# Patient Record
Sex: Male | Born: 1935 | Hispanic: Yes | Marital: Married | Smoking: Never smoker
Health system: Southern US, Community
[De-identification: ages and names within clinical notes are randomized; demographics above are authoritative.]

## PROBLEM LIST (undated history)

## (undated) DIAGNOSIS — K219 Gastro-esophageal reflux disease without esophagitis: Secondary | ICD-10-CM

## (undated) DIAGNOSIS — I471 Supraventricular tachycardia, unspecified: Secondary | ICD-10-CM

## (undated) DIAGNOSIS — K859 Acute pancreatitis without necrosis or infection, unspecified: Secondary | ICD-10-CM

## (undated) DIAGNOSIS — I429 Cardiomyopathy, unspecified: Secondary | ICD-10-CM

## (undated) DIAGNOSIS — I2510 Atherosclerotic heart disease of native coronary artery without angina pectoris: Secondary | ICD-10-CM

## (undated) DIAGNOSIS — I10 Essential (primary) hypertension: Secondary | ICD-10-CM

## (undated) DIAGNOSIS — I77810 Thoracic aortic ectasia: Secondary | ICD-10-CM

## (undated) DIAGNOSIS — I4510 Unspecified right bundle-branch block: Secondary | ICD-10-CM

## (undated) DIAGNOSIS — I714 Abdominal aortic aneurysm, without rupture, unspecified: Secondary | ICD-10-CM

## (undated) DIAGNOSIS — I951 Orthostatic hypotension: Secondary | ICD-10-CM

## (undated) DIAGNOSIS — K8050 Calculus of bile duct without cholangitis or cholecystitis without obstruction: Secondary | ICD-10-CM

## (undated) HISTORY — DX: Unspecified right bundle-branch block: I45.10

## (undated) HISTORY — DX: Abdominal aortic aneurysm, without rupture: I71.4

## (undated) HISTORY — DX: Cardiomyopathy, unspecified: I42.9

## (undated) HISTORY — DX: Calculus of bile duct without cholangitis or cholecystitis without obstruction: K80.50

## (undated) HISTORY — DX: Thoracic aortic ectasia: I77.810

## (undated) HISTORY — DX: Atherosclerotic heart disease of native coronary artery without angina pectoris: I25.10

## (undated) HISTORY — DX: Orthostatic hypotension: I95.1

## (undated) HISTORY — DX: Supraventricular tachycardia, unspecified: I47.10

## (undated) HISTORY — DX: Abdominal aortic aneurysm, without rupture, unspecified: I71.40

## (undated) HISTORY — DX: Acute pancreatitis without necrosis or infection, unspecified: K85.90

## (undated) HISTORY — DX: Supraventricular tachycardia: I47.1

---

## 2021-02-20 ENCOUNTER — Emergency Department
Admission: EM | Admit: 2021-02-20 | Discharge: 2021-02-20 | Disposition: A | Discharge status: Home / Self Care | Payer: Self-pay | Attending: Emergency Medicine | Admitting: Emergency Medicine

## 2021-02-20 ENCOUNTER — Other Ambulatory Visit: Payer: Self-pay

## 2021-02-20 ENCOUNTER — Emergency Department: Payer: Self-pay

## 2021-02-20 DIAGNOSIS — R109 Unspecified abdominal pain: Secondary | ICD-10-CM

## 2021-02-20 DIAGNOSIS — I10 Essential (primary) hypertension: Secondary | ICD-10-CM | POA: Insufficient documentation

## 2021-02-20 DIAGNOSIS — K2970 Gastritis, unspecified, without bleeding: Secondary | ICD-10-CM | POA: Insufficient documentation

## 2021-02-20 HISTORY — DX: Essential (primary) hypertension: I10

## 2021-02-20 LAB — CBC WITH DIFFERENTIAL/PLATELET
Abs Immature Granulocytes: 0.02 10*3/uL (ref 0.00–0.07)
Basophils Absolute: 0 10*3/uL (ref 0.0–0.1)
Basophils Relative: 1 %
Eosinophils Absolute: 0.2 10*3/uL (ref 0.0–0.5)
Eosinophils Relative: 3 %
HCT: 37.9 % — ABNORMAL LOW (ref 39.0–52.0)
Hemoglobin: 13.2 g/dL (ref 13.0–17.0)
Immature Granulocytes: 0 %
Lymphocytes Relative: 29 %
Lymphs Abs: 1.9 10*3/uL (ref 0.7–4.0)
MCH: 29.8 pg (ref 26.0–34.0)
MCHC: 34.8 g/dL (ref 30.0–36.0)
MCV: 85.6 fL (ref 80.0–100.0)
Monocytes Absolute: 0.4 10*3/uL (ref 0.1–1.0)
Monocytes Relative: 6 %
Neutro Abs: 3.9 10*3/uL (ref 1.7–7.7)
Neutrophils Relative %: 61 %
Platelets: 144 10*3/uL — ABNORMAL LOW (ref 150–400)
RBC: 4.43 MIL/uL (ref 4.22–5.81)
RDW: 12.2 % (ref 11.5–15.5)
WBC: 6.4 10*3/uL (ref 4.0–10.5)
nRBC: 0 % (ref 0.0–0.2)

## 2021-02-20 LAB — URINALYSIS, COMPLETE (UACMP) WITH MICROSCOPIC
Bacteria, UA: NONE SEEN
Bilirubin Urine: NEGATIVE
Glucose, UA: NEGATIVE mg/dL
Hgb urine dipstick: NEGATIVE
Ketones, ur: NEGATIVE mg/dL
Leukocytes,Ua: NEGATIVE
Nitrite: NEGATIVE
Protein, ur: NEGATIVE mg/dL
Specific Gravity, Urine: 1.017 (ref 1.005–1.030)
pH: 8 (ref 5.0–8.0)

## 2021-02-20 LAB — COMPREHENSIVE METABOLIC PANEL
ALT: 23 U/L (ref 0–44)
AST: 46 U/L — ABNORMAL HIGH (ref 15–41)
Albumin: 3.5 g/dL (ref 3.5–5.0)
Alkaline Phosphatase: 106 U/L (ref 38–126)
Anion gap: 11 (ref 5–15)
BUN: 21 mg/dL (ref 8–23)
CO2: 24 mmol/L (ref 22–32)
Calcium: 8.6 mg/dL — ABNORMAL LOW (ref 8.9–10.3)
Chloride: 103 mmol/L (ref 98–111)
Creatinine, Ser: 1.17 mg/dL (ref 0.61–1.24)
GFR, Estimated: >60 mL/min (ref >60)
Glucose, Bld: 119 mg/dL — ABNORMAL HIGH (ref 70–99)
Potassium: 3.6 mmol/L (ref 3.5–5.1)
Sodium: 138 mmol/L (ref 135–145)
Total Bilirubin: 0.9 mg/dL (ref 0.3–1.2)
Total Protein: 7.3 g/dL (ref 6.5–8.1)

## 2021-02-20 LAB — LIPASE, BLOOD: Lipase: 80 U/L — ABNORMAL HIGH (ref 11–51)

## 2021-02-20 MED ORDER — SODIUM CHLORIDE 0.9 % IV SOLN: Freq: Once | INTRAVENOUS | Status: AC

## 2021-02-20 MED ORDER — MORPHINE SULFATE (PF) 4 MG/ML IV SOLN
4.0000 mg | Freq: Once | INTRAVENOUS | Status: AC
Start: 2021-02-20 — End: 2021-02-20
  Administered 2021-02-20: 4 mg via INTRAVENOUS
  Filled 2021-02-20: qty 1

## 2021-02-20 MED ORDER — ALUM & MAG HYDROXIDE-SIMETH 200-200-20 MG/5ML PO SUSP
30.0000 mL | Freq: Once | ORAL | Status: AC
  Administered 2021-02-20: 30 mL via ORAL
  Filled 2021-02-20: qty 30

## 2021-02-20 MED ORDER — FAMOTIDINE 20 MG PO TABS: 20.0000 mg | ORAL_TABLET | Freq: Every day | ORAL | 1 refills | Status: DC

## 2021-02-20 MED ORDER — IOHEXOL 300 MG/ML  SOLN
100.0000 mL | Freq: Once | INTRAMUSCULAR | Status: AC | PRN
  Administered 2021-02-20: 100 mL via INTRAVENOUS

## 2021-02-20 MED ORDER — LIDOCAINE VISCOUS HCL 2 % MT SOLN
15.0000 mL | Freq: Once | OROMUCOSAL | Status: AC
  Administered 2021-02-20: 15 mL via ORAL
  Filled 2021-02-20: qty 15

## 2021-02-20 MED ORDER — SUCRALFATE 1 G PO TABS: 1.0000 g | ORAL_TABLET | Freq: Four times a day (QID) | ORAL | 0 refills | Status: DC

## 2021-02-20 NOTE — ED Notes (Signed)
Reports feeling better after GI cocktail. Translator cart taken into room for MD to give update

## 2021-02-20 NOTE — Discharge Instructions (Addendum)
Please seek medical attention for any high fevers, chest pain, shortness of breath, change in behavior, persistent vomiting, bloody stool or any other new or concerning symptoms.  

## 2021-02-20 NOTE — ED Triage Notes (Signed)
Pt BIBA from home for abd pain x 3days. Pt denies N/V. Pt c/o constipation x last week. Pt hx of HTN, pt been out of meds x 1 month because he orders them from [place].

## 2021-02-20 NOTE — ED Notes (Signed)
Report given to Jessica, RN.

## 2021-02-20 NOTE — ED Provider Notes (Signed)
[facility] Emergency Department Provider Note  ____________________________________________   I have reviewed the triage vital signs and the nursing notes.   HISTORY  Chief Complaint Abdominal Pain   History limited by: Language [facility] Interpreter utilized   HPI Travis Gallagher is a 87 y.o. male who presents to the emergency department today because of concern for abdominal pain. The patient states that the pain has been present for the past three days. He states it is located in the upper abdomen. The patient also has felt like he has had worsening constipation as well over the past week. He does drink tea which will help with his constipation. This is something that he has dealt with in the past. The patient denies any vomiting. Denies any fevers.   Records reviewed. Per medical record review patient has a history of hypertension.   Past Medical History:  Diagnosis Date  . Hypertension     There are no problems to display for this patient.   History reviewed. No pertinent surgical history.  Prior to Admission medications   Not on File    Allergies Patient has no known allergies.  History reviewed. No pertinent family history.  Social History Social History   Tobacco Use  . Smoking status: Never Smoker  . Smokeless tobacco: Never Used  Substance Use Topics  . Alcohol use: Not Currently  . Drug use: Not Currently    Review of Systems Constitutional: No fever/chills Eyes: No visual changes. ENT: No sore throat. Cardiovascular: Denies chest pain. Respiratory: Denies shortness of breath. Gastrointestinal: Positive for abdominal pain.  Genitourinary: Negative for dysuria. Musculoskeletal: Negative for back pain. Skin: Negative for rash. Neurological: Negative for headaches, focal weakness or numbness.  ____________________________________________   PHYSICAL EXAM:  VITAL SIGNS: ED Triage Vitals  Enc Vitals  Group     BP 02/20/21 1759 (!) 192/76     Pulse Rate 02/20/21 1759 76     Resp 02/20/21 1759 18     Temp 02/20/21 1759 98.7 F (37.1 C)     Temp Source 02/20/21 1759 Oral     SpO2 02/20/21 1759 97 %     Weight 02/20/21 1756 140 lb (63.5 kg)     Height 02/20/21 1756 5\' 7"  (1.702 m)     Head Circumference --      Peak Flow --      Pain Score 02/20/21 1811 7   Constitutional: Alert and oriented.  Eyes: Conjunctivae are normal.  ENT      Head: Normocephalic and atraumatic.      Nose: No congestion/rhinnorhea.      Mouth/Throat: Mucous membranes are moist.      Neck: No stridor. Hematological/Lymphatic/Immunilogical: No cervical lymphadenopathy. Cardiovascular: Normal rate, regular rhythm.  No murmurs, rubs, or gallops. \ Respiratory: Normal respiratory effort without tachypnea nor retractions. Breath sounds are clear and equal bilaterally. No wheezes/rales/rhonchi. Gastrointestinal: Soft and tender to palpation in the epigastric region.  Genitourinary: Deferred Musculoskeletal: Normal range of motion in all extremities. No lower extremity edema. Neurologic:  Normal speech and language. No gross focal neurologic deficits are appreciated.  Skin:  Skin is warm, dry and intact. No rash noted. Psychiatric: Mood and affect are normal. Speech and behavior are normal. Patient exhibits appropriate insight and judgment.  ____________________________________________    LABS (pertinent positives/negatives)  CBC wbc 6.4, hgb 13.2, plt 144 Lipase 80 CMP wnl except glu 119, ca 8.6, ast 46 UA clear, not consistent with infection ____________________________________________  EKG  I, Phineas Semen, attending physician, personally viewed and interpreted this EKG  EKG Time: 1754 Rate: 79 Rhythm: sinus rhythm Axis: normal Intervals: qtc 483 QRS: LVH, q waves v1, v2 ST changes: no st elevation Impression: abnormal ekg ____________________________________________    RADIOLOGY  CT  abd/pel Findings concerning for gastritis. Bilary duct dilatation.  ____________________________________________   PROCEDURES  Procedures  ____________________________________________   INITIAL IMPRESSION / ASSESSMENT AND PLAN / ED COURSE  Pertinent labs & imaging results that were available during my care of the patient were reviewed by me and considered in my medical decision making (see chart for details).   Patient presented to the emergency department today because of concerns for abdominal pain for the past 3 days.  On exam he had some mild tenderness in the upper abdomen.  Blood work without any concerning leukocytosis, elevation of hepatic function panel.  Mild elevation of lipase.  CT scan does show some findings consistent with gastritis.  Also showed biliary ductal dilatation however given lack of elevation hepatic function panel I doubt that this is an acute finding.  Patient did feel better after GI cocktail.  At this point I do think gastritis likely.  Will plan on discharging home with medications as well as dietary guidelines.  ____________________________________________   FINAL CLINICAL IMPRESSION(S) / ED DIAGNOSES  Final diagnoses:  Abdominal pain, unspecified abdominal location  Gastritis, presence of bleeding unspecified, unspecified chronicity, unspecified gastritis type     Note: This dictation was prepared with Dragon dictation. Any transcriptional errors that result from this process are unintentional     Phineas Semen, MD 02/20/21 [phone]

## 2021-02-21 ENCOUNTER — Emergency Department: Payer: Self-pay

## 2021-02-21 ENCOUNTER — Emergency Department
Admission: EM | Admit: 2021-02-21 | Discharge: 2021-02-22 | Disposition: A | Discharge status: Home / Self Care | Payer: Self-pay | Attending: Emergency Medicine | Admitting: Emergency Medicine

## 2021-02-21 ENCOUNTER — Other Ambulatory Visit: Payer: Self-pay

## 2021-02-21 DIAGNOSIS — R1013 Epigastric pain: Secondary | ICD-10-CM

## 2021-02-21 DIAGNOSIS — R0602 Shortness of breath: Secondary | ICD-10-CM | POA: Insufficient documentation

## 2021-02-21 DIAGNOSIS — Z20822 Contact with and (suspected) exposure to covid-19: Secondary | ICD-10-CM | POA: Insufficient documentation

## 2021-02-21 DIAGNOSIS — K8309 Other cholangitis: Secondary | ICD-10-CM | POA: Insufficient documentation

## 2021-02-21 DIAGNOSIS — A419 Sepsis, unspecified organism: Secondary | ICD-10-CM | POA: Insufficient documentation

## 2021-02-21 DIAGNOSIS — K859 Acute pancreatitis without necrosis or infection, unspecified: Secondary | ICD-10-CM

## 2021-02-21 DIAGNOSIS — I10 Essential (primary) hypertension: Secondary | ICD-10-CM | POA: Insufficient documentation

## 2021-02-21 LAB — CBC WITH DIFFERENTIAL/PLATELET
Abs Immature Granulocytes: 0.04 10*3/uL (ref 0.00–0.07)
Basophils Absolute: 0 10*3/uL (ref 0.0–0.1)
Basophils Relative: 0 %
Eosinophils Absolute: 0 10*3/uL (ref 0.0–0.5)
Eosinophils Relative: 0 %
HCT: 41.4 % (ref 39.0–52.0)
Hemoglobin: 14.4 g/dL (ref 13.0–17.0)
Immature Granulocytes: 0 %
Lymphocytes Relative: 4 %
Lymphs Abs: 0.6 10*3/uL — ABNORMAL LOW (ref 0.7–4.0)
MCH: 29.7 pg (ref 26.0–34.0)
MCHC: 34.8 g/dL (ref 30.0–36.0)
MCV: 85.4 fL (ref 80.0–100.0)
Monocytes Absolute: 0.5 10*3/uL (ref 0.1–1.0)
Monocytes Relative: 4 %
Neutro Abs: 13.4 10*3/uL — ABNORMAL HIGH (ref 1.7–7.7)
Neutrophils Relative %: 92 %
Platelets: 136 10*3/uL — ABNORMAL LOW (ref 150–400)
RBC: 4.85 MIL/uL (ref 4.22–5.81)
RDW: 12.4 % (ref 11.5–15.5)
WBC: 14.7 10*3/uL — ABNORMAL HIGH (ref 4.0–10.5)
nRBC: 0 % (ref 0.0–0.2)

## 2021-02-21 LAB — LIPASE, BLOOD: Lipase: 2952 U/L — ABNORMAL HIGH (ref 11–51)

## 2021-02-21 LAB — COMPREHENSIVE METABOLIC PANEL
ALT: 185 U/L — ABNORMAL HIGH (ref 0–44)
AST: 210 U/L — ABNORMAL HIGH (ref 15–41)
Albumin: 3.5 g/dL (ref 3.5–5.0)
Alkaline Phosphatase: 209 U/L — ABNORMAL HIGH (ref 38–126)
Anion gap: 13 (ref 5–15)
BUN: 21 mg/dL (ref 8–23)
CO2: 19 mmol/L — ABNORMAL LOW (ref 22–32)
Calcium: 8.6 mg/dL — ABNORMAL LOW (ref 8.9–10.3)
Chloride: 105 mmol/L (ref 98–111)
Creatinine, Ser: 1.05 mg/dL (ref 0.61–1.24)
GFR, Estimated: >60 mL/min (ref >60)
Glucose, Bld: 136 mg/dL — ABNORMAL HIGH (ref 70–99)
Potassium: 3.7 mmol/L (ref 3.5–5.1)
Sodium: 137 mmol/L (ref 135–145)
Total Bilirubin: 6.3 mg/dL — ABNORMAL HIGH (ref 0.3–1.2)
Total Protein: 7.7 g/dL (ref 6.5–8.1)

## 2021-02-21 LAB — TROPONIN I (HIGH SENSITIVITY)
Troponin I (High Sensitivity): 29 ng/L — ABNORMAL HIGH (ref <18)
Troponin I (High Sensitivity): 32 ng/L — ABNORMAL HIGH (ref <18)

## 2021-02-21 LAB — RESP PANEL BY RT-PCR (FLU A&B, COVID) ARPGX2
Influenza A by PCR: NEGATIVE
Influenza B by PCR: NEGATIVE
SARS Coronavirus 2 by RT PCR: NEGATIVE

## 2021-02-21 LAB — LACTIC ACID, PLASMA
Lactic Acid, Venous: 2.4 mmol/L (ref 0.5–1.9)
Lactic Acid, Venous: 1.1 mmol/L (ref 0.5–1.9)

## 2021-02-21 MED ORDER — SODIUM CHLORIDE 0.9 % IV BOLUS
1000.0000 mL | Freq: Once | INTRAVENOUS | Status: AC
  Administered 2021-02-21: 1000 mL via INTRAVENOUS

## 2021-02-21 MED ORDER — METRONIDAZOLE IN NACL 5-0.79 MG/ML-% IV SOLN
500.0000 mg | Freq: Once | INTRAVENOUS | Status: AC
  Administered 2021-02-21: 500 mg via INTRAVENOUS
  Filled 2021-02-21: qty 100

## 2021-02-21 MED ORDER — ACETAMINOPHEN 500 MG PO TABS: 1000.0000 mg | ORAL_TABLET | Freq: Once | ORAL | Status: DC

## 2021-02-21 MED ORDER — ACETAMINOPHEN 10 MG/ML IV SOLN
1000.0000 mg | Freq: Four times a day (QID) | INTRAVENOUS | Status: DC
  Administered 2021-02-21: 1000 mg via INTRAVENOUS
  Filled 2021-02-21 (×4): qty 100

## 2021-02-21 MED ORDER — FENTANYL CITRATE (PF) 100 MCG/2ML IJ SOLN
50.0000 ug | Freq: Once | INTRAMUSCULAR | Status: AC
  Administered 2021-02-21: 50 ug via INTRAVENOUS
  Filled 2021-02-21: qty 2

## 2021-02-21 MED ORDER — IOHEXOL 350 MG/ML SOLN
75.0000 mL | Freq: Once | INTRAVENOUS | Status: AC | PRN
  Administered 2021-02-21: 75 mL via INTRAVENOUS

## 2021-02-21 MED ORDER — HYDROMORPHONE HCL 1 MG/ML IJ SOLN
0.5000 mg | Freq: Once | INTRAMUSCULAR | Status: AC
  Administered 2021-02-21: 0.5 mg via INTRAVENOUS
  Filled 2021-02-21: qty 1

## 2021-02-21 MED ORDER — VANCOMYCIN HCL IN DEXTROSE 1-5 GM/200ML-% IV SOLN
1000.0000 mg | Freq: Once | INTRAVENOUS | Status: AC
  Administered 2021-02-21: 1000 mg via INTRAVENOUS
  Filled 2021-02-21: qty 200

## 2021-02-21 MED ORDER — SODIUM CHLORIDE 0.9 % IV SOLN
2.0000 g | Freq: Once | INTRAVENOUS | Status: AC
  Administered 2021-02-21: 2 g via INTRAVENOUS
  Filled 2021-02-21: qty 2

## 2021-02-21 MED ORDER — ONDANSETRON HCL 4 MG/2ML IJ SOLN
4.0000 mg | Freq: Once | INTRAMUSCULAR | Status: AC
  Administered 2021-02-21: 4 mg via INTRAVENOUS
  Filled 2021-02-21: qty 2

## 2021-02-21 NOTE — Progress Notes (Signed)
CODE SEPSIS - PHARMACY COMMUNICATION  **Broad Spectrum Antibiotics should be administered within 1 hour of Sepsis diagnosis**  Time Code Sepsis Called/Page Received: 2150  Antibiotics Ordered: cefepime, vancomycin, metronidazole  Time of 1st antibiotic administration: 2159   Travis Gallagher 02/21/2021  9:51 PM

## 2021-02-21 NOTE — Consult Note (Signed)
PHARMACY -  BRIEF ANTIBIOTIC NOTE   Pharmacy has received consult(s) for cefepime and vancomycin from an ED provider. Patient is also ordered metronidazole. The patient's profile has been reviewed for ht/wt/allergies/indication/available labs.    One time order(s) placed for  --Vancomycin 1 g IV  --Cefepime 2 g IV   Further antibiotics/pharmacy consults should be ordered by admitting physician if indicated.                       Thank you, Tressie Ellis 02/21/2021  9:50 PM

## 2021-02-21 NOTE — ED Triage Notes (Signed)
Pt was seen here yesterday for abd pain and pt states the pain is a lot worse today with radiation into his chest, pt states he also feels short of breath. Pt was d/c home with meds last night but not any better. Pt feels nauseous.

## 2021-02-21 NOTE — ED Notes (Signed)
US at bedside

## 2021-02-21 NOTE — ED Notes (Signed)
2hr repeat lactic drawn and sent to lab at this time

## 2021-02-21 NOTE — ED Notes (Signed)
Pt to xray

## 2021-02-21 NOTE — ED Notes (Signed)
Pt placed on 2L [place] following fentanyl administration when oxygen saturation dipped down to 88 percent on room air. O2 sat increased to 95 on 2L [place].

## 2021-02-21 NOTE — ED Provider Notes (Signed)
Travis Gallagher  ____________________________________________   Event Date/Time   First MD Initiated Contact with Patient 02/21/21 1959     (approximate)  I have reviewed the triage vital signs and the nursing notes.   HISTORY  Chief Complaint Abdominal Pain    HPI Travis Gallagher is a 87 y.o. male with hypertension who comes in with abdominal pain.  Patient reports that the pain in his upper abdomen is getting worse, severe, constant, not better with the medications he was prescribed including ranitidine and Carafate.  Patient does report the pain going up into his chest as well as some shortness of breath with it as well.  Daughter states that on his way here he was shaking and she was not sure that was a reaction to the medication.  She denies that he is shaking any longer.  Patient also reports some shortness of breath associated with the abdominal pain.   On review of records patient was seen yesterday with abdominal pain.  CT scan was concerning for gastritis and also showed some biliary duct dilation however given lack of elevation of his hepatic function panel did not think it was an acute finding.  Patient felt better after GI cocktail and was discharged home.          Past Medical History:  Diagnosis Date  . Hypertension     There are no problems to display for this patient.   No past surgical history on file.  Prior to Admission medications   Medication Sig Start Date End Date Taking? Authorizing Provider  famotidine (PEPCID) 20 MG tablet Take 1 tablet (20 mg total) by mouth daily. 02/20/21 02/20/22  Phineas Semen, MD  sucralfate (CARAFATE) 1 g tablet Take 1 tablet (1 g total) by mouth 4 (four) times daily. 02/20/21   Phineas Semen, MD    Allergies Patient has no known allergies.  No family history on file.  Social History Social History   Tobacco Use  . Smoking status: Never Smoker   . Smokeless tobacco: Never Used  Substance Use Topics  . Alcohol use: Not Currently  . Drug use: Not Currently      Review of Systems Constitutional: Denies known fever Eyes: No visual changes. ENT: No sore throat. Cardiovascular: Positive chest pain Respiratory: Positive shortness of breath Gastrointestinal: Positive abdominal pain no nausea, no vomiting.  No diarrhea.  No constipation. Genitourinary: Negative for dysuria. Musculoskeletal: Negative for back pain. Skin: Negative for rash. Neurological: Negative for headaches, focal weakness or numbness. All other ROS negative ____________________________________________   PHYSICAL EXAM:  VITAL SIGNS: Blood pressure (!) 148/78, pulse (!) 109, temperature 99 F (37.2 C), resp. rate 18, height 5\' 7"  (1.702 m), weight 63.5 kg, SpO2 92 %.   Constitutional: Alert and oriented. Well appearing and in no acute distress. Eyes: Conjunctivae are normal. EOMI. Head: Atraumatic. Nose: No congestion/rhinnorhea. Mouth/Throat: Mucous membranes are moist.   Neck: No stridor. Trachea Midline. FROM Cardiovascular: Normal rate, regular rhythm. Grossly normal heart sounds.  Good peripheral circulation. Respiratory: Normal respiratory effort.  No retractions. Lungs CTAB. Gastrointestinal: Tender throughout his abdomen but mostly in the upper abdomen.. No distention. No abdominal bruits.  Musculoskeletal: No lower extremity tenderness nor edema.  No joint effusions. Neurologic:  Normal speech and language. No gross focal neurologic deficits are appreciated.  Skin:  Skin is warm, dry and intact. No rash noted. Psychiatric: Mood and affect are normal. Speech and behavior are normal. GU: Deferred  ____________________________________________   LABS (all labs ordered are listed, but only abnormal results are displayed)  Labs Reviewed  CBC WITH DIFFERENTIAL/PLATELET - Abnormal; Notable for the following components:      Result Value   WBC  14.7 (*)    Platelets 136 (*)    Neutro Abs 13.4 (*)    Lymphs Abs 0.6 (*)    All other components within normal limits  LACTIC ACID, PLASMA - Abnormal; Notable for the following components:   Lactic Acid, Venous 2.4 (*)    All other components within normal limits  RESP PANEL BY RT-PCR (FLU A&B, COVID) ARPGX2  CULTURE, BLOOD (ROUTINE X 2)  CULTURE, BLOOD (ROUTINE X 2)  URINE CULTURE  COMPREHENSIVE METABOLIC PANEL  LIPASE, BLOOD  PROCALCITONIN  PROCALCITONIN  LACTIC ACID, PLASMA  TROPONIN I (HIGH SENSITIVITY)  TROPONIN I (HIGH SENSITIVITY)   ____________________________________________   ED ECG REPORT I, Concha Se, the attending physician, personally viewed and interpreted this ECG.  Sinus tachycardia rate of 109, no ST elevation, T wave inversion aVL and V2 and V3 with a right bundle branch block ____________________________________________  RADIOLOGY I, Concha Se, personally viewed and evaluated these images (plain radiographs) as part of my medical decision making, as well as reviewing the written report by the radiologist.  ED MD interpretation: Possible left-sided opacity  Official radiology report(s): DG Chest 2 View  Result Date: 02/21/2021 CLINICAL DATA:  Worsening abdominal pain radiating into the chest. EXAM: CHEST - 2 VIEW COMPARISON:  CT abdomen pelvis February 20, 2021 FINDINGS: The heart size and mediastinal contours are within normal limits. Low lung volumes. Streaky left basilar opacities. No visible pleural effusion or pneumothorax. Thoracic spondylosis. Degenerative changes bilateral shoulders. IMPRESSION: Low lung volumes with streaky left basilar opacities, which may represent atelectasis or infiltrate. Electronically Signed   By: Maudry Mayhew MD   On: 02/21/2021 20:44    ____________________________________________   PROCEDURES  Procedure(s) performed (including Critical Care):  .1-3 Lead EKG Interpretation Performed by: Concha Se,  MD Authorized by: Concha Se, MD     Interpretation: abnormal     ECG rate:  100s    ECG rate assessment: tachycardic     Rhythm: sinus tachycardia     Ectopy: none     Conduction: normal   .Critical Care Performed by: Concha Se, MD Authorized by: Concha Se, MD   Critical care provider statement:    Critical care time (minutes):  45   Critical care was necessary to treat or prevent imminent or life-threatening deterioration of the following conditions:  Sepsis   Critical care was time spent personally by me on the following activities:  Discussions with consultants, evaluation of patient's response to treatment, examination of patient, ordering and performing treatments and interventions, ordering and review of laboratory studies, ordering and review of radiographic studies, pulse oximetry, re-evaluation of patient's condition, obtaining history from patient or surrogate and review of old charts     ____________________________________________   INITIAL IMPRESSION / ASSESSMENT AND PLAN / ED COURSE  Kenzie Alverto Sherlon Handing was evaluated in Emergency Department on 02/21/2021 for the symptoms described in the history of present illness. He was evaluated in the context of the global COVID-19 pandemic, which necessitated consideration that the patient might be at risk for infection with the SARS-CoV-2 virus that causes COVID-19. Institutional protocols and algorithms that pertain to the evaluation of patients at risk for COVID-19 are in a state of rapid change based on information  released by regulatory bodies including the CDC and federal and state organizations. These policies and algorithms were followed during the patient's care in the ED.    Patient comes in with tachycardia and possible temperature.  We rechecked his temperature multiple times and has ranged from 99-103 but I suspect he might have a fever given daughter describes what might sound like rigors.  Will get UA to  evaluate for UTI, chest x-ray to evaluate for pneumonia and COVID swab.  Suspect patient will need repeat CT imaging given patient now has fever.  We will give patient IV fluids and Zofran and fentanyl to help with pain.  We will keep patient on the cardiac monitor to evaluate for cardiac respiratory collapse  9:52 PM patient's repeat temperature is now 101.5 therefore sepsis alert was called given patient's elevated white count.  Will start broad-spectrum antibiotics given unclear source at this time.  Lab called stating that his T bili was elevated and this could be potentially the cause of his fever.  Patient had a CT scan that did show a mild dilation of the CBD that was favored to represent benign dilation and and yesterday his liver function tests were normal.  I suspect the patient could have a CBD stone that is causing his symptoms today.  I added on an ultrasound in case this will get done faster than the CT scan.  COVID swab is negative.  Patient was given some IV Dilaudid and IV Tylenol.  Ultrasound now shows 11 mm CBD with gallstones but no signs of choledocholithiasis.  However given the elevated T bili I am concerned about a retained stone.  Discussed the case with Dr. Servando Snare from GI who recommended be transferred to [facility] due to being unable to do ERCP tomorrow.  CT imaging was negative.  Still waiting potential transfer to [facility] for ERCP.  Incidental findings on CT imaging family will follow up with  Pt accepted by Dr. Leafy Half for [facility] transfer         ____________________________________________   FINAL CLINICAL IMPRESSION(S) / ED DIAGNOSES   Final diagnoses:  Epigastric abdominal pain  Sepsis, due to unspecified organism, unspecified whether acute organ dysfunction present (HCC)  Ascending cholangitis      MEDICATIONS GIVEN DURING THIS VISIT:  Medications  ceFEPIme (MAXIPIME) 2 g in sodium chloride 0.9 % 100 mL IVPB (has no administration in time range)   metroNIDAZOLE (FLAGYL) IVPB 500 mg (has no administration in time range)  vancomycin (VANCOCIN) IVPB 1000 mg/200 mL premix (has no administration in time range)  HYDROmorphone (DILAUDID) injection 0.5 mg (has no administration in time range)  acetaminophen (OFIRMEV) IV 1,000 mg (has no administration in time range)  sodium chloride 0.9 % bolus 1,000 mL (has no administration in time range)  sodium chloride 0.9 % bolus 1,000 mL (1,000 mLs Intravenous New Bag/Given 02/21/21 2038)  ondansetron (ZOFRAN) injection 4 mg (4 mg Intravenous Given 02/21/21 2038)  fentaNYL (SUBLIMAZE) injection 50 mcg (50 mcg Intravenous Given 02/21/21 2038)     ED Discharge Orders    None       Gallagher:  This document was prepared using Dragon voice recognition software and may include unintentional dictation errors.   Concha Se, MD 02/21/21 [phone]

## 2021-02-22 ENCOUNTER — Inpatient Hospital Stay (HOSPITAL_COMMUNITY)
Admission: AD | Admit: 2021-02-22 | Discharge: 2021-03-14 | DRG: 853 | Disposition: A | Discharge status: Home / Self Care | Payer: Self-pay | Source: Other Acute Inpatient Hospital | Attending: Internal Medicine | Admitting: Internal Medicine

## 2021-02-22 ENCOUNTER — Encounter (HOSPITAL_COMMUNITY): Payer: Self-pay | Admitting: Internal Medicine

## 2021-02-22 ENCOUNTER — Inpatient Hospital Stay (HOSPITAL_COMMUNITY): Payer: Self-pay

## 2021-02-22 DIAGNOSIS — R5381 Other malaise: Secondary | ICD-10-CM | POA: Diagnosis present

## 2021-02-22 DIAGNOSIS — A419 Sepsis, unspecified organism: Secondary | ICD-10-CM

## 2021-02-22 DIAGNOSIS — I5043 Acute on chronic combined systolic (congestive) and diastolic (congestive) heart failure: Secondary | ICD-10-CM | POA: Diagnosis present

## 2021-02-22 DIAGNOSIS — E8809 Other disorders of plasma-protein metabolism, not elsewhere classified: Secondary | ICD-10-CM | POA: Diagnosis present

## 2021-02-22 DIAGNOSIS — Z9889 Other specified postprocedural states: Secondary | ICD-10-CM

## 2021-02-22 DIAGNOSIS — J9811 Atelectasis: Secondary | ICD-10-CM | POA: Diagnosis not present

## 2021-02-22 DIAGNOSIS — R778 Other specified abnormalities of plasma proteins: Secondary | ICD-10-CM | POA: Diagnosis present

## 2021-02-22 DIAGNOSIS — Z79899 Other long term (current) drug therapy: Secondary | ICD-10-CM

## 2021-02-22 DIAGNOSIS — E43 Unspecified severe protein-calorie malnutrition: Secondary | ICD-10-CM | POA: Diagnosis present

## 2021-02-22 DIAGNOSIS — K9171 Accidental puncture and laceration of a digestive system organ or structure during a digestive system procedure: Secondary | ICD-10-CM | POA: Diagnosis not present

## 2021-02-22 DIAGNOSIS — I429 Cardiomyopathy, unspecified: Secondary | ICD-10-CM | POA: Diagnosis present

## 2021-02-22 DIAGNOSIS — K7460 Unspecified cirrhosis of liver: Secondary | ICD-10-CM | POA: Diagnosis present

## 2021-02-22 DIAGNOSIS — R109 Unspecified abdominal pain: Secondary | ICD-10-CM | POA: Diagnosis present

## 2021-02-22 DIAGNOSIS — I10 Essential (primary) hypertension: Secondary | ICD-10-CM | POA: Diagnosis present

## 2021-02-22 DIAGNOSIS — R6520 Severe sepsis without septic shock: Secondary | ICD-10-CM | POA: Diagnosis present

## 2021-02-22 DIAGNOSIS — I77810 Thoracic aortic ectasia: Secondary | ICD-10-CM | POA: Diagnosis present

## 2021-02-22 DIAGNOSIS — R7989 Other specified abnormal findings of blood chemistry: Secondary | ICD-10-CM | POA: Diagnosis present

## 2021-02-22 DIAGNOSIS — K82A1 Gangrene of gallbladder in cholecystitis: Secondary | ICD-10-CM | POA: Diagnosis present

## 2021-02-22 DIAGNOSIS — R110 Nausea: Secondary | ICD-10-CM | POA: Diagnosis present

## 2021-02-22 DIAGNOSIS — Z20822 Contact with and (suspected) exposure to covid-19: Secondary | ICD-10-CM | POA: Diagnosis present

## 2021-02-22 DIAGNOSIS — K851 Biliary acute pancreatitis without necrosis or infection: Secondary | ICD-10-CM | POA: Diagnosis present

## 2021-02-22 DIAGNOSIS — Y838 Other surgical procedures as the cause of abnormal reaction of the patient, or of later complication, without mention of misadventure at the time of the procedure: Secondary | ICD-10-CM | POA: Diagnosis not present

## 2021-02-22 DIAGNOSIS — J439 Emphysema, unspecified: Secondary | ICD-10-CM | POA: Diagnosis present

## 2021-02-22 DIAGNOSIS — I471 Supraventricular tachycardia: Secondary | ICD-10-CM | POA: Diagnosis not present

## 2021-02-22 DIAGNOSIS — I714 Abdominal aortic aneurysm, without rupture: Secondary | ICD-10-CM | POA: Diagnosis present

## 2021-02-22 DIAGNOSIS — I4510 Unspecified right bundle-branch block: Secondary | ICD-10-CM | POA: Diagnosis present

## 2021-02-22 DIAGNOSIS — R935 Abnormal findings on diagnostic imaging of other abdominal regions, including retroperitoneum: Secondary | ICD-10-CM

## 2021-02-22 DIAGNOSIS — K8309 Other cholangitis: Secondary | ICD-10-CM | POA: Diagnosis present

## 2021-02-22 DIAGNOSIS — Z87891 Personal history of nicotine dependence: Secondary | ICD-10-CM

## 2021-02-22 DIAGNOSIS — I110 Hypertensive heart disease with heart failure: Secondary | ICD-10-CM | POA: Diagnosis present

## 2021-02-22 DIAGNOSIS — K219 Gastro-esophageal reflux disease without esophagitis: Secondary | ICD-10-CM | POA: Diagnosis present

## 2021-02-22 DIAGNOSIS — N179 Acute kidney failure, unspecified: Secondary | ICD-10-CM | POA: Diagnosis not present

## 2021-02-22 DIAGNOSIS — K8020 Calculus of gallbladder without cholecystitis without obstruction: Secondary | ICD-10-CM

## 2021-02-22 DIAGNOSIS — K859 Acute pancreatitis without necrosis or infection, unspecified: Secondary | ICD-10-CM

## 2021-02-22 DIAGNOSIS — R140 Abdominal distension (gaseous): Secondary | ICD-10-CM

## 2021-02-22 DIAGNOSIS — I340 Nonrheumatic mitral (valve) insufficiency: Secondary | ICD-10-CM | POA: Diagnosis present

## 2021-02-22 DIAGNOSIS — H5462 Unqualified visual loss, left eye, normal vision right eye: Secondary | ICD-10-CM | POA: Diagnosis present

## 2021-02-22 DIAGNOSIS — Z6821 Body mass index (BMI) 21.0-21.9, adult: Secondary | ICD-10-CM

## 2021-02-22 DIAGNOSIS — Q446 Cystic disease of liver: Secondary | ICD-10-CM

## 2021-02-22 DIAGNOSIS — I723 Aneurysm of iliac artery: Secondary | ICD-10-CM | POA: Diagnosis present

## 2021-02-22 DIAGNOSIS — R0902 Hypoxemia: Secondary | ICD-10-CM | POA: Diagnosis present

## 2021-02-22 DIAGNOSIS — L03311 Cellulitis of abdominal wall: Secondary | ICD-10-CM | POA: Diagnosis not present

## 2021-02-22 DIAGNOSIS — K631 Perforation of intestine (nontraumatic): Secondary | ICD-10-CM

## 2021-02-22 DIAGNOSIS — K567 Ileus, unspecified: Secondary | ICD-10-CM

## 2021-02-22 DIAGNOSIS — A4151 Sepsis due to Escherichia coli [E. coli]: Principal | ICD-10-CM | POA: Diagnosis present

## 2021-02-22 HISTORY — DX: Gastro-esophageal reflux disease without esophagitis: K21.9

## 2021-02-22 LAB — BLOOD CULTURE ID PANEL (REFLEXED) - BCID2

## 2021-02-22 LAB — TROPONIN I (HIGH SENSITIVITY)
Troponin I (High Sensitivity): 174 ng/L (ref <18)
Troponin I (High Sensitivity): 112 ng/L (ref <18)

## 2021-02-22 LAB — COMPREHENSIVE METABOLIC PANEL
ALT: 125 U/L — ABNORMAL HIGH (ref 0–44)
AST: 102 U/L — ABNORMAL HIGH (ref 15–41)
Albumin: 2.8 g/dL — ABNORMAL LOW (ref 3.5–5.0)
Alkaline Phosphatase: 178 U/L — ABNORMAL HIGH (ref 38–126)
Anion gap: 7 (ref 5–15)
BUN: 18 mg/dL (ref 8–23)
CO2: 24 mmol/L (ref 22–32)
Calcium: 8 mg/dL — ABNORMAL LOW (ref 8.9–10.3)
Chloride: 107 mmol/L (ref 98–111)
Creatinine, Ser: 1.25 mg/dL — ABNORMAL HIGH (ref 0.61–1.24)
GFR, Estimated: 56 mL/min — ABNORMAL LOW (ref >60)
Glucose, Bld: 90 mg/dL (ref 70–99)
Potassium: 3.9 mmol/L (ref 3.5–5.1)
Sodium: 138 mmol/L (ref 135–145)
Total Bilirubin: 4.7 mg/dL — ABNORMAL HIGH (ref 0.3–1.2)
Total Protein: 6.1 g/dL — ABNORMAL LOW (ref 6.5–8.1)

## 2021-02-22 LAB — CBC WITH DIFFERENTIAL/PLATELET
Abs Immature Granulocytes: 0.04 10*3/uL (ref 0.00–0.07)
Basophils Absolute: 0 10*3/uL (ref 0.0–0.1)
Basophils Relative: 0 %
Eosinophils Absolute: 0 10*3/uL (ref 0.0–0.5)
Eosinophils Relative: 0 %
HCT: 38 % — ABNORMAL LOW (ref 39.0–52.0)
Hemoglobin: 12.7 g/dL — ABNORMAL LOW (ref 13.0–17.0)
Immature Granulocytes: 0 %
Lymphocytes Relative: 5 %
Lymphs Abs: 0.5 10*3/uL — ABNORMAL LOW (ref 0.7–4.0)
MCH: 29.8 pg (ref 26.0–34.0)
MCHC: 33.4 g/dL (ref 30.0–36.0)
MCV: 89.2 fL (ref 80.0–100.0)
Monocytes Absolute: 0.4 10*3/uL (ref 0.1–1.0)
Monocytes Relative: 4 %
Neutro Abs: 8.4 10*3/uL — ABNORMAL HIGH (ref 1.7–7.7)
Neutrophils Relative %: 91 %
Platelets: 95 10*3/uL — ABNORMAL LOW (ref 150–400)
RBC: 4.26 MIL/uL (ref 4.22–5.81)
RDW: 12.9 % (ref 11.5–15.5)
WBC: 9.4 10*3/uL (ref 4.0–10.5)
nRBC: 0 % (ref 0.0–0.2)

## 2021-02-22 LAB — GAMMA GT: GGT: 240 U/L — ABNORMAL HIGH (ref 7–50)

## 2021-02-22 LAB — BILIRUBIN, DIRECT: Bilirubin, Direct: 2.8 mg/dL — ABNORMAL HIGH (ref 0.0–0.2)

## 2021-02-22 LAB — TRIGLYCERIDES: Triglycerides: 35 mg/dL (ref <150)

## 2021-02-22 LAB — PROCALCITONIN: Procalcitonin: 0.96 ng/mL

## 2021-02-22 LAB — MAGNESIUM: Magnesium: 1.9 mg/dL (ref 1.7–2.4)

## 2021-02-22 MED ORDER — METRONIDAZOLE IN NACL 5-0.79 MG/ML-% IV SOLN
500.0000 mg | Freq: Three times a day (TID) | INTRAVENOUS | Status: DC
  Administered 2021-02-22 – 2021-02-26 (×12): 500 mg via INTRAVENOUS
  Filled 2021-02-22 (×16): qty 100

## 2021-02-22 MED ORDER — GADOBUTROL 1 MMOL/ML IV SOLN
6.5000 mL | Freq: Once | INTRAVENOUS | Status: AC | PRN
  Administered 2021-02-22: 6.5 mL via INTRAVENOUS

## 2021-02-22 MED ORDER — ACETAMINOPHEN 325 MG PO TABS
650.0000 mg | ORAL_TABLET | Freq: Four times a day (QID) | ORAL | Status: DC | PRN
  Administered 2021-02-22 – 2021-02-28 (×8): 650 mg via ORAL
  Filled 2021-02-22 (×10): qty 2

## 2021-02-22 MED ORDER — SODIUM CHLORIDE 0.9 % IV SOLN
2.0000 g | Freq: Every day | INTRAVENOUS | Status: DC
  Administered 2021-02-23 – 2021-02-28 (×7): 2 g via INTRAVENOUS
  Filled 2021-02-22: qty 20
  Filled 2021-02-22: qty 2
  Filled 2021-02-22 (×2): qty 20
  Filled 2021-02-22 (×2): qty 2
  Filled 2021-02-22: qty 20
  Filled 2021-02-22 (×2): qty 2

## 2021-02-22 MED ORDER — SODIUM CHLORIDE 0.9 % IV SOLN
2.0000 g | Freq: Once | INTRAVENOUS | Status: AC
  Administered 2021-02-22: 2 g via INTRAVENOUS
  Filled 2021-02-22: qty 2

## 2021-02-22 MED ORDER — MORPHINE SULFATE (PF) 4 MG/ML IV SOLN
4.0000 mg | INTRAVENOUS | Status: DC | PRN
  Administered 2021-02-22 (×2): 4 mg via INTRAVENOUS
  Filled 2021-02-22 (×2): qty 1

## 2021-02-22 MED ORDER — FENTANYL CITRATE (PF) 100 MCG/2ML IJ SOLN
25.0000 ug | INTRAMUSCULAR | Status: DC | PRN
  Administered 2021-02-24: 25 ug via INTRAVENOUS
  Filled 2021-02-22: qty 2

## 2021-02-22 MED ORDER — PANTOPRAZOLE SODIUM 40 MG IV SOLR
40.0000 mg | INTRAVENOUS | Status: DC
  Administered 2021-02-22 – 2021-02-27 (×6): 40 mg via INTRAVENOUS
  Filled 2021-02-22 (×6): qty 40

## 2021-02-22 MED ORDER — ONDANSETRON HCL 4 MG/2ML IJ SOLN
4.0000 mg | Freq: Four times a day (QID) | INTRAMUSCULAR | Status: DC | PRN
  Filled 2021-02-22 (×2): qty 2

## 2021-02-22 MED ORDER — ACETAMINOPHEN 650 MG RE SUPP: 650.0000 mg | Freq: Four times a day (QID) | RECTAL | Status: DC | PRN

## 2021-02-22 MED ORDER — METRONIDAZOLE IN NACL 5-0.79 MG/ML-% IV SOLN
500.0000 mg | Freq: Once | INTRAVENOUS | Status: AC
  Administered 2021-02-22: 500 mg via INTRAVENOUS
  Filled 2021-02-22: qty 100

## 2021-02-22 MED ORDER — LACTATED RINGERS IV SOLN
INTRAVENOUS | Status: DC
  Administered 2021-02-22: 950 mL via INTRAVENOUS
  Administered 2021-02-23: 1000 mL via INTRAVENOUS

## 2021-02-22 NOTE — ED Notes (Addendum)
[facility] at bedside to transport pt at this time 

## 2021-02-22 NOTE — ED Notes (Signed)
Report given to [facility] at this time. Per [facility] will be here in 20-25 mins

## 2021-02-22 NOTE — ED Notes (Signed)
Pt reports continued pain across BLQ, reports improving with PRN morphine. Pt and pt's daughter at bedside aware of need for transfer and that we are waiting for [facility] to contact us when a room is available to transfer him to. Pt and pt's daughter deny any needs or questions at this time. Pt remains on 2L O2 d/t desating following any IV narcotic administration.

## 2021-02-22 NOTE — ED Notes (Signed)
Report given to Anabelle RN at [facility] at this time

## 2021-02-22 NOTE — H&P (Signed)
History and Physical    PLEASE NOTE THAT DRAGON DICTATION SOFTWARE WAS USED IN THE CONSTRUCTION OF THIS NOTE.   Coal Nearhood LZJ:[id number] DOB: 15-Mar-1931 DOA: 02/22/2021  PCP: Pcp, No Patient coming from: home   I have personally briefly reviewed patient's old medical records in [place]  Chief Complaint: abdominal pain  HPI: Travis Gallagher is a 87 y.o. male with medical history significant for hypertension, GERD who is admitted to [facility] by way of transfer from [facility] ED for further evaluation and management of acute pancreatitis in setting of potential choledocholithiasis after presenting from home to [facility] ED complaining of abdominal pain.   Of note, this patient is spanish-speaking predominant, and I used a the interpreter service throughout my discussions with the patient.   The patient originally presented to [facility] ED on 02/20/2021 complaining of abdominal discomfort.  At that time, labs demonstrated no evidence of transaminitis or cholestasis, and CT abdomen/pelvis showed mild dilation of the common bile duct to 10 mm, but otherwise, showed no evidence of acute intra-abdominal process, including no evidence of choledocholithiasis , pancreatitis, or acute cholecystitis.  The patient was managed symptomatically in the ED, and subsequent discharged home.  In the interval, he presented back to [facility] ED on 02/21/2021 complaining of interval worsening of his abdominal pain.  At that time, repeat CMP demonstrated interval development of transaminitis with AST 210, compared to 46 on 02/20/21, ALT 185 compared 23 on 4/25/222, elevated alkaline phosphatase of 209, compared to 106 on 02/20/21, as well as interval development of hyperbilirubinemia with total bilirubin of 6.3 to 0.9 on 02/20/21.  Additionally, repeat lipase showed 3000, compared to 80 on the day prior.  CBC notable for interval development of leukocytosis, as further quantified below.  Vital signs  in [name] ED on 02/21/21 were notable for the following: Temperature max one 1.5, heart rate 63-109; blood pressure 115/59 -176/48; respiratory rate 13-20, oxygen saturation 93 to 100% on room air.   Additional labs on 02/21/21 were notable for the following: High-sensitivity troponin I initially noted to be 29, with repeat value showing slight trend up to 32.  CBC notable for white blood cell count of 14,700 with 92% neutrophils.  Initial lactic acid 2.4, with repeat found to be 1.1.  Urinalysis notable for no white blood cells, no bacteria, nitrate negative, leukocyte esterase negative.  Screening nasopharyngeal COVID-19 PCR found to be negative.  Blood cultures x2 were collected prior to initiation of antibiotics.   Chest x-ray showed low lung volumes with left basilar opacity suggestive of atelectasis versus infiltrate.  CTA chest showed no evidence of acute pulmonary embolism, infiltrate, or edema.  Abdominal ultrasound showed cholelithiasis without evidence of acute cholecystitis, and moderate extrahepatic bile duct dilation to 11 mm, without evidence of overt choledocholithiasis.  CT abdomen/pelvis showed peripancreatic stranding consistent with acute pancreatitis without evidence of abscess or necrosis, while showing stable dilation of the common bile duct 10 mm, similar to CT abdomen/pelvis performed on 02/20/21.  EKG showed sinus tachycardia, with heart rate 109, right bundle branch block, nonspecific T wave inversion in aVR, v1-v3, without evidence of ST changes, including no evidence of ST elevation.  Case was discussed with the on-call gastroenterologist at [facility] (Dr. Allen Norris) who conveyed that choledocholithiasis should be considered recommended transfer to [facility] for gastroenterology consultation and consideration for ERCP.   While at [facility] ED, the following were administered: Cefepime x1 dose, Flagyl x1 dose, IV vancomycin x1, Dilaudid 0.5 mg IV  x1, fentanyl 50 mcg IV x1, Zofran 4 mg IV x1, normal  saline times 2L bolus, and acetaminophen 1 g IV x1.  Subsequently, the patient was transferred from [facility] ED to [facility] for further evaluation management of acute pancreatitis in the setting of concern for choledocholithiasis and developing acute cholangitis.   Upon arrival at [facility], the patient conveys to me that he has been experiencing intermittent progressive abdominal pain over the last 3 to 4 days.  He reports the pain is sharp in nature, and most prominent in the bilateral upper abdominal quadrants, without radiation from these points.  He notes exacerbation of this discomfort with palpation over the abdomen.  Denies any recent/preceding trauma.  He also reports associated intermittent nausea in the absence of any vomiting.  Denies any recent diarrhea, melena, or hematochezia.  He also reports subjective fever over the last 1 to 2 days, in the absence of any chills, fully rigors, or generalized myalgias.  He also denies any recent headache, neck stiffness, rhinitis, rhinorrhea, sore throat, sob, wheezing, cough, or rash.  No recent dysuria, gross hematuria, or change in urinary urgency/frequency.  The patient denies recent chest pain, diaphoresis, palpitations, dizziness, presyncope, or syncope.  He denies any known prior history of acute pancreatitis.  Denies any history of alcohol abuse, or any recent alcohol consumption.      Review of Systems: As per HPI otherwise 10 point review of systems negative.   Past Medical History:  Diagnosis Date  . GERD (gastroesophageal reflux disease)   . Hypertension     Surgical Hx: (denies any prior surgical procedures)   Social History:  reports that he has never smoked. He has never used smokeless tobacco. He reports previous alcohol use. He reports previous drug use.Denies any alcohol consumption over last several months.    No Known Allergies  Family History: Family history reviewed and not pertinent, including no none family history of  malignancy.    Prior to Admission medications   Medication Sig Start Date End Date Taking? Authorizing Provider  famotidine (PEPCID) 20 MG tablet Take 1 tablet (20 mg total) by mouth daily. 02/20/21 02/20/22  Nance Pear, MD  sucralfate (CARAFATE) 1 g tablet Take 1 tablet (1 g total) by mouth 4 (four) times daily. 02/20/21   Nance Pear, MD     Objective    Physical Exam: There were no vitals filed for this visit.  General: appears to be stated age; alert, oriented Skin: warm, dry, no rash Head:  AT/[place] Mouth:  Oral mucosa membranes appear dry, normal dentition Neck: supple; trachea midline Heart:  RRR; did not appreciate any M/R/G Lungs: CTAB, did not appreciate any wheezes, rales, or rhonchi Abdomen: + BS; soft, ND, mild tenderness to palpation over the bilateral upper quadrants, in the absence of any associated guarding, rigidity, or rebound tenderness. Vascular: 2+ pedal pulses b/l; 2+ radial pulses b/l Extremities: no peripheral edema, no muscle wasting Neuro: strength and sensation intact in upper and lower extremities b/l     Labs on Admission: I have personally reviewed following labs and imaging studies  CBC: Recent Labs  Lab 02/20/21 1803 02/21/21 2021  WBC 6.4 14.7*  NEUTROABS 3.9 13.4*  HGB 13.2 14.4  HCT 37.9* 41.4  MCV 85.6 85.4  PLT 144* 093*   Basic Metabolic Panel: Recent Labs  Lab 02/20/21 1803 02/21/21 2021  NA 138 137  K 3.6 3.7  CL 103 105  CO2 24 19*  GLUCOSE 119* 136*  BUN 21  21  CREATININE 1.17 1.05  CALCIUM 8.6* 8.6*   GFR: Estimated Creatinine Clearance: 44.5 mL/min (by C-G formula based on SCr of 1.05 mg/dL). Liver Function Tests: Recent Labs  Lab 02/20/21 1803 02/21/21 2021  AST 46* 210*  ALT 23 185*  ALKPHOS 106 209*  BILITOT 0.9 6.3*  PROT 7.3 7.7  ALBUMIN 3.5 3.5   Recent Labs  Lab 02/20/21 1803 02/21/21 2021  LIPASE 80* 2,952*   No results for input(s): AMMONIA in the last 168 hours. Coagulation  Profile: No results for input(s): INR, PROTIME in the last 168 hours. Cardiac Enzymes: No results for input(s): CKTOTAL, CKMB, CKMBINDEX, TROPONINI in the last 168 hours. BNP (last 3 results) No results for input(s): PROBNP in the last 8760 hours. HbA1C: No results for input(s): HGBA1C in the last 72 hours. CBG: No results for input(s): GLUCAP in the last 168 hours. Lipid Profile: No results for input(s): CHOL, HDL, LDLCALC, TRIG, CHOLHDL, LDLDIRECT in the last 72 hours. Thyroid Function Tests: No results for input(s): TSH, T4TOTAL, FREET4, T3FREE, THYROIDAB in the last 72 hours. Anemia Panel: No results for input(s): VITAMINB12, FOLATE, FERRITIN, TIBC, IRON, RETICCTPCT in the last 72 hours. Urine analysis:    Component Value Date/Time   COLORURINE YELLOW (A) 02/20/2021 2030   [place] (A) 02/20/2021 2030   LABSPEC 1.017 02/20/2021 2030   PHURINE 8.0 02/20/2021 2030   GLUCOSEU NEGATIVE 02/20/2021 2030   [place] NEGATIVE 02/20/2021 2030   [place] NEGATIVE 02/20/2021 2030   [place] 02/20/2021 2030   PROTEINUR NEGATIVE 02/20/2021 2030   NITRITE NEGATIVE 02/20/2021 2030   LEUKOCYTESUR NEGATIVE 02/20/2021 2030    Radiological Exams on Admission: DG Chest 2 View  Result Date: 02/21/2021 CLINICAL DATA:  Worsening abdominal pain radiating into the chest. EXAM: CHEST - 2 VIEW COMPARISON:  CT abdomen pelvis February 20, 2021 FINDINGS: The heart size and mediastinal contours are within normal limits. Low lung volumes. Streaky left basilar opacities. No visible pleural effusion or pneumothorax. Thoracic spondylosis. Degenerative changes bilateral shoulders. IMPRESSION: Low lung volumes with streaky left basilar opacities, which may represent atelectasis or infiltrate. Electronically Signed   By: Dahlia Bailiff MD   On: 02/21/2021 20:44   CT Angio Chest PE W and/or Wo Contrast  Result Date: 02/21/2021 CLINICAL DATA:  Worsening abdominal pain radiating to the chest. EXAM:  CT ANGIOGRAPHY CHEST WITH CONTRAST TECHNIQUE: Multidetector CT imaging of the chest was performed using the standard protocol during bolus administration of intravenous contrast. Multiplanar CT image reconstructions and MIPs were obtained to evaluate the vascular anatomy. CONTRAST:  41m OMNIPAQUE IOHEXOL 350 MG/ML SOLN COMPARISON:  CT abdomen pelvis February 20, 2021. FINDINGS: Cardiovascular: Satisfactory opacification of the pulmonary arteries to the segmental level. No evidence of pulmonary embolism. Aortic atherosclerosis. The ascending aorta measures 3.4 cm in maximal diameter. There are few areas of focal enlargement involving the thoracic aorta 1 in the aortic arch measures 3.6 cm in maximal diameter and another in the descending aorta measures 3.6 cm in maximal diameter. The heart size. No pericardial effusion. Mediastinum/Nodes: No enlarged mediastinal, hilar, or axillary lymph nodes. Thyroid gland, trachea, and esophagus demonstrate no significant findings. Lungs/Pleura: Bibasilar atelectasis. Left upper lobe calcified granuloma. Mild emphysematous change. No pleural effusion. No pneumothorax. Upper Abdomen: Polycystic liver disease. Distended gallbladder without signs gallbladder inflammation. 2.9 cm cyst in the spleen. Musculoskeletal: Multilevel degenerative changes spine. No acute osseous abnormality. Review of the MIP images confirms the above findings. IMPRESSION: 1. No evidence of pulmonary embolism. 2.  Focal areas of aortic ectasia involving the thoracic aorta, involving the aortic arch and descending aorta both of which measure 3.6 cm in maximal diameter. Recommend annual imaging followup by CTA or MRA. This recommendation follows 2010 ACCF/AHA/AATS/ACR/ASA/SCA/SCAI/SIR/STS/SVM Guidelines for the Diagnosis and Management of Patients with Thoracic Aortic Disease. Circulation.2010; 121: T597-C163. Aortic aneurysm NOS (ICD10-I71.9) 3. Polycystic liver disease. 4. Distended gallbladder without signs  gallbladder inflammation. 5. Emphysema and aortic atherosclerosis. Aortic Atherosclerosis (ICD10-I70.0) and Emphysema (ICD10-J43.9). Electronically Signed   By: Dahlia Bailiff MD   On: 02/21/2021 23:43   CT ABDOMEN PELVIS W CONTRAST  Result Date: 02/21/2021 CLINICAL DATA:  Persistent abdominal pain EXAM: CT ABDOMEN AND PELVIS WITH CONTRAST TECHNIQUE: Multidetector CT imaging of the abdomen and pelvis was performed using the standard protocol following bolus administration of intravenous contrast. CONTRAST:  49m OMNIPAQUE IOHEXOL 350 MG/ML SOLN COMPARISON:  February 20, 2021 and same-day ultrasound FINDINGS: Lower chest: Bibasilar atelectasis. Hepatobiliary: Numerous hepatic cysts consistent with polycystic liver disease. Gallbladder is distended without findings of acute inflammation. Similar dilation of the common duct measuring up to 10 mm Pancreas: Peripancreatic stranding. No pancreatic ductal dilation. No walled off collection. Spleen: 2. 9 cm cyst in the spleen Adrenals/Urinary Tract: Adrenal glands are unremarkable. Kidneys are normal, without renal calculi, focal lesion, or hydronephrosis. Bladder is unremarkable. Stomach/Bowel: Similar sub mucosal edema in the proximal stomach. No pathologically dilated loops of small bowel. Normal appendix. No suspicious colonic wall thickening or mass like lesions. Vascular/Lymphatic: The portal vein, splenic vein and SMV are patent. Aortic atherosclerosis. Infrarenal abdominal aortic aneurysm measuring 3 cm on image 40/2. 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 No pathologically enlarged abdominal or pelvic lymph nodes. Reproductive: Mild prostatic enlargement. Other: Peripancreatic fluid. No abdominal ascites or walled off fluid collections. No pneumoperitoneum. Musculoskeletal: Multilevel degenerative changes spine. No acute osseous abnormality. IMPRESSION: 1. Peripancreatic stranding most consistent with acute interstitial  pancreatitis, recommend correlation with serum lipase. No walled off fluid collections or evidence of pancreatic necrosis. 2. Similar submucosal edema in the proximal stomach suggestive of gastritis. 3. Stable dilation of the common duct measuring up to 10 mm, favored to represent benign senescent dilation. 4. 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 5. Infrarenal abdominal aortic aneurysm measuring 3 cm. Recommend follow-up ultrasound every 3 years. This recommendation follows ACR consensus guidelines: White Paper of the ACR Incidental Findings Committee II on Vascular Findings. J Am Coll Radiol 2013; 10:789-794. 6. Aortic atherosclerosis.  Aortic Atherosclerosis (ICD10-I70.0). Electronically Signed   By: JDahlia BailiffMD   On: 02/21/2021 23:53   CT ABDOMEN PELVIS W CONTRAST  Result Date: 02/20/2021 CLINICAL DATA:  RIGHT-sided abdominal pain EXAM: CT ABDOMEN AND PELVIS WITH CONTRAST TECHNIQUE: Multidetector CT imaging of the abdomen and pelvis was performed using the standard protocol following bolus administration of intravenous contrast. CONTRAST:  103mOMNIPAQUE IOHEXOL 300 MG/ML  SOLN COMPARISON:  None. FINDINGS: Lower chest: Lung bases are clear. Hepatobiliary: Multiple round simple fluid cysts of varying size throughout the liver parenchyma. No biliary duct dilatation. Common bile duct is mildly dilated 9 mm. No obstructing lesion. Pancreas: Pancreas is normal. No ductal dilatation. No pancreatic inflammation. Spleen: Cyst within the medial aspect of the spleen measures 2.7 cm. Adrenals/urinary tract: Adrenal glands and kidneys are normal. The ureters and bladder normal. Stomach/Bowel: Mild submucosal thickening in the gastric fundus and cardia (image 27/series 2 and image 74/series 6). The gastric body and antrum appear normal. Small bowel is  normal. No evidence of bowel obstruction or inflammation. Appendix normal. The colon and rectosigmoid colon are normal.  Vascular/Lymphatic: Calcification abdominal aorta. 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 No lymphadenopathy Reproductive: . prostate unremarkable. Other: No free fluid. Musculoskeletal: No aggressive osseous lesion. IMPRESSION: 1. Submucosal edema in the proximal stomach. Findings suggest gastritis. 2. Multiple benign cysts within liver. 3. Mild dilatation of common bile duct is favored benign senescent dilatation. Electronically Signed   By: Suzy Bouchard M.D.   On: 02/20/2021 19:57   US ABDOMEN LIMITED RUQ (LIVER/GB)  Result Date: 02/21/2021 CLINICAL DATA:  Epigastric pain since yesterday. EXAM: ULTRASOUND ABDOMEN LIMITED RIGHT UPPER QUADRANT COMPARISON:  CT abdomen and pelvis 02/20/2021 FINDINGS: Gallbladder: Cholelithiasis with several small stones demonstrated in the gallbladder. No gallbladder wall thickening or edema. Murphy's sign is normal. Common bile duct: Diameter: 11 mm, dilated.  Similar to prior CT. Liver: Numerous cysts throughout the liver consistent with polycystic liver disease. No solid masses identified. Portal vein is patent on color Doppler imaging with normal direction of blood flow towards the liver. Other: None. IMPRESSION: Cholelithiasis without evidence of acute cholecystitis. Polycystic liver disease. Moderate extrahepatic bile duct dilatation, cause not determined. Electronically Signed   By: Lucienne Capers M.D.   On: 02/21/2021 23:07     EKG: Independently reviewed, with result as described above.    Assessment/Plan   Travis Gallagher is a 87 y.o. male with medical history significant for hypertension, GERD who is admitted to [facility] by way of transfer from [facility] ED for further evaluation and management of acute pancreatitis in setting of potential choledocholithiasis after presenting from home to [facility] ED complaining of abdominal pain.    Principal Problem:   Acute pancreatitis Active Problems:   Severe sepsis (HCC)    Cholangitis   Abdominal pain   Nausea   Hypertension   GERD (gastroesophageal reflux disease)   Elevated troponin     #) Acute pancreatitis: new diagnosis, in the setting of presenting abdominal pain, nausea, elevated lipase, and CT abdomen/pelvis which demonstrating evidence of acute pancreatitis, in the absence of any associated evidence of necrosis, compressing mass, abscess, or pseudocyst. No evidence of associated infection, and patient appears hemodynamically stable.  Appears less likely.  Alcohol use given the patient's reported no alcohol consumption over the last several months.  There is concern for potential gallbladder pancreatitis given development of acute transaminitis and potential cholestatic pattern along with radiographic evidence to suggest mild to moderate dilation of the common bile duct 10-11, with cholelithiasis but no evidence of radiopaque choledocholithiasis.  Additionally, in the setting of right upper quadrant pain, initial fever at [facility] ED, and leukocytosis, there was some concern for early acute cholangitis, prompting initiation of IV antibiotics prior to transfer. Case discussed by [facility] ED physician with on-call gastroenterologist at [facility] (Dr. Allen Norris), who recommended transfer to [facility] for further eval of potential choledocholithiasis, including consideration for ERCP. Upon arriving at [facility], patient appears hemodynamically stable, and is afebrile.  Phos and T bili remain elevated, but trending down relative to yesterday. Consult with placed with [facility] (Dr. Henrene Pastor). Will IV antibiotics, as further described below, and continue conservative measures for management of underlying acute pancreatitis, including symptomatic management, as further detailed below.    Plan: NPO.  Given the patient's age, will be more conservative with IV fluids, prompting initiation of LR @ 61. prn IV fentanyl. prn IV Zofran. Recheck CMP and serum magnesium levels tomorrow morning, with electrolyte  supplementation as needed. Check ionized calcium and triglyceride level.  Gastroenterology consultation, as above. Repeat CBC in the morning. Work-up and management potential early cholangitis, as further described below.       #) Severe sepsis: Upon arrival at [facility] ED on 4 / 26/22, criteria met for sepsis in the setting of presenting objective fever, tachycardia, leukocytosis, with concern for potential early acute cholangitis given concomitant right upper quadrant abdominal pain, fever, and hyperbilirubinemia.  Blood cultures x2 were collected prior to initiation of IV vancomycin, cefepime, Flagyl.  Criteria for patient sepsis were considered severe nature was made on the basis of elevated initial lactic acid 2.4, which is subsequently trended down to 1.1 following interval IV fluids.  No evidence of associated hypotension. No indication for 30 mL/kg IVF bolus.  No other overt source of underlying infection, including negative COVID 19 PCR, chest x-ray/CT chest showing no evidence of pneumonia, and urinalysis, which was inconsistent for UTI.  We will continue further evaluation management of potential acute cholangitis, including IV antibiotics, as further detail below. Discussed patient's case with the antibiotic stewardship pharmacist, with ensuing recommendations for Rocephin plus Flagyl.   Plan: Lactated Ringer's at 75 cc/h.  Rocephin and Flagyl, as further detailed above.  Monitor for results blood cultures x2 collected yesterday. repeat CBC with differential in the morning. GI consulted for potential choledocholithiasis, including consideration for ERCP, as above.  NPO.        #) Elevated troponin: mildly elevated troponin with upward trend; troponin at [facility] Ed around 2100 on 02/21/21 noted be 32. Repeat troponin upon arrive at [facility] continues to trend up. Suspect that this elevated troponin is on the basis of supply demand mismatch in the setting of presenting severe sepsis and concern for  ascending cholangitis in the context of admission for acute pancreatitis, as opposed to representing a type I process due to acute plaque rupture. Additionally, relative decline in renal clearance of troponin over the last day, while not meeting quantitative criteria for AKI, is likely also contributing to troponin elevation. EKG shows no evidence of acute ischemic changes, including no evidence of STEMI. CXR showed left lower lobe basilar opacity consistent with atelectasis versus Haruko Mersch, with ensuing CTA showed no evidence of acute pulmonary embolism, infiltrate, edema, or pneumothorax. Additionally, presentation is not associated with any CP, and abdominal pain is reproducible with palpation. overall, ACS is felt to be less likely relative to type 2 supply demand mismatch, as above, but will closely monitor on telemetry overnight while continuing to trend troponin and treating suspected underlying severe sepsis, as further described above. If subsequent troponin shows significant interval increase, may consider discussing with cardiology at that time, including consideration for echocardiogram.     Plan: repeat troponin in 4 hours. Monitor on telemetry. Repeat ekg now. PRN EKG for development of chest pain. Add-on serum magnesium level. Repeat mag level and cmp in the morning.  Further evaluation management dose.  Severe sepsis due to potential acute cholangitis, as above.         #) Essential hypertension: Not on any antihypertensive medications at home.  Has been normotensive since arrival at [facility].   Plan: close monitoring of ensuing BP  via routine vital signs.      #) GERD: On Pepcid as an outpatient.  Plan: In the setting of current n.p.o. status, will hold home Pepcid for now.  As needed IV Protonix.     DVT prophylaxis: scd's  Code Status: Full code Disposition Plan: Per Rounding  Team Consults called: consulted Dr. Henrene Pastor of [facility], who plan to see patient in the morning.    Admission status: inpatient; med-tele.      Of note, this patient was added by me to the following Admit List/Treatment Team: mcadmits.      PLEASE NOTE THAT DRAGON DICTATION SOFTWARE WAS USED IN THE CONSTRUCTION OF THIS NOTE.   [facility] Pager [phone] From 12PM - 8PM  Otherwise, please contact night-coverage  [email or web address] Password [facility]   02/22/2021, 12:30 PM

## 2021-02-22 NOTE — ED Notes (Signed)
Tele-translator used to inform pt and family that we are just waiting on transport for pt transfer at this time.   MD Larinda Buttery made aware of pt having visible chills at this time and that pt is extremely cold. This RN noted pt to be appropriate temperature to the touch. Pt temperature WNL at this time.

## 2021-02-22 NOTE — ED Notes (Signed)
Attempted to call report to [facility] at this time, RN unavailable at this time. Ascom number given for call back at this time.

## 2021-02-22 NOTE — Progress Notes (Signed)
Notified by lab at 1604 of critical troponin of 174. Howerter, MD paged via amion. Critical result documented in flowsheets. Vitals checked and documented. Awaiting provider response.

## 2021-02-22 NOTE — ED Notes (Signed)
[facility] called with bed assignment 6N13, report [phone]  waiting for transport which may be awhile

## 2021-02-22 NOTE — ED Notes (Signed)
Pharmacy messaged for next dose of tylenol

## 2021-02-22 NOTE — Progress Notes (Signed)
HOSPITAL MEDICINE NOTE    Patient name: Travis Gallagher DOB April 17, 1931 Sending Facility: [facility]  ED Sending Provider: Dr. Fuller Plan with Emergency Medicine  87 year old male without significant past medical history who presented to [facility] emergency department on 4/25 with abdominal pain.  At that time, both LFTs and CT imaging of the abdomen pelvis were not suggestive of an underlying etiology of patient's abdominal pain.  Patient was eventually discharged home.  Due to progressively worsening symptoms patient presented to [facility] emergency department again on 4/26 with worsening symptoms.    On this presentation, patient was found to be febrile with fever of 101.5 F.  Exam revealed significant epigastric tenderness.    Work-up included a leukocytosis of 14.7 with multiple derangements of patient's hepatic function panel including AST of 210, ALT 185 and bilirubin of 6.3.  Patient was found to have a markedly elevated lipase of 2952.  Repeat CT imaging of the abdomen and pelvis revealed peripancreatic stranding consistent with acute pancreatitis and persisting dilation of the common bile duct measuring 10 mm.  This prompted a right upper quadrant ultrasound that revealed cholelithiasis without evidence of acute cholecystitis as well as moderate extrahepatic bile duct dilatation.  Urinalysis not suggestive of urinary tract infection.  Chest imaging revealing no evidence of pneumonia.  Clinically, there was concern that the patient had choledocholithiasis despite there not being a definitive stone of the CBD on imaging.  Due to ongoing fever leukocytosis and markedly elevated bilirubin of 6.3 there was additional concern for the possibility of cholangitis and therefore the patient was placed on intravenous antibiotics.  Case was discussed with Dr. Servando Snare with gastroenterology at [facility] who agreed that choledocholithiasis with cholangitis must be seriously considered and recommended transfer to [facility] for GI consultation and consideration for ERCP.  Patient has been accepted to a medical telemetry bed at [facility]  Marinda Elk  MD [facility]

## 2021-02-22 NOTE — Consult Note (Addendum)
[facility] Consult: 4:30 PM 02/22/2021  LOS: 0 days    Referring Provider: Dr Velia Meyer  Primary Care Physician: PCP is in [place]. Primary Gastroenterologist: None.  unassigned    Reason for Consultation: ? Choledocholithiasis.   HPI: Travis Gallagher is a 87 y.o. male.  He is visiting with family for the past 45 days but his residence is in [place].  Does not speak [place], Spanish only.  PMH scrotal/inguinal hernia repair.   Went to the [facility] ED on 4/25 with progressive abdominal pain in the upper abdomen.   CTAP w contrast: Edema in the proximal stomach submucosa, suggesting gastritis.  Multiple benign-appearing liver cysts.  CBD 9 mm, favor benign senescent dilation. Lipase 80.  AST at 46.  Felt better after GI cocktail.  Discharged with dx suspected gastritis.  Rx Carafate, Famotidine Returned within 24 hours with ongoing pain, chills. Lipase 2952.  T bili 6.3 >> 4.7.  Alk phos 209 >> 178.  AST/ALT 210/185 >> 102/125. Troponins 29 >> 32 >> 174. Lactic acid 2.2 >> 1.1. WBCs 14.7 >> 9.4.  Platelets 95K.  Hb 12.7. Blood culture PCR detected Enterobacterales.   Abdominal ultrasound: Cholelithiasis, no cholecystitis.  Polycystic liver disease.  11 mm CBD, no CBD stones.  PV Dopplers normal  Repeat CTAP with chest revealed 3 cm infrarenal AAA.  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  Peripancreatic stranding consistent with acute interstitial pancreatitis.  No walled off pancreatic fluid collections or necrosis.  The ED doctor today discussed the case with Dr. Allen Norris who  recommended transfer to [facility] for possible ERCP.  MRCP: Acute pancreatitis without necrosis or pseudocyst.  Mild GB wall thickening, no gallstones.  Fusiform dilation of CBD up to 1.4 cm.  No  choledocholithiasis.  Splenic and hepatic cysts.  Small pleural effusions and atelectasis bilaterally.  3.1 sonometer infrarenal AAA. Lipase today 382.  T bili 2.6.  Alk phos 169.  AST/ALT 66/95.  Normal WBCs.  Platelets 89K.  Hgb 12.7.  Blood culture growing E. coli. Vital signs are stable. Meds include IV Protonix, Rocephin, Flagyl. Has only required Tylenol, no narcotics since transfer.,  However epigastric pain radiating to other parts of the abdomen persists.  Has not had any vomiting.  No documented fevers or chills since transfer.  Complains currently of a headache.  Does not drink alcohol.  Never had history of heavy use.     Past Medical History:  Diagnosis Date  . GERD (gastroesophageal reflux disease)   . Hypertension     History reviewed. No pertinent surgical history.  Prior to Admission medications   Medication Sig Start Date End Date Taking? Authorizing Provider  famotidine (PEPCID) 20 MG tablet Take 1 tablet (20 mg total) by mouth daily. 02/20/21 02/20/22 Yes Nance Pear, MD  sucralfate (CARAFATE) 1 g tablet Take 1 tablet (1 g total) by mouth 4 (four) times daily. 02/20/21  Yes Nance Pear, MD    Scheduled Meds: . pantoprazole (PROTONIX) IV  40 mg Intravenous Q24H   Infusions: . cefTRIAXone (ROCEPHIN)  IV    . lactated ringers 950 mL (02/22/21 1412)  . metronidazole 500 mg (02/22/21 1546)   PRN Meds: acetaminophen **OR** acetaminophen, fentaNYL (SUBLIMAZE) injection, ondansetron (ZOFRAN) IV   Allergies as of 02/21/2021  . (No Known Allergies)    History reviewed. No pertinent family history.  Social History   Socioeconomic History  . Marital status: Married    Spouse name: Not on file  . Number of children: Not on file  . Years of education: Not on file  . Highest education level: Not on file  Occupational History  . Not on file  Tobacco Use  . Smoking status: Never Smoker  . Smokeless tobacco: Never Used  Substance and Sexual Activity   . Alcohol use: Not Currently  . Drug use: Not Currently  . Sexual activity: Yes  Other Topics Concern  . Not on file  Social History Narrative  . Not on file   Social Determinants of Health   Financial Resource Strain: Not on file  Food Insecurity: Not on file  Transportation Needs: Not on file  Physical Activity: Not on file  Stress: Not on file  Social Connections: Not on file  Intimate Partner Violence: Not on file    REVIEW OF SYSTEMS: Constitutional: No weakness or fatigue. ENT:  No nose bleeds Pulm: Cough or shortness of breath. CV:  No palpitations, no LE edema.  No angina GU:  No hematuria, no frequency GI: See HPI Heme: Denies unusual or excessive bleeding or bruising. Transfusions: None Neuro:  No peripheral tingling or numbness.  Blind in his left eye.  Complaining of headache today. Derm:  No itching, no rash or sores.  Endocrine:  No sweats or chills.  No polyuria or dysuria Immunization: Not queried. Travel: Traveled from [place] to [place] about 6 weeks ago.   PHYSICAL EXAM: Vital signs in last 24 hours: Vitals:   02/22/21 1613  BP: (!) 108/50  Pulse: 74  Resp: 16  Temp: 98.6 F (37 C)  SpO2: 99%   Wt Readings from Last 3 Encounters:  02/21/21 63.5 kg  02/20/21 63.5 kg    General: Elderly, somewhat frail but alert and comfortable, nontoxic-appearing.  Appears stated age. Head: No facial asymmetry or swelling.  No signs of head trauma. Eyes: No scleral icterus.  No conjunctival pallor.  EOMI Ears: Not hard of hearing Nose: No congestion or discharge Mouth: Oropharynx moist, pink, clear.  Dentures in place were not removed for exam Neck: No JVD, masses, thyromegaly Lungs: Clear bilaterally.  No labored breathing or cough Heart: RRR.  No MRG.  S1, S2 present. Abdomen: Soft.  Diffuse tenderness but more prominent in the epigastric region.  No guarding or rebound.  Active bowel sounds.  No distention.  No surgical scars.  No hernias,  bruits, organomegaly, masses..   Rectal: Deferred Musc/Skeltl: Joint redness, swelling or gross deformity Extremities: No CCE. Neurologic: Alert.  Oriented x3.  Moves all 4 limbs without tremor, strength not tested.  No gross neurologic deficits Skin: Tanned.  No obvious jaundice.  No telangiectasia.  No significant bruising Nodes: No cervical adenopathy Psych: Operative, calm, pleasant, fluid speech.  Intake/Output from previous day: No intake/output data recorded. Intake/Output this shift: No intake/output data recorded.  LAB RESULTS: Recent Labs    02/20/21 1803 02/21/21 2021 02/22/21 1245  WBC 6.4 14.7* 9.4  HGB 13.2 14.4 12.7*  HCT 37.9* 41.4 38.0*  PLT 144* 136* 95*   BMET Lab Results  Component Value Date  NA 138 02/22/2021   NA 137 02/21/2021   NA 138 02/20/2021   K 3.9 02/22/2021   K 3.7 02/21/2021   K 3.6 02/20/2021   CL 107 02/22/2021   CL 105 02/21/2021   CL 103 02/20/2021   CO2 24 02/22/2021   CO2 19 (L) 02/21/2021   CO2 24 02/20/2021   GLUCOSE 90 02/22/2021   GLUCOSE 136 (H) 02/21/2021   GLUCOSE 119 (H) 02/20/2021   BUN 18 02/22/2021   BUN 21 02/21/2021   BUN 21 02/20/2021   CREATININE 1.25 (H) 02/22/2021   CREATININE 1.05 02/21/2021   CREATININE 1.17 02/20/2021   CALCIUM 8.0 (L) 02/22/2021   CALCIUM 8.6 (L) 02/21/2021   CALCIUM 8.6 (L) 02/20/2021   LFT Recent Labs    02/20/21 1803 02/21/21 2021 02/22/21 1245  PROT 7.3 7.7 6.1*  ALBUMIN 3.5 3.5 2.8*  AST 46* 210* 102*  ALT 23 185* 125*  ALKPHOS 106 209* 178*  BILITOT 0.9 6.3* 4.7*  BILIDIR  --   --  2.8*   PT/INR No results found for: INR, PROTIME Hepatitis Panel No results for input(s): HEPBSAG, HCVAB, HEPAIGM, HEPBIGM in the last 72 hours. C-Diff No components found for: CDIFF Lipase     Component Value Date/Time   LIPASE 2,952 (H) 02/21/2021 2021    Drugs of Abuse  No results found for: LABOPIA, COCAINSCRNUR, LABBENZ, AMPHETMU, THCU, LABBARB   RADIOLOGY STUDIES: DG  Chest 2 View  Result Date: 02/21/2021 CLINICAL DATA:  Worsening abdominal pain radiating into the chest. EXAM: CHEST - 2 VIEW COMPARISON:  CT abdomen pelvis February 20, 2021 FINDINGS: The heart size and mediastinal contours are within normal limits. Low lung volumes. Streaky left basilar opacities. No visible pleural effusion or pneumothorax. Thoracic spondylosis. Degenerative changes bilateral shoulders. IMPRESSION: Low lung volumes with streaky left basilar opacities, which may represent atelectasis or infiltrate. Electronically Signed   By: Dahlia Bailiff MD   On: 02/21/2021 20:44   CT Angio Chest PE W and/or Wo Contrast  Result Date: 02/21/2021 CLINICAL DATA:  Worsening abdominal pain radiating to the chest. EXAM: CT ANGIOGRAPHY CHEST WITH CONTRAST TECHNIQUE: Multidetector CT imaging of the chest was performed using the standard protocol during bolus administration of intravenous contrast. Multiplanar CT image reconstructions and MIPs were obtained to evaluate the vascular anatomy. CONTRAST:  30m OMNIPAQUE IOHEXOL 350 MG/ML SOLN COMPARISON:  CT abdomen pelvis February 20, 2021. FINDINGS: Cardiovascular: Satisfactory opacification of the pulmonary arteries to the segmental level. No evidence of pulmonary embolism. Aortic atherosclerosis. The ascending aorta measures 3.4 cm in maximal diameter. There are few areas of focal enlargement involving the thoracic aorta 1 in the aortic arch measures 3.6 cm in maximal diameter and another in the descending aorta measures 3.6 cm in maximal diameter. The heart size. No pericardial effusion. Mediastinum/Nodes: No enlarged mediastinal, hilar, or axillary lymph nodes. Thyroid gland, trachea, and esophagus demonstrate no significant findings. Lungs/Pleura: Bibasilar atelectasis. Left upper lobe calcified granuloma. Mild emphysematous change. No pleural effusion. No pneumothorax. Upper Abdomen: Polycystic liver disease. Distended gallbladder without signs gallbladder  inflammation. 2.9 cm cyst in the spleen. Musculoskeletal: Multilevel degenerative changes spine. No acute osseous abnormality. Review of the MIP images confirms the above findings. IMPRESSION: 1. No evidence of pulmonary embolism. 2. Focal areas of aortic ectasia involving the thoracic aorta, involving the aortic arch and descending aorta both of which measure 3.6 cm in maximal diameter. Recommend annual imaging followup by CTA or MRA. This recommendation follows 2010 ACCF/AHA/AATS/ACR/ASA/SCA/SCAI/SIR/STS/SVM Guidelines for the  Diagnosis and Management of Patients with Thoracic Aortic Disease. Circulation.2010; 121: D974-B638. Aortic aneurysm NOS (ICD10-I71.9) 3. Polycystic liver disease. 4. Distended gallbladder without signs gallbladder inflammation. 5. Emphysema and aortic atherosclerosis. Aortic Atherosclerosis (ICD10-I70.0) and Emphysema (ICD10-J43.9). Electronically Signed   By: Dahlia Bailiff MD   On: 02/21/2021 23:43   CT ABDOMEN PELVIS W CONTRAST  Result Date: 02/21/2021 CLINICAL DATA:  Persistent abdominal pain EXAM: CT ABDOMEN AND PELVIS WITH CONTRAST TECHNIQUE: Multidetector CT imaging of the abdomen and pelvis was performed using the standard protocol following bolus administration of intravenous contrast. CONTRAST:  82m OMNIPAQUE IOHEXOL 350 MG/ML SOLN COMPARISON:  February 20, 2021 and same-day ultrasound FINDINGS: Lower chest: Bibasilar atelectasis. Hepatobiliary: Numerous hepatic cysts consistent with polycystic liver disease. Gallbladder is distended without findings of acute inflammation. Similar dilation of the common duct measuring up to 10 mm Pancreas: Peripancreatic stranding. No pancreatic ductal dilation. No walled off collection. Spleen: 2. 9 cm cyst in the spleen Adrenals/Urinary Tract: Adrenal glands are unremarkable. Kidneys are normal, without renal calculi, focal lesion, or hydronephrosis. Bladder is unremarkable. Stomach/Bowel: Similar sub mucosal edema in the proximal stomach. No  pathologically dilated loops of small bowel. Normal appendix. No suspicious colonic wall thickening or mass like lesions. Vascular/Lymphatic: The portal vein, splenic vein and SMV are patent. Aortic atherosclerosis. Infrarenal abdominal aortic aneurysm measuring 3 cm on image 40/2. 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 No pathologically enlarged abdominal or pelvic lymph nodes. Reproductive: Mild prostatic enlargement. Other: Peripancreatic fluid. No abdominal ascites or walled off fluid collections. No pneumoperitoneum. Musculoskeletal: Multilevel degenerative changes spine. No acute osseous abnormality. IMPRESSION: 1. Peripancreatic stranding most consistent with acute interstitial pancreatitis, recommend correlation with serum lipase. No walled off fluid collections or evidence of pancreatic necrosis. 2. Similar submucosal edema in the proximal stomach suggestive of gastritis. 3. Stable dilation of the common duct measuring up to 10 mm, favored to represent benign senescent dilation. 4. 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 5. Infrarenal abdominal aortic aneurysm measuring 3 cm. Recommend follow-up ultrasound every 3 years. This recommendation follows ACR consensus guidelines: White Paper of the ACR Incidental Findings Committee II on Vascular Findings. J Am Coll Radiol 2013; 10:789-794. 6. Aortic atherosclerosis.  Aortic Atherosclerosis (ICD10-I70.0). Electronically Signed   By: JDahlia BailiffMD   On: 02/21/2021 23:53   CT ABDOMEN PELVIS W CONTRAST  Result Date: 02/20/2021 CLINICAL DATA:  RIGHT-sided abdominal pain EXAM: CT ABDOMEN AND PELVIS WITH CONTRAST TECHNIQUE: Multidetector CT imaging of the abdomen and pelvis was performed using the standard protocol following bolus administration of intravenous contrast. CONTRAST:  1077mOMNIPAQUE IOHEXOL 300 MG/ML  SOLN COMPARISON:  None. FINDINGS: Lower chest: Lung bases are clear. Hepatobiliary:  Multiple round simple fluid cysts of varying size throughout the liver parenchyma. No biliary duct dilatation. Common bile duct is mildly dilated 9 mm. No obstructing lesion. Pancreas: Pancreas is normal. No ductal dilatation. No pancreatic inflammation. Spleen: Cyst within the medial aspect of the spleen measures 2.7 cm. Adrenals/urinary tract: Adrenal glands and kidneys are normal. The ureters and bladder normal. Stomach/Bowel: Mild submucosal thickening in the gastric fundus and cardia (image 27/series 2 and image 74/series 6). The gastric body and antrum appear normal. Small bowel is normal. No evidence of bowel obstruction or inflammation. Appendix normal. The colon and rectosigmoid colon are normal. Vascular/Lymphatic: Calcification abdominal aorta. 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 No lymphadenopathy Reproductive: . prostate unremarkable. Other: No free fluid.  Musculoskeletal: No aggressive osseous lesion. IMPRESSION: 1. Submucosal edema in the proximal stomach. Findings suggest gastritis. 2. Multiple benign cysts within liver. 3. Mild dilatation of common bile duct is favored benign senescent dilatation. Electronically Signed   By: Suzy Bouchard M.D.   On: 02/20/2021 19:57   US ABDOMEN LIMITED RUQ (LIVER/GB)  Result Date: 02/21/2021 CLINICAL DATA:  Epigastric pain since yesterday. EXAM: ULTRASOUND ABDOMEN LIMITED RIGHT UPPER QUADRANT COMPARISON:  CT abdomen and pelvis 02/20/2021 FINDINGS: Gallbladder: Cholelithiasis with several small stones demonstrated in the gallbladder. No gallbladder wall thickening or edema. Murphy's sign is normal. Common bile duct: Diameter: 11 mm, dilated.  Similar to prior CT. Liver: Numerous cysts throughout the liver consistent with polycystic liver disease. No solid masses identified. Portal vein is patent on color Doppler imaging with normal direction of blood flow towards the liver. Other: None. IMPRESSION: Cholelithiasis without evidence of  acute cholecystitis. Polycystic liver disease. Moderate extrahepatic bile duct dilatation, cause not determined. Electronically Signed   By: Lucienne Capers M.D.   On: 02/21/2021 23:07     IMPRESSION:   *   Pancreatitis.  Presumed biliary in the setting of cholelithiasis, dilated CBD.  None of the recent imaging confirms actual choledocholithiasis so may have passed a stone. ?  Cholangitis.  Metronidazole, Rocephin in place.  *   E. coli bacteremia.  Rocephin, Flagyl in place.  *   Polycystic liver disease.    PLAN:     *   Allow clear liquids.  Not at all clear ERCP is indicated but will discuss with Dr. Henrene Pastor.  *   Would seek surgical opinion regarding cholecystectomy.   Azucena Freed  02/22/2021, 4:30 PM Phone [phone]  GI ATTENDING  History, laboratories, x-rays reviewed.  Patient seen and examined.  Agree with comprehensive consultation note as outlined above.  This patient presents with acute biliary pancreatitis.  Clinically stable.  Liver test improving.  Fortunately, MRCP does not demonstrate choledocholithiasis.  Thus, no indication for ERCP.  Ultrasound did show cholelithiasis.  I recommend management of pancreatitis in the usual manner with supportive measures and hydration.  This patient needs general surgical consultation and would benefit from laparoscopic cholecystectomy with IOC prior to discharge.  We are available if needed for questions.  Thanks.  Docia Chuck. Geri Seminole., M.D. [facility] Division of Gastroenterology

## 2021-02-22 NOTE — ED Notes (Signed)
Per ED secretary, waiting to hear from [facility] regarding an available bed.

## 2021-02-22 NOTE — ED Notes (Signed)
Interpreter Marylu Lund [phone] used to discuss transfer.

## 2021-02-22 NOTE — Progress Notes (Signed)
PHARMACY - PHYSICIAN COMMUNICATION CRITICAL VALUE ALERT - BLOOD CULTURE IDENTIFICATION (BCID)  Travis Gallagher is an 87 y.o. male who presented to [facility] on 02/21/2021 with a chief complaint of abdominal pain  Assessment:  4/26 blood cultures with 4/4 GNR, BCID = E. Coli (no resistance detected)  Name of physician (or Provider) Contacted: Dr Larinda Buttery  Current antibiotics: cefepime x 1 and metronidazole x 1  Changes to prescribed antibiotics recommended:  Recommendations declined by provider due to patient transferrring to [facility].  Defer further antibiotics to hospitalist there  Results for orders placed or performed during the hospital encounter of 02/21/21  Blood Culture ID Panel (Reflexed) (Collected: 02/21/2021  8:21 PM)  Result Value Ref Range   Enterococcus faecalis NOT DETECTED NOT DETECTED   Enterococcus Faecium NOT DETECTED NOT DETECTED   Listeria monocytogenes NOT DETECTED NOT DETECTED   Staphylococcus species NOT DETECTED NOT DETECTED   Staphylococcus aureus (BCID) NOT DETECTED NOT DETECTED   Staphylococcus epidermidis NOT DETECTED NOT DETECTED   Staphylococcus lugdunensis NOT DETECTED NOT DETECTED   Streptococcus species NOT DETECTED NOT DETECTED   Streptococcus agalactiae NOT DETECTED NOT DETECTED   Streptococcus pneumoniae NOT DETECTED NOT DETECTED   Streptococcus pyogenes NOT DETECTED NOT DETECTED   A.calcoaceticus-baumannii NOT DETECTED NOT DETECTED   Bacteroides fragilis NOT DETECTED NOT DETECTED   Enterobacterales DETECTED (A) NOT DETECTED   Enterobacter cloacae complex NOT DETECTED NOT DETECTED   Escherichia coli DETECTED (A) NOT DETECTED   Klebsiella aerogenes NOT DETECTED NOT DETECTED   Klebsiella oxytoca NOT DETECTED NOT DETECTED   Klebsiella pneumoniae NOT DETECTED NOT DETECTED   Proteus species NOT DETECTED NOT DETECTED   Salmonella species NOT DETECTED NOT DETECTED   Serratia marcescens NOT DETECTED NOT DETECTED   Haemophilus influenzae  NOT DETECTED NOT DETECTED   Neisseria meningitidis NOT DETECTED NOT DETECTED   Pseudomonas aeruginosa NOT DETECTED NOT DETECTED   Stenotrophomonas maltophilia NOT DETECTED NOT DETECTED   Candida albicans NOT DETECTED NOT DETECTED   Candida auris NOT DETECTED NOT DETECTED   Candida glabrata NOT DETECTED NOT DETECTED   Candida krusei NOT DETECTED NOT DETECTED   Candida parapsilosis NOT DETECTED NOT DETECTED   Candida tropicalis NOT DETECTED NOT DETECTED   Cryptococcus neoformans/gattii NOT DETECTED NOT DETECTED   CTX-M ESBL NOT DETECTED NOT DETECTED   Carbapenem resistance IMP NOT DETECTED NOT DETECTED   Carbapenem resistance KPC NOT DETECTED NOT DETECTED   Carbapenem resistance NDM NOT DETECTED NOT DETECTED   Carbapenem resist OXA 48 LIKE NOT DETECTED NOT DETECTED   Carbapenem resistance VIM NOT DETECTED NOT DETECTED    Juliette Alcide, PharmD, BCPS.   Work Cell: [phone] 02/22/2021 9:08 AM

## 2021-02-22 NOTE — ED Notes (Signed)
Consent for transfer completed at this time

## 2021-02-23 LAB — CBC WITH DIFFERENTIAL/PLATELET
Abs Immature Granulocytes: 0.05 10*3/uL (ref 0.00–0.07)
Basophils Absolute: 0 10*3/uL (ref 0.0–0.1)
Basophils Relative: 0 %
Eosinophils Absolute: 0 10*3/uL (ref 0.0–0.5)
Eosinophils Relative: 0 %
HCT: 38.1 % — ABNORMAL LOW (ref 39.0–52.0)
Hemoglobin: 12.7 g/dL — ABNORMAL LOW (ref 13.0–17.0)
Immature Granulocytes: 1 %
Lymphocytes Relative: 7 %
Lymphs Abs: 0.6 10*3/uL — ABNORMAL LOW (ref 0.7–4.0)
MCH: 29.7 pg (ref 26.0–34.0)
MCHC: 33.3 g/dL (ref 30.0–36.0)
MCV: 89 fL (ref 80.0–100.0)
Monocytes Absolute: 0.3 10*3/uL (ref 0.1–1.0)
Monocytes Relative: 4 %
Neutro Abs: 6.6 10*3/uL (ref 1.7–7.7)
Neutrophils Relative %: 88 %
Platelets: 89 10*3/uL — ABNORMAL LOW (ref 150–400)
RBC: 4.28 MIL/uL (ref 4.22–5.81)
RDW: 13 % (ref 11.5–15.5)
WBC: 7.6 10*3/uL (ref 4.0–10.5)
nRBC: 0 % (ref 0.0–0.2)

## 2021-02-23 LAB — COMPREHENSIVE METABOLIC PANEL
ALT: 95 U/L — ABNORMAL HIGH (ref 0–44)
AST: 66 U/L — ABNORMAL HIGH (ref 15–41)
Albumin: 2.6 g/dL — ABNORMAL LOW (ref 3.5–5.0)
Alkaline Phosphatase: 169 U/L — ABNORMAL HIGH (ref 38–126)
Anion gap: 12 (ref 5–15)
BUN: 23 mg/dL (ref 8–23)
CO2: 20 mmol/L — ABNORMAL LOW (ref 22–32)
Calcium: 8.2 mg/dL — ABNORMAL LOW (ref 8.9–10.3)
Chloride: 106 mmol/L (ref 98–111)
Creatinine, Ser: 1.27 mg/dL — ABNORMAL HIGH (ref 0.61–1.24)
GFR, Estimated: 55 mL/min — ABNORMAL LOW (ref >60)
Glucose, Bld: 75 mg/dL (ref 70–99)
Potassium: 3.6 mmol/L (ref 3.5–5.1)
Sodium: 138 mmol/L (ref 135–145)
Total Bilirubin: 2.6 mg/dL — ABNORMAL HIGH (ref 0.3–1.2)
Total Protein: 6.2 g/dL — ABNORMAL LOW (ref 6.5–8.1)

## 2021-02-23 LAB — MAGNESIUM: Magnesium: 2 mg/dL (ref 1.7–2.4)

## 2021-02-23 LAB — BILIRUBIN, DIRECT: Bilirubin, Direct: 1.5 mg/dL — ABNORMAL HIGH (ref 0.0–0.2)

## 2021-02-23 LAB — LIPASE, BLOOD: Lipase: 382 U/L — ABNORMAL HIGH (ref 11–51)

## 2021-02-23 LAB — SURGICAL PCR SCREEN
MRSA, PCR: NEGATIVE
Staphylococcus aureus: POSITIVE — AB

## 2021-02-23 LAB — CALCIUM, IONIZED: Calcium, Ionized, Serum: 4.7 mg/dL (ref 4.5–5.6)

## 2021-02-23 LAB — URINE CULTURE: Culture: <10000 — AB

## 2021-02-23 NOTE — Consult Note (Addendum)
[facility] Consult Note  Travis Gallagher 23-Jan-1931  [id number].    Requesting MD: Marylu Lund, MD  Chief Complaint/Reason for Consult: biliary pancreatitis, laparoscopic cholecystectomy   HPI:  History obtained using a spanish video interpreter, the patients daughter in law is at bedside.   Travis Gallagher in an 87 y/o M from [place], visiting family in [place] until June 2022, who presented with a cc epigastric pain described as sharp, radiating across the upper abdomen, exacerbated by food. associated sxs include fever and constipation. Denies similar pain in the past. He was evaluated in the [place] ED 4/25, diagnosed with gastritis, discharged home. He re-presented to the ED 4/26 with worsening pain, tachycardia, and fever.   ED workup significant for leukocytosis (14.7), lipase 2952, t.bili 6.3, AST 210, ALT 185, AP 209, and troponin elevation to 174. RUQ U/S and CT A/P showed cholelithiasis without evidence of cholecystitis and mild dilation of CBD without visible choledocholithiasis. The patient was admitted for management of gallstone pancreatitis and GI was consulted. MRCP was negative for choledocholithiasis. General surgery has been asked to see the patient for possible laparoscopic cholecystectomy.   Substance use: former smoker who quit >20 years ago, denies regular drug or alcohol use, may drink a glass of alcohol for a special occasion Blood thinners: none Past surgeries: pt reports history of hernia repair for inguinal hernia that involved his scrotum Allergies: NKDA, denies hx complications under general anesthesia.   Denies a history of MI, CVA, asthma, or COPD. At baseline states he mobilizes without an assistive device and can climb a flight of stairs without stopping due to DOE or chest pain.    ROS: Review of Systems  Constitutional: Positive for fever.  HENT: Negative.   Eyes: Negative.   Respiratory: Negative.    Cardiovascular: Negative.   Gastrointestinal: Positive for abdominal pain and constipation. Negative for blood in stool and melena.  Genitourinary: Negative.   Musculoskeletal: Negative.   Skin: Negative.   Neurological: Negative.   Endo/Heme/Allergies: Negative.   Psychiatric/Behavioral: Negative.     History reviewed. No pertinent family history.  Past Medical History:  Diagnosis Date  . GERD (gastroesophageal reflux disease)   . Hypertension     History reviewed. No pertinent surgical history.  Social History:  reports that he has never smoked. He has never used smokeless tobacco. He reports previous alcohol use. He reports previous drug use.  Allergies: No Known Allergies  Medications Prior to Admission  Medication Sig Dispense Refill  . famotidine (PEPCID) 20 MG tablet Take 1 tablet (20 mg total) by mouth daily. 30 tablet 1  . sucralfate (CARAFATE) 1 g tablet Take 1 tablet (1 g total) by mouth 4 (four) times daily. 60 tablet 0    Blood pressure (!) 162/72, pulse 82, temperature 97.7 F (36.5 C), temperature source Oral, resp. rate 18, SpO2 96 %. Physical Exam: Constitutional: elderly male in NAD; conversant; no deformities Eyes: Moist conjunctiva; no lid lag; anicteric; PERRL Neck: Trachea midline; no thyromegaly Lungs: Normal respiratory effort; no tactile fremitus CV: RRR; no palpable thrills; no pitting edema GI: Abd soft, TTP epigastric region and RUQ with voluntary guarding. no palpable hepatosplenomegaly MSK: symmetrical, no clubbing/cyanosis Psychiatric: Appropriate affect; alert and oriented x3 Lymphatic: No palpable cervical or axillary lymphadenopathy  Results for orders placed or performed during the hospital encounter of 02/22/21 (from the past 48 hour(s))  Comprehensive metabolic panel     Status: Abnormal   Collection Time: 02/22/21 12:45 PM  Result Value  Ref Range   Sodium 138 135 - 145 mmol/L   Potassium 3.9 3.5 - 5.1 mmol/L   Chloride 107 98 -  111 mmol/L   CO2 24 22 - 32 mmol/L   Glucose, Bld 90 70 - 99 mg/dL    Comment: Glucose reference range applies only to samples taken after fasting for at least 8 hours.   BUN 18 8 - 23 mg/dL   Creatinine, Ser 1.61 (H) 0.61 - 1.24 mg/dL   Calcium 8.0 (L) 8.9 - 10.3 mg/dL   Total Protein 6.1 (L) 6.5 - 8.1 g/dL   Albumin 2.8 (L) 3.5 - 5.0 g/dL   AST 096 (H) 15 - 41 U/L   ALT 125 (H) 0 - 44 U/L   Alkaline Phosphatase 178 (H) 38 - 126 U/L   Total Bilirubin 4.7 (H) 0.3 - 1.2 mg/dL   GFR, Estimated 56 (L) >60 mL/min    Comment: (NOTE) Calculated using the CKD-EPI Creatinine Equation (2021)    Anion gap 7 5 - 15    Comment: Performed at [facility], [address], [place], [place] [address]  CBC with Differential/Platelet     Status: Abnormal   Collection Time: 02/22/21 12:45 PM  Result Value Ref Range   WBC 9.4 4.0 - 10.5 K/uL   RBC 4.26 4.22 - 5.81 MIL/uL   Hemoglobin 12.7 (L) 13.0 - 17.0 g/dL   HCT 98.1 (L) 19.1 - 47.8 %   MCV 89.2 80.0 - 100.0 fL   MCH 29.8 26.0 - 34.0 pg   MCHC 33.4 30.0 - 36.0 g/dL   RDW 29.5 62.1 - 30.8 %   Platelets 95 (L) 150 - 400 K/uL    Comment: Immature Platelet Fraction may be clinically indicated, consider ordering this additional test [id number] REPEATED TO VERIFY PLATELET COUNT CONFIRMED BY SMEAR    nRBC 0.0 0.0 - 0.2 %   Neutrophils Relative % 91 %   Neutro Abs 8.4 (H) 1.7 - 7.7 K/uL   Lymphocytes Relative 5 %   Lymphs Abs 0.5 (L) 0.7 - 4.0 K/uL   Monocytes Relative 4 %   Monocytes Absolute 0.4 0.1 - 1.0 K/uL   Eosinophils Relative 0 %   Eosinophils Absolute 0.0 0.0 - 0.5 K/uL   Basophils Relative 0 %   Basophils Absolute 0.0 0.0 - 0.1 K/uL   Immature Granulocytes 0 %   Abs Immature Granulocytes 0.04 0.00 - 0.07 K/uL    Comment: Performed at [facility], [address] [place], [place] [address]  Magnesium     Status: None   Collection Time: 02/22/21 12:45 PM  Result Value Ref Range   Magnesium 1.9 1.7 - 2.4 mg/dL     Comment: Performed at [facility], [address], [place], [place] [address]  Gamma GT     Status: Abnormal   Collection Time: 02/22/21 12:45 PM  Result Value Ref Range   GGT 240 (H) 7 - 50 U/L    Comment: Performed at [facility], [address], [place], [place] [address]  Bilirubin, direct     Status: Abnormal   Collection Time: 02/22/21 12:45 PM  Result Value Ref Range   Bilirubin, Direct 2.8 (H) 0.0 - 0.2 mg/dL    Comment: Performed at [facility], [address], [place], [place] [address]  Calcium, ionized     Status: None   Collection Time: 02/22/21  2:49 PM  Result Value Ref Range  Calcium, Ionized, Serum 4.7 4.5 - 5.6 mg/dL    Comment: (NOTE) Performed At: [facility] [address] [place], [place] [id number] Jolene Schimke MD [id number]   Troponin I (High Sensitivity)     Status: Abnormal   Collection Time: 02/22/21  2:49 PM  Result Value Ref Range   Troponin I (High Sensitivity) 174 (HH) <18 ng/L    Comment: CRITICAL RESULT CALLED TO, READ BACK BY AND VERIFIED WITH: A RONELLIN RN [phone] [id number] K FORSYTH (NOTE) Elevated high sensitivity troponin I (hsTnI) values and significant  changes across serial measurements may suggest ACS but many other  chronic and acute conditions are known to elevate hsTnI results.  Refer to the Links section for chest pain algorithms and additional  guidance. Performed at [facility], [address] [place], [place] [address]   Triglycerides     Status: None   Collection Time: 02/22/21  2:49 PM  Result Value Ref Range   Triglycerides 35 <150 mg/dL    Comment: Performed at [facility], [address] [place], [place] [address]  Troponin I (High Sensitivity)     Status: Abnormal   Collection Time: 02/22/21  7:59 PM  Result Value Ref Range   Troponin I (High Sensitivity) 112 (HH) <18 ng/L    Comment: CRITICAL VALUE NOTED.  VALUE IS CONSISTENT WITH PREVIOUSLY REPORTED AND CALLED  VALUE. (NOTE) Elevated high sensitivity troponin I (hsTnI) values and significant  changes across serial measurements may suggest ACS but many other  chronic and acute conditions are known to elevate hsTnI results.  Refer to the Links section for chest pain algorithms and additional  guidance. Performed at [facility], [address] [place], [place] [address]   Comprehensive metabolic panel     Status: Abnormal   Collection Time: 02/23/21  1:26 AM  Result Value Ref Range   Sodium 138 135 - 145 mmol/L   Potassium 3.6 3.5 - 5.1 mmol/L   Chloride 106 98 - 111 mmol/L   CO2 20 (L) 22 - 32 mmol/L   Glucose, Bld 75 70 - 99 mg/dL    Comment: Glucose reference range applies only to samples taken after fasting for at least 8 hours.   BUN 23 8 - 23 mg/dL   Creatinine, Ser 2.33 (H) 0.61 - 1.24 mg/dL   Calcium 8.2 (L) 8.9 - 10.3 mg/dL   Total Protein 6.2 (L) 6.5 - 8.1 g/dL   Albumin 2.6 (L) 3.5 - 5.0 g/dL   AST 66 (H) 15 - 41 U/L   ALT 95 (H) 0 - 44 U/L   Alkaline Phosphatase 169 (H) 38 - 126 U/L   Total Bilirubin 2.6 (H) 0.3 - 1.2 mg/dL   GFR, Estimated 55 (L) >60 mL/min    Comment: (NOTE) Calculated using the CKD-EPI Creatinine Equation (2021)    Anion gap 12 5 - 15    Comment: Performed at [facility], [address] [place], [place] [address]  Magnesium     Status: None   Collection Time: 02/23/21  1:26 AM  Result Value Ref Range   Magnesium 2.0 1.7 - 2.4 mg/dL    Comment: Performed at [facility], [address] [place], [place] [address]  CBC with Differential/Platelet     Status: Abnormal   Collection Time: 02/23/21  1:26 AM  Result Value Ref Range   WBC 7.6 4.0 - 10.5 K/uL   RBC 4.28 4.22 - 5.81 MIL/uL  Hemoglobin 12.7 (L) 13.0 - 17.0 g/dL   HCT 16.138.1 (L) 09.639.0 - 04.552.0 %   MCV 89.0 80.0 - 100.0 fL   MCH 29.7 26.0 - 34.0 pg   MCHC 33.3 30.0 - 36.0 g/dL   RDW 40.913.0 81.111.5 - 91.415.5 %   Platelets 89 (L) 150 - 400 K/uL    Comment: Immature Platelet Fraction  may be clinically indicated, consider ordering this additional test [id number] CONSISTENT WITH PREVIOUS RESULT REPEATED TO VERIFY    nRBC 0.0 0.0 - 0.2 %   Neutrophils Relative % 88 %   Neutro Abs 6.6 1.7 - 7.7 K/uL   Lymphocytes Relative 7 %   Lymphs Abs 0.6 (L) 0.7 - 4.0 K/uL   Monocytes Relative 4 %   Monocytes Absolute 0.3 0.1 - 1.0 K/uL   Eosinophils Relative 0 %   Eosinophils Absolute 0.0 0.0 - 0.5 K/uL   Basophils Relative 0 %   Basophils Absolute 0.0 0.0 - 0.1 K/uL   Immature Granulocytes 1 %   Abs Immature Granulocytes 0.05 0.00 - 0.07 K/uL    Comment: Performed at [facility], [address] [place], [place] [address]  Bilirubin, direct     Status: Abnormal   Collection Time: 02/23/21  1:26 AM  Result Value Ref Range   Bilirubin, Direct 1.5 (H) 0.0 - 0.2 mg/dL    Comment: Performed at [facility], [address] [place], [place] [address]  Lipase, blood     Status: Abnormal   Collection Time: 02/23/21  1:26 AM  Result Value Ref Range   Lipase 382 (H) 11 - 51 U/L    Comment: Performed at [facility], [address] [place], [place] [address]   DG Chest 2 View  Result Date: 02/21/2021 CLINICAL DATA:  Worsening abdominal pain radiating into the chest. EXAM: CHEST - 2 VIEW COMPARISON:  CT abdomen pelvis February 20, 2021 FINDINGS: The heart size and mediastinal contours are within normal limits. Low lung volumes. Streaky left basilar opacities. No visible pleural effusion or pneumothorax. Thoracic spondylosis. Degenerative changes bilateral shoulders. IMPRESSION: Low lung volumes with streaky left basilar opacities, which may represent atelectasis or infiltrate. Electronically Signed   By: Maudry MayhewJeffrey  Waltz MD   On: 02/21/2021 20:44   CT Angio Chest PE W and/or Wo Contrast  Result Date: 02/21/2021 CLINICAL DATA:  Worsening abdominal pain radiating to the chest. EXAM: CT ANGIOGRAPHY CHEST WITH CONTRAST TECHNIQUE: Multidetector CT imaging of the chest was  performed using the standard protocol during bolus administration of intravenous contrast. Multiplanar CT image reconstructions and MIPs were obtained to evaluate the vascular anatomy. CONTRAST:  75mL OMNIPAQUE IOHEXOL 350 MG/ML SOLN COMPARISON:  CT abdomen pelvis February 20, 2021. FINDINGS: Cardiovascular: Satisfactory opacification of the pulmonary arteries to the segmental level. No evidence of pulmonary embolism. Aortic atherosclerosis. The ascending aorta measures 3.4 cm in maximal diameter. There are few areas of focal enlargement involving the thoracic aorta 1 in the aortic arch measures 3.6 cm in maximal diameter and another in the descending aorta measures 3.6 cm in maximal diameter. The heart size. No pericardial effusion. Mediastinum/Nodes: No enlarged mediastinal, hilar, or axillary lymph nodes. Thyroid gland, trachea, and esophagus demonstrate no significant findings. Lungs/Pleura: Bibasilar atelectasis. Left upper lobe calcified granuloma. Mild emphysematous change. No pleural effusion. No pneumothorax. Upper Abdomen: Polycystic liver disease. Distended gallbladder without signs gallbladder inflammation. 2.9 cm cyst in the spleen. Musculoskeletal: Multilevel degenerative changes spine. No acute osseous abnormality. Review of the  MIP images confirms the above findings. IMPRESSION: 1. No evidence of pulmonary embolism. 2. Focal areas of aortic ectasia involving the thoracic aorta, involving the aortic arch and descending aorta both of which measure 3.6 cm in maximal diameter. Recommend annual imaging followup by CTA or MRA. This recommendation follows 2010 ACCF/AHA/AATS/ACR/ASA/SCA/SCAI/SIR/STS/SVM Guidelines for the Diagnosis and Management of Patients with Thoracic Aortic Disease. Circulation.2010; 121: S063-K160. Aortic aneurysm NOS (ICD10-I71.9) 3. Polycystic liver disease. 4. Distended gallbladder without signs gallbladder inflammation. 5. Emphysema and aortic atherosclerosis. Aortic Atherosclerosis  (ICD10-I70.0) and Emphysema (ICD10-J43.9). Electronically Signed   By: Maudry Mayhew MD   On: 02/21/2021 23:43   CT ABDOMEN PELVIS W CONTRAST  Result Date: 02/21/2021 CLINICAL DATA:  Persistent abdominal pain EXAM: CT ABDOMEN AND PELVIS WITH CONTRAST TECHNIQUE: Multidetector CT imaging of the abdomen and pelvis was performed using the standard protocol following bolus administration of intravenous contrast. CONTRAST:  78mL OMNIPAQUE IOHEXOL 350 MG/ML SOLN COMPARISON:  February 20, 2021 and same-day ultrasound FINDINGS: Lower chest: Bibasilar atelectasis. Hepatobiliary: Numerous hepatic cysts consistent with polycystic liver disease. Gallbladder is distended without findings of acute inflammation. Similar dilation of the common duct measuring up to 10 mm Pancreas: Peripancreatic stranding. No pancreatic ductal dilation. No walled off collection. Spleen: 2. 9 cm cyst in the spleen Adrenals/Urinary Tract: Adrenal glands are unremarkable. Kidneys are normal, without renal calculi, focal lesion, or hydronephrosis. Bladder is unremarkable. Stomach/Bowel: Similar sub mucosal edema in the proximal stomach. No pathologically dilated loops of small bowel. Normal appendix. No suspicious colonic wall thickening or mass like lesions. Vascular/Lymphatic: The portal vein, splenic vein and SMV are patent. Aortic atherosclerosis. Infrarenal abdominal aortic aneurysm measuring 3 cm on image 40/2. 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 No pathologically enlarged abdominal or pelvic lymph nodes. Reproductive: Mild prostatic enlargement. Other: Peripancreatic fluid. No abdominal ascites or walled off fluid collections. No pneumoperitoneum. Musculoskeletal: Multilevel degenerative changes spine. No acute osseous abnormality. IMPRESSION: 1. Peripancreatic stranding most consistent with acute interstitial pancreatitis, recommend correlation with serum lipase. No walled off fluid collections or evidence of  pancreatic necrosis. 2. Similar submucosal edema in the proximal stomach suggestive of gastritis. 3. Stable dilation of the common duct measuring up to 10 mm, favored to represent benign senescent dilation. 4. 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 5. Infrarenal abdominal aortic aneurysm measuring 3 cm. Recommend follow-up ultrasound every 3 years. This recommendation follows ACR consensus guidelines: White Paper of the ACR Incidental Findings Committee II on Vascular Findings. J Am Coll Radiol 2013; 10:789-794. 6. Aortic atherosclerosis.  Aortic Atherosclerosis (ICD10-I70.0). Electronically Signed   By: Maudry Mayhew MD   On: 02/21/2021 23:53   MR ABDOMEN MRCP W WO CONTAST  Result Date: 02/23/2021 CLINICAL DATA:  Evaluate for choledocholithiasis. EXAM: MRI ABDOMEN WITHOUT AND WITH CONTRAST (INCLUDING MRCP) TECHNIQUE: Multiplanar multisequence MR imaging of the abdomen was performed both before and after the administration of intravenous contrast. Heavily T2-weighted images of the biliary and pancreatic ducts were obtained, and three-dimensional MRCP images were rendered by post processing. CONTRAST:  6.75mL GADAVIST GADOBUTROL 1 MMOL/ML IV SOLN COMPARISON:  02/21/2021 FINDINGS: Exam detail is diminished due to motion artifact. Lower chest: Small bilateral pleural effusions with overlying atelectasis. Hepatobiliary: Innumerable cysts are identified throughout both lobes of liver compatible with polycystic liver disease. Mild gallbladder wall thickening measures up to 4.6 mm. No gallstones identified. Fusiform dilatation of the common bile duct measures up to 1.4 cm. No signs of choledocholithiasis at  this time. Pancreas: Interstitial edema and peripancreatic fat stranding identified. No main duct dilatation. No signs of pancreatic necrosis or pseudocyst formation. No pancreatic mass identified at this time. Spleen: 3 cm cyst identified. Spleen is otherwise normal in appearance and  size. Adrenals/Urinary Tract: Normal appearance of the adrenal glands. No hydronephrosis identified bilaterally. Stomach/Bowel: Visualized portions within the abdomen are unremarkable. Vascular/Lymphatic: Extensive aortic atherosclerosis. Infrarenal abdominal aortic aneurysm measures 3.1 cm. No abdominal adenopathy. Other: Soft tissue edema extends from the pancreas into the retroperitoneal fat bilaterally. Mild perisplenic and perihepatic ascites. Musculoskeletal: No suspicious bone lesions identified. IMPRESSION: 1. Acute pancreatitis. No signs of pancreatic necrosis or pseudocyst formation. 2. Mild gallbladder wall thickening without gallstones. 3. Mild fusiform dilatation of the common bile duct measuring up to 1.4 cm. No signs of choledocholithiasis at this time. 4. Hepatic and splenic cysts. 5. Small bilateral pleural effusions with overlying atelectasis. 6. 3.1 cm infrarenal abdominal aortic aneurysm. Recommend follow-up ultrasound every 3 years. This recommendation follows ACR consensus guidelines: White Paper of the ACR Incidental Findings Committee II on Vascular Findings. J Am Coll Radiol 2013; 10:789-794. Electronically Signed   By: Signa Kell M.D.   On: 02/23/2021 07:33   US ABDOMEN LIMITED RUQ (LIVER/GB)  Result Date: 02/21/2021 CLINICAL DATA:  Epigastric pain since yesterday. EXAM: ULTRASOUND ABDOMEN LIMITED RIGHT UPPER QUADRANT COMPARISON:  CT abdomen and pelvis 02/20/2021 FINDINGS: Gallbladder: Cholelithiasis with several small stones demonstrated in the gallbladder. No gallbladder wall thickening or edema. Murphy's sign is normal. Common bile duct: Diameter: 11 mm, dilated.  Similar to prior CT. Liver: Numerous cysts throughout the liver consistent with polycystic liver disease. No solid masses identified. Portal vein is patent on color Doppler imaging with normal direction of blood flow towards the liver. Other: None. IMPRESSION: Cholelithiasis without evidence of acute cholecystitis.  Polycystic liver disease. Moderate extrahepatic bile duct dilatation, cause not determined. Electronically Signed   By: Burman Nieves M.D.   On: 02/21/2021 23:07    Assessment/Plan Elevated troponin -  32 > 174 > 112, EKG without ischemia changes, echo pending   E.coli, Enterobacterales bacteremia - IV abx per sensitivities  Acute biliary pancreatitis, suspect resolving ascending cholangitis  - afebrile, HR and BP are WNL, WBC now 7.6 - LFT's, bilirubin, lipase trending down, re-check in AM - clinically the patient still has epigastric tenderness on exam and increased epigastric pain with intake of small amt clear liquids.  - may benefit from cholecystectomy this admission pending further improvement in pancreatitis and perioperative evaluation by cardiology.  - continue bowel rest and IV abx, I encouraged the patient not to force PO liquid intake if it exacerbated his epigastric pain. - echocardiogram pending, I have contacted cardiology for perioperative risk stratification and optimization.  - I discussed the risk and benefits of surgery with the patient and his family member extensively - risks including bleeding, infection, conversion to open, damage to surrounding structures, need for additional procedures, and the cardiac/pulmonary risks of anesthesia. They are still thinking about their wishes but at this time feel that, if his heart can tolerate it, they would want him to have a cholecystectomy this admission.     Travis Phenix, PA-C [facility] Please see Amion for pager number during day hours 7:00am-4:30pm 02/23/2021, 4:27 PM

## 2021-02-23 NOTE — Progress Notes (Signed)
PROGRESS NOTE    Travis Gallagher  NKN:[id number] DOB: 1931/07/03 DOA: 02/22/2021 PCP: Oneita Hurt, No    Brief Narrative:  Travis Gallagher is a 87 y.o. male with medical history significant for hypertension, GERD who is admitted to [facility] by way of transfer from [facility] ED for further evaluation and management of acute pancreatitis in setting of potential choledocholithiasis after presenting from home to [facility] ED complaining of abdominal pain  4/28- still with epigastric pain  Consultants:   GI  Procedures:   Antimicrobials:   Ceftriaxone and metronidazole   Subjective: Still with epigastric pain.  No nausea or vomiting.  Complaining of some headache  Objective: Vitals:   02/22/21 1613 02/22/21 1952  BP: (!) 108/50 (!) 157/62  Pulse: 74 80  Resp: 16 18  Temp: 98.6 F (37 C) 98.4 F (36.9 C)  TempSrc: Oral Oral  SpO2: 99% 95%    Intake/Output Summary (Last 24 hours) at 02/23/2021 0828 Last data filed at 02/23/2021 3790 Gross per 24 hour  Intake 1143.58 ml  Output --  Net 1143.58 ml   There were no vitals filed for this visit.  Examination:  General exam: Appears calm and comfortable  Respiratory system: Clear to auscultation. Respiratory effort normal. Cardiovascular system: S1 & S2 heard, RRR. No JVD, murmurs, rubs, gallops or clicks. No pedal edema. Gastrointestinal system: Abdomen is nondistended, soft and TTP at epigastric Normal bowel sounds heard. Central nervous system: Alert and oriented.grossly intact Extremities: no edema Skin: warm, dry Psychiatry: Judgement and insight appear normal. Mood & affect appropriate.     Data Reviewed: I have personally reviewed following labs and imaging studies  CBC: Recent Labs  Lab 02/20/21 1803 02/21/21 2021 02/22/21 1245 02/23/21 0126  WBC 6.4 14.7* 9.4 7.6  NEUTROABS 3.9 13.4* 8.4* 6.6  HGB 13.2 14.4 12.7* 12.7*  HCT 37.9* 41.4 38.0* 38.1*  MCV 85.6 85.4 89.2 89.0  PLT 144* 136*  95* 89*   Basic Metabolic Panel: Recent Labs  Lab 02/20/21 1803 02/21/21 2021 02/22/21 1245 02/23/21 0126  NA 138 137 138 138  K 3.6 3.7 3.9 3.6  CL 103 105 107 106  CO2 24 19* 24 20*  GLUCOSE 119* 136* 90 75  BUN 21 21 18 23   CREATININE 1.17 1.05 1.25* 1.27*  CALCIUM 8.6* 8.6* 8.0* 8.2*  MG  --   --  1.9 2.0   GFR: Estimated Creatinine Clearance: 36.8 mL/min (A) (by C-G formula based on SCr of 1.27 mg/dL (H)). Liver Function Tests: Recent Labs  Lab 02/20/21 1803 02/21/21 2021 02/22/21 1245 02/23/21 0126  AST 46* 210* 102* 66*  ALT 23 185* 125* 95*  ALKPHOS 106 209* 178* 169*  BILITOT 0.9 6.3* 4.7* 2.6*  PROT 7.3 7.7 6.1* 6.2*  ALBUMIN 3.5 3.5 2.8* 2.6*   Recent Labs  Lab 02/20/21 1803 02/21/21 2021 02/23/21 0126  LIPASE 80* 2,952* 382*   No results for input(s): AMMONIA in the last 168 hours. Coagulation Profile: No results for input(s): INR, PROTIME in the last 168 hours. Cardiac Enzymes: No results for input(s): CKTOTAL, CKMB, CKMBINDEX, TROPONINI in the last 168 hours. BNP (last 3 results) No results for input(s): PROBNP in the last 8760 hours. HbA1C: No results for input(s): HGBA1C in the last 72 hours. CBG: No results for input(s): GLUCAP in the last 168 hours. Lipid Profile: Recent Labs    02/22/21 1449  TRIG 35   Thyroid Function Tests: No results for input(s): TSH, T4TOTAL, FREET4, T3FREE, THYROIDAB in  the last 72 hours. Anemia Panel: No results for input(s): VITAMINB12, FOLATE, FERRITIN, TIBC, IRON, RETICCTPCT in the last 72 hours. Sepsis Labs: Recent Labs  Lab 02/21/21 2021 02/21/21 2207  PROCALCITON 0.96  --   LATICACIDVEN 2.4* 1.1    Recent Results (from the past 240 hour(s))  Blood culture (routine x 2)     Status: None (Preliminary result)   Collection Time: 02/21/21  8:21 PM   Specimen: BLOOD  Result Value Ref Range Status   Specimen Description BLOOD LEFT ANTECUBITAL  Final   Special Requests   Final    BOTTLES DRAWN  AEROBIC AND ANAEROBIC Blood Culture adequate volume   Culture  Setup Time   Final    Organism ID to follow GRAM NEGATIVE RODS IN BOTH AEROBIC AND ANAEROBIC BOTTLES CRITICAL RESULT CALLED TO, READ BACK BY AND VERIFIED WITH: AMY THOMPSON AT 0840 02/22/21 SDR Performed at [facility], [address] [place], [place] [address]    Culture GRAM NEGATIVE RODS  Final   Report Status PENDING  Incomplete  Blood Culture ID Panel (Reflexed)     Status: Abnormal   Collection Time: 02/21/21  8:21 PM  Result Value Ref Range Status   Enterococcus faecalis NOT DETECTED NOT DETECTED Final   Enterococcus Faecium NOT DETECTED NOT DETECTED Final   Listeria monocytogenes NOT DETECTED NOT DETECTED Final   Staphylococcus species NOT DETECTED NOT DETECTED Final   Staphylococcus aureus (BCID) NOT DETECTED NOT DETECTED Final   Staphylococcus epidermidis NOT DETECTED NOT DETECTED Final   Staphylococcus lugdunensis NOT DETECTED NOT DETECTED Final   Streptococcus species NOT DETECTED NOT DETECTED Final   Streptococcus agalactiae NOT DETECTED NOT DETECTED Final   Streptococcus pneumoniae NOT DETECTED NOT DETECTED Final   Streptococcus pyogenes NOT DETECTED NOT DETECTED Final   A.calcoaceticus-baumannii NOT DETECTED NOT DETECTED Final   Bacteroides fragilis NOT DETECTED NOT DETECTED Final   Enterobacterales DETECTED (A) NOT DETECTED Final    Comment: Enterobacterales represent a large order of gram negative bacteria, not a single organism. CRITICAL RESULT CALLED TO, READ BACK BY AND VERIFIED WITH:  AMY THOMPSON AT 0840 02/22/21 SDR    Enterobacter cloacae complex NOT DETECTED NOT DETECTED Final   Escherichia coli DETECTED (A) NOT DETECTED Final    Comment: CRITICAL RESULT CALLED TO, READ BACK BY AND VERIFIED WITH:  AMY THOMPSON AT 0840 02/22/21 SDR    Klebsiella aerogenes NOT DETECTED NOT DETECTED Final   Klebsiella oxytoca NOT DETECTED NOT DETECTED Final   Klebsiella pneumoniae NOT DETECTED NOT  DETECTED Final   Proteus species NOT DETECTED NOT DETECTED Final   Salmonella species NOT DETECTED NOT DETECTED Final   Serratia marcescens NOT DETECTED NOT DETECTED Final   Haemophilus influenzae NOT DETECTED NOT DETECTED Final   Neisseria meningitidis NOT DETECTED NOT DETECTED Final   Pseudomonas aeruginosa NOT DETECTED NOT DETECTED Final   Stenotrophomonas maltophilia NOT DETECTED NOT DETECTED Final   Candida albicans NOT DETECTED NOT DETECTED Final   Candida auris NOT DETECTED NOT DETECTED Final   Candida glabrata NOT DETECTED NOT DETECTED Final   Candida krusei NOT DETECTED NOT DETECTED Final   Candida parapsilosis NOT DETECTED NOT DETECTED Final   Candida tropicalis NOT DETECTED NOT DETECTED Final   Cryptococcus neoformans/gattii NOT DETECTED NOT DETECTED Final   CTX-M ESBL NOT DETECTED NOT DETECTED Final   Carbapenem resistance IMP NOT DETECTED NOT DETECTED Final   Carbapenem resistance KPC NOT DETECTED NOT DETECTED Final   Carbapenem resistance NDM NOT DETECTED NOT DETECTED  Final   Carbapenem resist OXA 48 LIKE NOT DETECTED NOT DETECTED Final   Carbapenem resistance VIM NOT DETECTED NOT DETECTED Final    Comment: Performed at [facility], [address] [place], [place] [address]  Blood culture (routine x 2)     Status: None (Preliminary result)   Collection Time: 02/21/21  8:39 PM   Specimen: BLOOD  Result Value Ref Range Status   Specimen Description BLOOD BLOOD RIGHT WRIST  Final   Special Requests   Final    BOTTLES DRAWN AEROBIC AND ANAEROBIC Blood Culture adequate volume   Culture  Setup Time   Final    GRAM NEGATIVE RODS IN BOTH AEROBIC AND ANAEROBIC BOTTLES CRITICAL VALUE NOTED.  VALUE IS CONSISTENT WITH PREVIOUSLY REPORTED AND CALLED VALUE. Performed at [facility], [address] [place], [place] [address]    Culture GRAM NEGATIVE RODS  Final   Report Status PENDING  Incomplete  Resp Panel by RT-PCR (Flu A&B, Covid) Nasopharyngeal Swab      Status: None   Collection Time: 02/21/21  8:39 PM   Specimen: Nasopharyngeal Swab; Nasopharyngeal(NP) swabs in vial transport medium  Result Value Ref Range Status   SARS Coronavirus 2 by RT PCR NEGATIVE NEGATIVE Final    Comment: (NOTE) SARS-CoV-2 target nucleic acids are NOT DETECTED.  The SARS-CoV-2 RNA is generally detectable in upper respiratory specimens during the acute phase of infection. The lowest concentration of SARS-CoV-2 viral copies this assay can detect is 138 copies/mL. A negative result does not preclude SARS-Cov-2 infection and should not be used as the sole basis for treatment or other patient management decisions. A negative result may occur with  improper specimen collection/handling, submission of specimen other than nasopharyngeal swab, presence of viral mutation(s) within the areas targeted by this assay, and inadequate number of viral copies(<138 copies/mL). A negative result must be combined with clinical observations, patient history, and epidemiological information. The expected result is Negative.  Fact Sheet for Patients:  BloggerCourse.comhttps://www.fda.gov/media/152166/download  Fact Sheet for Healthcare Providers:  SeriousBroker.ithttps://www.fda.gov/media/152162/download  This test is no t yet approved or cleared by the [place] FDA and  has been authorized for detection and/or diagnosis of SARS-CoV-2 by FDA under an Emergency Use Authorization (EUA). This EUA will remain  in effect (meaning this test can be used) for the duration of the COVID-19 declaration under Section 564(b)(1) of the Act, 21 U.S.C.section 360bbb-3(b)(1), unless the authorization is terminated  or revoked sooner.       Influenza A by PCR NEGATIVE NEGATIVE Final   Influenza B by PCR NEGATIVE NEGATIVE Final    Comment: (NOTE) The Xpert Xpress SARS-CoV-2/FLU/RSV plus assay is intended as an aid in the diagnosis of influenza from Nasopharyngeal swab specimens and should not be used as a sole basis  for treatment. Nasal washings and aspirates are unacceptable for Xpert Xpress SARS-CoV-2/FLU/RSV testing.  Fact Sheet for Patients: BloggerCourse.comhttps://www.fda.gov/media/152166/download  Fact Sheet for Healthcare Providers: SeriousBroker.ithttps://www.fda.gov/media/152162/download  This test is not yet approved or cleared by the [place] FDA and has been authorized for detection and/or diagnosis of SARS-CoV-2 by FDA under an Emergency Use Authorization (EUA). This EUA will remain in effect (meaning this test can be used) for the duration of the COVID-19 declaration under Section 564(b)(1) of the Act, 21 U.S.C. section 360bbb-3(b)(1), unless the authorization is terminated or revoked.  Performed at [facility], [address] [place], [place] [address]          Radiology Studies: DG Chest  2 View  Result Date: 02/21/2021 CLINICAL DATA:  Worsening abdominal pain radiating into the chest. EXAM: CHEST - 2 VIEW COMPARISON:  CT abdomen pelvis February 20, 2021 FINDINGS: The heart size and mediastinal contours are within normal limits. Low lung volumes. Streaky left basilar opacities. No visible pleural effusion or pneumothorax. Thoracic spondylosis. Degenerative changes bilateral shoulders. IMPRESSION: Low lung volumes with streaky left basilar opacities, which may represent atelectasis or infiltrate. Electronically Signed   By: Maudry Mayhew MD   On: 02/21/2021 20:44   CT Angio Chest PE W and/or Wo Contrast  Result Date: 02/21/2021 CLINICAL DATA:  Worsening abdominal pain radiating to the chest. EXAM: CT ANGIOGRAPHY CHEST WITH CONTRAST TECHNIQUE: Multidetector CT imaging of the chest was performed using the standard protocol during bolus administration of intravenous contrast. Multiplanar CT image reconstructions and MIPs were obtained to evaluate the vascular anatomy. CONTRAST:  75mL OMNIPAQUE IOHEXOL 350 MG/ML SOLN COMPARISON:  CT abdomen pelvis February 20, 2021. FINDINGS: Cardiovascular: Satisfactory  opacification of the pulmonary arteries to the segmental level. No evidence of pulmonary embolism. Aortic atherosclerosis. The ascending aorta measures 3.4 cm in maximal diameter. There are few areas of focal enlargement involving the thoracic aorta 1 in the aortic arch measures 3.6 cm in maximal diameter and another in the descending aorta measures 3.6 cm in maximal diameter. The heart size. No pericardial effusion. Mediastinum/Nodes: No enlarged mediastinal, hilar, or axillary lymph nodes. Thyroid gland, trachea, and esophagus demonstrate no significant findings. Lungs/Pleura: Bibasilar atelectasis. Left upper lobe calcified granuloma. Mild emphysematous change. No pleural effusion. No pneumothorax. Upper Abdomen: Polycystic liver disease. Distended gallbladder without signs gallbladder inflammation. 2.9 cm cyst in the spleen. Musculoskeletal: Multilevel degenerative changes spine. No acute osseous abnormality. Review of the MIP images confirms the above findings. IMPRESSION: 1. No evidence of pulmonary embolism. 2. Focal areas of aortic ectasia involving the thoracic aorta, involving the aortic arch and descending aorta both of which measure 3.6 cm in maximal diameter. Recommend annual imaging followup by CTA or MRA. This recommendation follows 2010 ACCF/AHA/AATS/ACR/ASA/SCA/SCAI/SIR/STS/SVM Guidelines for the Diagnosis and Management of Patients with Thoracic Aortic Disease. Circulation.2010; 121: Q657-Q469. Aortic aneurysm NOS (ICD10-I71.9) 3. Polycystic liver disease. 4. Distended gallbladder without signs gallbladder inflammation. 5. Emphysema and aortic atherosclerosis. Aortic Atherosclerosis (ICD10-I70.0) and Emphysema (ICD10-J43.9). Electronically Signed   By: Maudry Mayhew MD   On: 02/21/2021 23:43   CT ABDOMEN PELVIS W CONTRAST  Result Date: 02/21/2021 CLINICAL DATA:  Persistent abdominal pain EXAM: CT ABDOMEN AND PELVIS WITH CONTRAST TECHNIQUE: Multidetector CT imaging of the abdomen and pelvis was  performed using the standard protocol following bolus administration of intravenous contrast. CONTRAST:  75mL OMNIPAQUE IOHEXOL 350 MG/ML SOLN COMPARISON:  February 20, 2021 and same-day ultrasound FINDINGS: Lower chest: Bibasilar atelectasis. Hepatobiliary: Numerous hepatic cysts consistent with polycystic liver disease. Gallbladder is distended without findings of acute inflammation. Similar dilation of the common duct measuring up to 10 mm Pancreas: Peripancreatic stranding. No pancreatic ductal dilation. No walled off collection. Spleen: 2. 9 cm cyst in the spleen Adrenals/Urinary Tract: Adrenal glands are unremarkable. Kidneys are normal, without renal calculi, focal lesion, or hydronephrosis. Bladder is unremarkable. Stomach/Bowel: Similar sub mucosal edema in the proximal stomach. No pathologically dilated loops of small bowel. Normal appendix. No suspicious colonic wall thickening or mass like lesions. Vascular/Lymphatic: The portal vein, splenic vein and SMV are patent. Aortic atherosclerosis. Infrarenal abdominal aortic aneurysm measuring 3 cm on image 40/2. 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 No pathologically  enlarged abdominal or pelvic lymph nodes. Reproductive: Mild prostatic enlargement. Other: Peripancreatic fluid. No abdominal ascites or walled off fluid collections. No pneumoperitoneum. Musculoskeletal: Multilevel degenerative changes spine. No acute osseous abnormality. IMPRESSION: 1. Peripancreatic stranding most consistent with acute interstitial pancreatitis, recommend correlation with serum lipase. No walled off fluid collections or evidence of pancreatic necrosis. 2. Similar submucosal edema in the proximal stomach suggestive of gastritis. 3. Stable dilation of the common duct measuring up to 10 mm, favored to represent benign senescent dilation. 4. 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 5. Infrarenal abdominal aortic aneurysm  measuring 3 cm. Recommend follow-up ultrasound every 3 years. This recommendation follows ACR consensus guidelines: White Paper of the ACR Incidental Findings Committee II on Vascular Findings. J Am Coll Radiol 2013; 10:789-794. 6. Aortic atherosclerosis.  Aortic Atherosclerosis (ICD10-I70.0). Electronically Signed   By: Maudry Mayhew MD   On: 02/21/2021 23:53   MR ABDOMEN MRCP W WO CONTAST  Result Date: 02/23/2021 CLINICAL DATA:  Evaluate for choledocholithiasis. EXAM: MRI ABDOMEN WITHOUT AND WITH CONTRAST (INCLUDING MRCP) TECHNIQUE: Multiplanar multisequence MR imaging of the abdomen was performed both before and after the administration of intravenous contrast. Heavily T2-weighted images of the biliary and pancreatic ducts were obtained, and three-dimensional MRCP images were rendered by post processing. CONTRAST:  6.8mL GADAVIST GADOBUTROL 1 MMOL/ML IV SOLN COMPARISON:  02/21/2021 FINDINGS: Exam detail is diminished due to motion artifact. Lower chest: Small bilateral pleural effusions with overlying atelectasis. Hepatobiliary: Innumerable cysts are identified throughout both lobes of liver compatible with polycystic liver disease. Mild gallbladder wall thickening measures up to 4.6 mm. No gallstones identified. Fusiform dilatation of the common bile duct measures up to 1.4 cm. No signs of choledocholithiasis at this time. Pancreas: Interstitial edema and peripancreatic fat stranding identified. No main duct dilatation. No signs of pancreatic necrosis or pseudocyst formation. No pancreatic mass identified at this time. Spleen: 3 cm cyst identified. Spleen is otherwise normal in appearance and size. Adrenals/Urinary Tract: Normal appearance of the adrenal glands. No hydronephrosis identified bilaterally. Stomach/Bowel: Visualized portions within the abdomen are unremarkable. Vascular/Lymphatic: Extensive aortic atherosclerosis. Infrarenal abdominal aortic aneurysm measures 3.1 cm. No abdominal adenopathy.  Other: Soft tissue edema extends from the pancreas into the retroperitoneal fat bilaterally. Mild perisplenic and perihepatic ascites. Musculoskeletal: No suspicious bone lesions identified. IMPRESSION: 1. Acute pancreatitis. No signs of pancreatic necrosis or pseudocyst formation. 2. Mild gallbladder wall thickening without gallstones. 3. Mild fusiform dilatation of the common bile duct measuring up to 1.4 cm. No signs of choledocholithiasis at this time. 4. Hepatic and splenic cysts. 5. Small bilateral pleural effusions with overlying atelectasis. 6. 3.1 cm infrarenal abdominal aortic aneurysm. Recommend follow-up ultrasound every 3 years. This recommendation follows ACR consensus guidelines: White Paper of the ACR Incidental Findings Committee II on Vascular Findings. J Am Coll Radiol 2013; 10:789-794. Electronically Signed   By: Signa Kell M.D.   On: 02/23/2021 07:33   US ABDOMEN LIMITED RUQ (LIVER/GB)  Result Date: 02/21/2021 CLINICAL DATA:  Epigastric pain since yesterday. EXAM: ULTRASOUND ABDOMEN LIMITED RIGHT UPPER QUADRANT COMPARISON:  CT abdomen and pelvis 02/20/2021 FINDINGS: Gallbladder: Cholelithiasis with several small stones demonstrated in the gallbladder. No gallbladder wall thickening or edema. Murphy's sign is normal. Common bile duct: Diameter: 11 mm, dilated.  Similar to prior CT. Liver: Numerous cysts throughout the liver consistent with polycystic liver disease. No solid masses identified. Portal vein is patent on color Doppler imaging with normal direction of blood flow towards the liver.  Other: None. IMPRESSION: Cholelithiasis without evidence of acute cholecystitis. Polycystic liver disease. Moderate extrahepatic bile duct dilatation, cause not determined. Electronically Signed   By: Burman Nieves M.D.   On: 02/21/2021 23:07        Scheduled Meds: . pantoprazole (PROTONIX) IV  40 mg Intravenous Q24H   Continuous Infusions: . cefTRIAXone (ROCEPHIN)  IV Stopped (02/23/21  0036)  . lactated ringers 75 mL/hr at 02/23/21 1610  . metronidazole Stopped (02/23/21 0636)    Assessment & Plan:   Principal Problem:   Acute pancreatitis Active Problems:   Severe sepsis (HCC)   Cholangitis   Abdominal pain   Nausea   Hypertension   GERD (gastroesophageal reflux disease)   Elevated troponin   #) Acute biliary pancreatitis:  GI Consulted, input was appreciated. Presumed biliary in the setting of cholelithiasis, dilated CBD.  None of the recent imaging confirms actual choledocholelithiasis so may have passed a stone ?cholangitis-on metro and rocephin Continue IVF for hydration Will consult gsx for lap chole with IOC prior to dc. LFT improving.  Continue to monitor Start clears.      #) Severe sepsis bcx +Ecoli. Possibly 2/2 above Continue iv abx. F/u sensitivities ucx insignificant growth Continue ivf Hemodynamically stable        #) Elevated troponin: Possibly secondary to demand ischemia  EKG no ischemic changes  Will obtain echo , hold cardiology consult until echo completed.  Troponin now trending down           #) Essential hypertension: Not on any antihypertensive medications at home. bp low to nml. Spike of high possibly 2/2 pain Monitor        #) GERD: On Pepcid as an outpatient. Continue on ivppi      DVT prophylaxis: SCD Code Status: Full Family Communication: Family at bedside  Status is: Inpatient  Remains inpatient appropriate because:Inpatient level of care appropriate due to severity of illness   Dispo: The patient is from: Home              Anticipated d/c is to: Home              Patient currently is not medically stable to d/c.   Difficult to place patient No            LOS: 1 day   Time spent: 45 minutes with more than 50% on COC    Lynn Ito, MD [facility] Pager [phone]  If 7PM-7AM, please contact night-coverage 02/23/2021, 8:28 AM

## 2021-02-23 NOTE — Progress Notes (Signed)
Diet advanced to clear liquids; patient ate entire lunch tray and tolerated well. GI consulted with patient and family members while translator was present. Med administration educated with Science writer. Patient's pain managed with prn tylenol, main complaint of head pain.

## 2021-02-24 ENCOUNTER — Inpatient Hospital Stay (HOSPITAL_COMMUNITY): Payer: Self-pay

## 2021-02-24 DIAGNOSIS — R778 Other specified abnormalities of plasma proteins: Secondary | ICD-10-CM

## 2021-02-24 DIAGNOSIS — R079 Chest pain, unspecified: Secondary | ICD-10-CM

## 2021-02-24 DIAGNOSIS — R7881 Bacteremia: Secondary | ICD-10-CM

## 2021-02-24 DIAGNOSIS — Z01810 Encounter for preprocedural cardiovascular examination: Secondary | ICD-10-CM

## 2021-02-24 LAB — TROPONIN I (HIGH SENSITIVITY)
Troponin I (High Sensitivity): 45 ng/L — ABNORMAL HIGH (ref <18)
Troponin I (High Sensitivity): 41 ng/L — ABNORMAL HIGH (ref <18)

## 2021-02-24 LAB — CBC
HCT: 34.5 % — ABNORMAL LOW (ref 39.0–52.0)
Hemoglobin: 12.1 g/dL — ABNORMAL LOW (ref 13.0–17.0)
MCH: 30.3 pg (ref 26.0–34.0)
MCHC: 35.1 g/dL (ref 30.0–36.0)
MCV: 86.5 fL (ref 80.0–100.0)
Platelets: 80 10*3/uL — ABNORMAL LOW (ref 150–400)
RBC: 3.99 MIL/uL — ABNORMAL LOW (ref 4.22–5.81)
RDW: 12.7 % (ref 11.5–15.5)
WBC: 9.5 10*3/uL (ref 4.0–10.5)
nRBC: 0 % (ref 0.0–0.2)

## 2021-02-24 LAB — COMPREHENSIVE METABOLIC PANEL
ALT: 56 U/L — ABNORMAL HIGH (ref 0–44)
AST: 30 U/L (ref 15–41)
Albumin: 2.1 g/dL — ABNORMAL LOW (ref 3.5–5.0)
Alkaline Phosphatase: 137 U/L — ABNORMAL HIGH (ref 38–126)
Anion gap: 7 (ref 5–15)
BUN: 18 mg/dL (ref 8–23)
CO2: 23 mmol/L (ref 22–32)
Calcium: 8.1 mg/dL — ABNORMAL LOW (ref 8.9–10.3)
Chloride: 106 mmol/L (ref 98–111)
Creatinine, Ser: 1.07 mg/dL (ref 0.61–1.24)
GFR, Estimated: >60 mL/min (ref >60)
Glucose, Bld: 104 mg/dL — ABNORMAL HIGH (ref 70–99)
Potassium: 3.2 mmol/L — ABNORMAL LOW (ref 3.5–5.1)
Sodium: 136 mmol/L (ref 135–145)
Total Bilirubin: 1.2 mg/dL (ref 0.3–1.2)
Total Protein: 5.5 g/dL — ABNORMAL LOW (ref 6.5–8.1)

## 2021-02-24 LAB — ECHOCARDIOGRAM COMPLETE
Area-P 1/2: 5.02 cm2
Calc EF: 51.5 %
MV M vel: 5.54 m/s
MV Peak grad: 122.8 mmHg
P 1/2 time: 406 msec
Radius: 0.5 cm
S' Lateral: 2.2 cm
Single Plane A2C EF: 55.9 %
Single Plane A4C EF: 42.8 %

## 2021-02-24 LAB — CULTURE, BLOOD (ROUTINE X 2)
Special Requests: ADEQUATE
Special Requests: ADEQUATE

## 2021-02-24 MED ORDER — MUPIROCIN 2 % EX OINT
1.0000 "application " | TOPICAL_OINTMENT | Freq: Two times a day (BID) | CUTANEOUS | Status: AC
  Administered 2021-02-24 – 2021-02-28 (×9): 1 via NASAL
  Filled 2021-02-24 (×4): qty 22

## 2021-02-24 MED ORDER — POTASSIUM CHLORIDE 10 MEQ/100ML IV SOLN
10.0000 meq | INTRAVENOUS | Status: AC
  Administered 2021-02-24 (×2): 10 meq via INTRAVENOUS
  Filled 2021-02-24 (×2): qty 100

## 2021-02-24 MED ORDER — CARVEDILOL 3.125 MG PO TABS: 3.1250 mg | ORAL_TABLET | Freq: Two times a day (BID) | ORAL | Status: DC

## 2021-02-24 MED ORDER — POTASSIUM CHLORIDE CRYS ER 20 MEQ PO TBCR
40.0000 meq | EXTENDED_RELEASE_TABLET | Freq: Once | ORAL | Status: AC
  Administered 2021-02-24: 40 meq via ORAL
  Filled 2021-02-24: qty 2

## 2021-02-24 MED ORDER — CHLORHEXIDINE GLUCONATE CLOTH 2 % EX PADS
6.0000 | MEDICATED_PAD | Freq: Every day | CUTANEOUS | Status: AC
  Administered 2021-02-24 – 2021-02-28 (×4): 6 via TOPICAL

## 2021-02-24 NOTE — Progress Notes (Signed)
Progress Note     Subjective: CC: still with abdominal pain which is worse with oral intake. He denies nausea or emesis. He denies chest pain or SHOB on supplemental O2. He has been minimally ambulatory.  Due to language barrier interpretor services were used for interview and physical exam  Objective: Vital signs in last 24 hours: Temp:  [97.7 F (36.5 C)-99.9 F (37.7 C)] 98.8 F (37.1 C) (04/29 0319) Pulse Rate:  [82-100] 84 (04/29 0319) Resp:  [18] 18 (04/29 0319) BP: (160-186)/(72-84) 171/84 (04/29 0319) SpO2:  [91 %-97 %] 97 % (04/29 0319)    Intake/Output from previous day: 04/28 0701 - 04/29 0700 In: 75 [P.O.:75] Out: 300 [Urine:300] Intake/Output this shift: Total I/O In: -  Out: 250 [Urine:250]  PE: General: pleasant, WD, male who is laying in bed in NAD HEENT: head is normocephalic, atraumatic.  Sclera are noninjected.  Ears and nose without any masses or lesions.  Mouth is pink and moist Heart: regular, rate, and rhythm. No obvious murmurs, gallops, or rubs noted.  Palpable radial and pedal pulses bilaterally Lungs: CTAB, no wheezes, rhonchi, or rales noted.  Respiratory effort nonlabored on supplemental O2 via [place] Abd: soft, +BS, no masses, hernias, or organomegaly. Mild distension. TTP with guarding of epigastric region MS: all 4 extremities are symmetrical with no cyanosis, clubbing, or edema. No calf TTP Skin: warm and dry with no masses, lesions, or rashes Neuro: Cranial nerves 2-12 grossly intact, sensation is normal throughout Psych: A&Ox3 with an appropriate affect.    Lab Results:  Recent Labs    02/23/21 0126 02/24/21 0455  WBC 7.6 9.5  HGB 12.7* 12.1*  HCT 38.1* 34.5*  PLT 89* 80*   BMET Recent Labs    02/23/21 0126 02/24/21 0455  NA 138 136  K 3.6 3.2*  CL 106 106  CO2 20* 23  GLUCOSE 75 104*  BUN 23 18  CREATININE 1.27* 1.07  CALCIUM 8.2* 8.1*   PT/INR No results for input(s): LABPROT, INR in the last 72 hours. CMP      Component Value Date/Time   NA 136 02/24/2021 0455   K 3.2 (L) 02/24/2021 0455   CL 106 02/24/2021 0455   CO2 23 02/24/2021 0455   GLUCOSE 104 (H) 02/24/2021 0455   BUN 18 02/24/2021 0455   CREATININE 1.07 02/24/2021 0455   CALCIUM 8.1 (L) 02/24/2021 0455   PROT 5.5 (L) 02/24/2021 0455   ALBUMIN 2.1 (L) 02/24/2021 0455   AST 30 02/24/2021 0455   ALT 56 (H) 02/24/2021 0455   ALKPHOS 137 (H) 02/24/2021 0455   BILITOT 1.2 02/24/2021 0455   GFRNONAA >60 02/24/2021 0455   Lipase     Component Value Date/Time   LIPASE 382 (H) 02/23/2021 0126       Studies/Results: MR ABDOMEN MRCP W WO CONTAST  Result Date: 02/23/2021 CLINICAL DATA:  Evaluate for choledocholithiasis. EXAM: MRI ABDOMEN WITHOUT AND WITH CONTRAST (INCLUDING MRCP) TECHNIQUE: Multiplanar multisequence MR imaging of the abdomen was performed both before and after the administration of intravenous contrast. Heavily T2-weighted images of the biliary and pancreatic ducts were obtained, and three-dimensional MRCP images were rendered by post processing. CONTRAST:  6.65mL GADAVIST GADOBUTROL 1 MMOL/ML IV SOLN COMPARISON:  02/21/2021 FINDINGS: Exam detail is diminished due to motion artifact. Lower chest: Small bilateral pleural effusions with overlying atelectasis. Hepatobiliary: Innumerable cysts are identified throughout both lobes of liver compatible with polycystic liver disease. Mild gallbladder wall thickening measures up to 4.6 mm. No gallstones  identified. Fusiform dilatation of the common bile duct measures up to 1.4 cm. No signs of choledocholithiasis at this time. Pancreas: Interstitial edema and peripancreatic fat stranding identified. No main duct dilatation. No signs of pancreatic necrosis or pseudocyst formation. No pancreatic mass identified at this time. Spleen: 3 cm cyst identified. Spleen is otherwise normal in appearance and size. Adrenals/Urinary Tract: Normal appearance of the adrenal glands. No hydronephrosis  identified bilaterally. Stomach/Bowel: Visualized portions within the abdomen are unremarkable. Vascular/Lymphatic: Extensive aortic atherosclerosis. Infrarenal abdominal aortic aneurysm measures 3.1 cm. No abdominal adenopathy. Other: Soft tissue edema extends from the pancreas into the retroperitoneal fat bilaterally. Mild perisplenic and perihepatic ascites. Musculoskeletal: No suspicious bone lesions identified. IMPRESSION: 1. Acute pancreatitis. No signs of pancreatic necrosis or pseudocyst formation. 2. Mild gallbladder wall thickening without gallstones. 3. Mild fusiform dilatation of the common bile duct measuring up to 1.4 cm. No signs of choledocholithiasis at this time. 4. Hepatic and splenic cysts. 5. Small bilateral pleural effusions with overlying atelectasis. 6. 3.1 cm infrarenal abdominal aortic aneurysm. Recommend follow-up ultrasound every 3 years. This recommendation follows ACR consensus guidelines: White Paper of the ACR Incidental Findings Committee II on Vascular Findings. J Am Coll Radiol 2013; 10:789-794. Electronically Signed   By: Signa Kell M.D.   On: 02/23/2021 07:33    Anti-infectives: Anti-infectives (From admission, onward)   Start     Dose/Rate Route Frequency Ordered Stop   02/22/21 2000  cefTRIAXone (ROCEPHIN) 2 g in sodium chloride 0.9 % 100 mL IVPB        2 g 200 mL/hr over 30 Minutes Intravenous Daily at 10 pm 02/22/21 1339     02/22/21 1600  metroNIDAZOLE (FLAGYL) IVPB 500 mg        500 mg 100 mL/hr over 60 Minutes Intravenous Every 8 hours 02/22/21 1339         Assessment/Plan  Elevated troponin -  32 > 174 > 112, EKG without ischemia changes, echo pending   E.coli, Enterobacterales bacteremia - IV abx per sensitivities  Acute biliary pancreatitis, suspect resolving ascending cholangitis  - afebrile, HR and BP are WNL, WBC normal - LFT's trending down, re-check in AM. Bilirubin normal (2.6) - continues to have epigastric tenderness and worsened  pain with intake of clears - may benefit from cholecystectomy this admission pending further improvement in pancreatitis and perioperative evaluation by cardiology.  - continue bowel rest and IV abx. Encouraged patient to go slow with liquids as long as pain persists - echocardiogram pending, cardiology contacted for perioperative risk stratification and optimization.  - patient and family would prefer lap chole during admission if cleared from cardiology standpoint  FEN: CLD, LR 75 mL/hr ID: rocephin 4/27>, Flagyl 4/27> VTE: SCDs    LOS: 2 days    Eric Form, [facility] 02/24/2021, 8:26 AM Please see Amion for pager number during day hours 7:00am-4:30pm

## 2021-02-24 NOTE — Progress Notes (Signed)
PROGRESS NOTE    Travis Gallagher  ZOX:[id number] DOB: 1931/06/01 DOA: 02/22/2021 PCP: Oneita Hurt, No    Brief Narrative:  Travis Gallagher is a 87 y.o. male with medical history significant for hypertension, GERD who is admitted to [facility] by way of transfer from [facility] ED for further evaluation and management of acute pancreatitis in setting of potential choledocholithiasis after presenting from home to [facility] ED complaining of abdominal pain  4/29-still with epigastric pain. Tele 8 beats svt  Consultants:   GI, cardiology  Procedures:   Antimicrobials:   Ceftriaxone and metronidazole   Subjective: epigatric pain, no nausea or vomiting  Objective: Vitals:   02/23/21 1954 02/23/21 2101 02/24/21 0951 02/24/21 1337  BP: (!) 186/84 (!) 160/74 (!) 176/81 (!) 179/75  Pulse: 100 92 84 96  Resp:  18  (!) 24  Temp: 99.9 F (37.7 C) 98.7 F (37.1 C)  97.9 F (36.6 C)  TempSrc: Oral   Oral  SpO2: 91% 96% 94% 93%    Intake/Output Summary (Last 24 hours) at 02/24/2021 1719 Last data filed at 02/24/2021 1654 Gross per 24 hour  Intake 1035 ml  Output 800 ml  Net 235 ml   There were no vitals filed for this visit.  Examination: Calm, comfortable  CTA no wheeze rales rhonchi's Regular S1-S2 no gallops Soft, mild epigastric tenderness to palpation positive bowel sounds No edema Awake and alert      Data Reviewed: I have personally reviewed following labs and imaging studies  CBC: Recent Labs  Lab 02/20/21 1803 02/21/21 2021 02/22/21 1245 02/23/21 0126 02/24/21 0455  WBC 6.4 14.7* 9.4 7.6 9.5  NEUTROABS 3.9 13.4* 8.4* 6.6  --   HGB 13.2 14.4 12.7* 12.7* 12.1*  HCT 37.9* 41.4 38.0* 38.1* 34.5*  MCV 85.6 85.4 89.2 89.0 86.5  PLT 144* 136* 95* 89* 80*   Basic Metabolic Panel: Recent Labs  Lab 02/20/21 1803 02/21/21 2021 02/22/21 1245 02/23/21 0126 02/24/21 0455  NA 138 137 138 138 136  K 3.6 3.7 3.9 3.6 3.2*  CL 103 105 107 106 106   CO2 24 19* 24 20* 23  GLUCOSE 119* 136* 90 75 104*  BUN CREATININE 1.17 1.05 1.25* 1.27* 1.07  CALCIUM 8.6* 8.6* 8.0* 8.2* 8.1*  MG  --   --  1.9 2.0  --    GFR: Estimated Creatinine Clearance: 43.7 mL/min (by C-G formula based on SCr of 1.07 mg/dL). Liver Function Tests: Recent Labs  Lab 02/20/21 1803 02/21/21 2021 02/22/21 1245 02/23/21 0126 02/24/21 0455  AST 46* 210* 102* 66* 30  ALT 23 185* 125* 95* 56*  ALKPHOS 106 209* 178* 169* 137*  BILITOT 0.9 6.3* 4.7* 2.6* 1.2  PROT 7.3 7.7 6.1* 6.2* 5.5*  ALBUMIN 3.5 3.5 2.8* 2.6* 2.1*   Recent Labs  Lab 02/20/21 1803 02/21/21 2021 02/23/21 0126  LIPASE 80* 2,952* 382*   No results for input(s): AMMONIA in the last 168 hours. Coagulation Profile: No results for input(s): INR, PROTIME in the last 168 hours. Cardiac Enzymes: No results for input(s): CKTOTAL, CKMB, CKMBINDEX, TROPONINI in the last 168 hours. BNP (last 3 results) No results for input(s): PROBNP in the last 8760 hours. HbA1C: No results for input(s): HGBA1C in the last 72 hours. CBG: No results for input(s): GLUCAP in the last 168 hours. Lipid Profile: Recent Labs    02/22/21 1449  TRIG 35   Thyroid Function Tests: No results for input(s): TSH,  T4TOTAL, FREET4, T3FREE, THYROIDAB in the last 72 hours. Anemia Panel: No results for input(s): VITAMINB12, FOLATE, FERRITIN, TIBC, IRON, RETICCTPCT in the last 72 hours. Sepsis Labs: Recent Labs  Lab 02/21/21 2021 02/21/21 2207  PROCALCITON 0.96  --   LATICACIDVEN 2.4* 1.1    Recent Results (from the past 240 hour(s))  Blood culture (routine x 2)     Status: Abnormal   Collection Time: 02/21/21  8:21 PM   Specimen: BLOOD  Result Value Ref Range Status   Specimen Description   Final    BLOOD LEFT ANTECUBITAL Performed at [facility], [address] [place], [place] [address]    Special Requests   Final    BOTTLES DRAWN AEROBIC AND ANAEROBIC Blood Culture adequate  volume Performed at [facility], [address] [place], [place] [address]    Culture  Setup Time   Final    GRAM NEGATIVE RODS IN BOTH AEROBIC AND ANAEROBIC BOTTLES CRITICAL RESULT CALLED TO, READ BACK BY AND VERIFIED WITH: AMY THOMPSON AT 0840 02/22/21 SDR Performed at [facility], [address], [place], [place] [address]    Culture ESCHERICHIA COLI (A)  Final   Report Status 02/24/2021 FINAL  Final   Organism ID, Bacteria ESCHERICHIA COLI  Final      Susceptibility   Escherichia coli - MIC*    AMPICILLIN 8 SENSITIVE Sensitive     CEFAZOLIN <=4 SENSITIVE Sensitive     CEFEPIME <=0.12 SENSITIVE Sensitive     CEFTAZIDIME <=1 SENSITIVE Sensitive     CEFTRIAXONE <=0.25 SENSITIVE Sensitive     CIPROFLOXACIN <=0.25 SENSITIVE Sensitive     GENTAMICIN <=1 SENSITIVE Sensitive     IMIPENEM <=0.25 SENSITIVE Sensitive     TRIMETH/SULFA <=20 SENSITIVE Sensitive     AMPICILLIN/SULBACTAM 4 SENSITIVE Sensitive     PIP/TAZO <=4 SENSITIVE Sensitive     * ESCHERICHIA COLI  Blood Culture ID Panel (Reflexed)     Status: Abnormal   Collection Time: 02/21/21  8:21 PM  Result Value Ref Range Status   Enterococcus faecalis NOT DETECTED NOT DETECTED Final   Enterococcus Faecium NOT DETECTED NOT DETECTED Final   Listeria monocytogenes NOT DETECTED NOT DETECTED Final   Staphylococcus species NOT DETECTED NOT DETECTED Final   Staphylococcus aureus (BCID) NOT DETECTED NOT DETECTED Final   Staphylococcus epidermidis NOT DETECTED NOT DETECTED Final   Staphylococcus lugdunensis NOT DETECTED NOT DETECTED Final   Streptococcus species NOT DETECTED NOT DETECTED Final   Streptococcus agalactiae NOT DETECTED NOT DETECTED Final   Streptococcus pneumoniae NOT DETECTED NOT DETECTED Final   Streptococcus pyogenes NOT DETECTED NOT DETECTED Final   A.calcoaceticus-baumannii NOT DETECTED NOT DETECTED Final   Bacteroides fragilis NOT DETECTED NOT DETECTED Final   Enterobacterales DETECTED (A) NOT  DETECTED Final    Comment: Enterobacterales represent a large order of gram negative bacteria, not a single organism. CRITICAL RESULT CALLED TO, READ BACK BY AND VERIFIED WITH:  AMY THOMPSON AT 0840 02/22/21 SDR    Enterobacter cloacae complex NOT DETECTED NOT DETECTED Final   Escherichia coli DETECTED (A) NOT DETECTED Final    Comment: CRITICAL RESULT CALLED TO, READ BACK BY AND VERIFIED WITH:  AMY THOMPSON AT 0840 02/22/21 SDR    Klebsiella aerogenes NOT DETECTED NOT DETECTED Final   Klebsiella oxytoca NOT DETECTED NOT DETECTED Final   Klebsiella pneumoniae NOT DETECTED NOT DETECTED Final   Proteus species NOT DETECTED NOT DETECTED Final   Salmonella species NOT DETECTED NOT DETECTED Final  Serratia marcescens NOT DETECTED NOT DETECTED Final   Haemophilus influenzae NOT DETECTED NOT DETECTED Final   Neisseria meningitidis NOT DETECTED NOT DETECTED Final   Pseudomonas aeruginosa NOT DETECTED NOT DETECTED Final   Stenotrophomonas maltophilia NOT DETECTED NOT DETECTED Final   Candida albicans NOT DETECTED NOT DETECTED Final   Candida auris NOT DETECTED NOT DETECTED Final   Candida glabrata NOT DETECTED NOT DETECTED Final   Candida krusei NOT DETECTED NOT DETECTED Final   Candida parapsilosis NOT DETECTED NOT DETECTED Final   Candida tropicalis NOT DETECTED NOT DETECTED Final   Cryptococcus neoformans/gattii NOT DETECTED NOT DETECTED Final   CTX-M ESBL NOT DETECTED NOT DETECTED Final   Carbapenem resistance IMP NOT DETECTED NOT DETECTED Final   Carbapenem resistance KPC NOT DETECTED NOT DETECTED Final   Carbapenem resistance NDM NOT DETECTED NOT DETECTED Final   Carbapenem resist OXA 48 LIKE NOT DETECTED NOT DETECTED Final   Carbapenem resistance VIM NOT DETECTED NOT DETECTED Final    Comment: Performed at [facility], [address], [place], [place] [address]  Urine culture     Status: Abnormal   Collection Time: 02/21/21  8:30 PM   Specimen: Urine, Random  Result Value  Ref Range Status   Specimen Description   Final    URINE, RANDOM Performed at [facility], [address] [place], [place] [address]    Special Requests   Final    NONE Performed at [facility] [address], [place], [place] [address]    Culture (A)  Final    <10,000 COLONIES/mL INSIGNIFICANT GROWTH Performed at [facility], [address] [place], [place] [address]    Report Status 02/23/2021 FINAL  Final  Blood culture (routine x 2)     Status: Abnormal   Collection Time: 02/21/21  8:39 PM   Specimen: BLOOD  Result Value Ref Range Status   Specimen Description   Final    BLOOD BLOOD RIGHT WRIST Performed at [facility], [address], [place], [place] [address]    Special Requests   Final    BOTTLES DRAWN AEROBIC AND ANAEROBIC Blood Culture adequate volume Performed at [facility], [address] [place], [place] [address]    Culture  Setup Time   Final    GRAM NEGATIVE RODS IN BOTH AEROBIC AND ANAEROBIC BOTTLES CRITICAL VALUE NOTED.  VALUE IS CONSISTENT WITH PREVIOUSLY REPORTED AND CALLED VALUE. Performed at [facility], [address] [place], [place] [address]    Culture (A)  Final    ESCHERICHIA COLI SUSCEPTIBILITIES PERFORMED ON PREVIOUS CULTURE WITHIN THE LAST 5 DAYS. Performed at [facility], [address] [place], [place] [address]    Report Status 02/24/2021 FINAL  Final  Resp Panel by RT-PCR (Flu A&B, Covid) Nasopharyngeal Swab     Status: None   Collection Time: 02/21/21  8:39 PM   Specimen: Nasopharyngeal Swab; Nasopharyngeal(NP) swabs in vial transport medium  Result Value Ref Range Status   SARS Coronavirus 2 by RT PCR NEGATIVE NEGATIVE Final    Comment: (NOTE) SARS-CoV-2 target nucleic acids are NOT DETECTED.  The SARS-CoV-2 RNA is generally detectable in upper respiratory specimens during the acute phase of infection. The lowest concentration of SARS-CoV-2 viral copies this  assay can detect is 138 copies/mL. A negative result does not preclude SARS-Cov-2 infection and should not be used as the sole basis for treatment or other patient management decisions. A negative result may occur  with  improper specimen collection/handling, submission of specimen other than nasopharyngeal swab, presence of viral mutation(s) within the areas targeted by this assay, and inadequate number of viral copies(<138 copies/mL). A negative result must be combined with clinical observations, patient history, and epidemiological information. The expected result is Negative.  Fact Sheet for Patients:  BloggerCourse.com  Fact Sheet for Healthcare Providers:  SeriousBroker.it  This test is no t yet approved or cleared by the [place] FDA and  has been authorized for detection and/or diagnosis of SARS-CoV-2 by FDA under an Emergency Use Authorization (EUA). This EUA will remain  in effect (meaning this test can be used) for the duration of the COVID-19 declaration under Section 564(b)(1) of the Act, 21 U.S.C.section 360bbb-3(b)(1), unless the authorization is terminated  or revoked sooner.       Influenza A by PCR NEGATIVE NEGATIVE Final   Influenza B by PCR NEGATIVE NEGATIVE Final    Comment: (NOTE) The Xpert Xpress SARS-CoV-2/FLU/RSV plus assay is intended as an aid in the diagnosis of influenza from Nasopharyngeal swab specimens and should not be used as a sole basis for treatment. Nasal washings and aspirates are unacceptable for Xpert Xpress SARS-CoV-2/FLU/RSV testing.  Fact Sheet for Patients: BloggerCourse.com  Fact Sheet for Healthcare Providers: SeriousBroker.it  This test is not yet approved or cleared by the [place] FDA and has been authorized for detection and/or diagnosis of SARS-CoV-2 by FDA under an Emergency Use Authorization (EUA). This EUA will  remain in effect (meaning this test can be used) for the duration of the COVID-19 declaration under Section 564(b)(1) of the Act, 21 U.S.C. section 360bbb-3(b)(1), unless the authorization is terminated or revoked.  Performed at [facility], [address], [place], [place] [address]   Surgical pcr screen     Status: Abnormal   Collection Time: 02/23/21  8:25 PM   Specimen: Nasal Mucosa; Nasal Swab  Result Value Ref Range Status   MRSA, PCR NEGATIVE NEGATIVE Final   Staphylococcus aureus POSITIVE (A) NEGATIVE Final    Comment: (NOTE) The Xpert SA Assay (FDA approved for NASAL specimens in patients 80 years of age and older), is one component of a comprehensive surveillance program. It is not intended to diagnose infection nor to guide or monitor treatment. Performed at [facility], [address], [place], [place] [address]          Radiology Studies: MR ABDOMEN MRCP W WO CONTAST  Result Date: 02/23/2021 CLINICAL DATA:  Evaluate for choledocholithiasis. EXAM: MRI ABDOMEN WITHOUT AND WITH CONTRAST (INCLUDING MRCP) TECHNIQUE: Multiplanar multisequence MR imaging of the abdomen was performed both before and after the administration of intravenous contrast. Heavily T2-weighted images of the biliary and pancreatic ducts were obtained, and three-dimensional MRCP images were rendered by post processing. CONTRAST:  6.28mL GADAVIST GADOBUTROL 1 MMOL/ML IV SOLN COMPARISON:  02/21/2021 FINDINGS: Exam detail is diminished due to motion artifact. Lower chest: Small bilateral pleural effusions with overlying atelectasis. Hepatobiliary: Innumerable cysts are identified throughout both lobes of liver compatible with polycystic liver disease. Mild gallbladder wall thickening measures up to 4.6 mm. No gallstones identified. Fusiform dilatation of the common bile duct measures up to 1.4 cm. No signs of choledocholithiasis at this time. Pancreas: Interstitial edema and peripancreatic fat  stranding identified. No main duct dilatation. No signs of pancreatic necrosis or pseudocyst formation. No pancreatic mass identified at this time. Spleen: 3 cm cyst identified. Spleen is otherwise normal in appearance and size. Adrenals/Urinary Tract: Normal appearance of  the adrenal glands. No hydronephrosis identified bilaterally. Stomach/Bowel: Visualized portions within the abdomen are unremarkable. Vascular/Lymphatic: Extensive aortic atherosclerosis. Infrarenal abdominal aortic aneurysm measures 3.1 cm. No abdominal adenopathy. Other: Soft tissue edema extends from the pancreas into the retroperitoneal fat bilaterally. Mild perisplenic and perihepatic ascites. Musculoskeletal: No suspicious bone lesions identified. IMPRESSION: 1. Acute pancreatitis. No signs of pancreatic necrosis or pseudocyst formation. 2. Mild gallbladder wall thickening without gallstones. 3. Mild fusiform dilatation of the common bile duct measuring up to 1.4 cm. No signs of choledocholithiasis at this time. 4. Hepatic and splenic cysts. 5. Small bilateral pleural effusions with overlying atelectasis. 6. 3.1 cm infrarenal abdominal aortic aneurysm. Recommend follow-up ultrasound every 3 years. This recommendation follows ACR consensus guidelines: White Paper of the ACR Incidental Findings Committee II on Vascular Findings. J Am Coll Radiol 2013; 10:789-794. Electronically Signed   By: Signa Kell M.D.   On: 02/23/2021 07:33   ECHOCARDIOGRAM COMPLETE  Result Date: 02/24/2021    ECHOCARDIOGRAM REPORT   Patient Name:   Travis Gallagher [facility] Date of Exam: 02/24/2021 Medical Rec #:  [id number]                 Height:       67.0 in Accession #:    [id number]                Weight:       140.0 lb Date of Birth:  01/31/1931                 BSA:          1.738 m Patient Age:    89 years                  BP:           176/81 mmHg Patient Gender: M                         HR:           89 bpm. Exam Location:  Inpatient Procedure: 2D Echo,  3D Echo, Cardiac Doppler and Color Doppler Indications:     Elevated troponin.; R07.9* Chest pain, unspecified  History:         Patient has no prior history of Echocardiogram examinations.                  Signs/Symptoms:Bacteremia; Risk Factors:Hypertension. Elevated                  troponin.  Sonographer:     Sheralyn Boatman RDCS Referring Phys:  [id number] [facility] Merikay Lesniewski Diagnosing Phys: Charlton Haws MD  Sonographer Comments: Technically difficult study due to poor echo windows. Patient moving throughout study IMPRESSIONS  1. Diffuse hypokinesis abnromal septal motion worse in inferior basal wall. Left ventricular ejection fraction, by estimation, is 45 to 50%. The left ventricle has mildly decreased function. The left ventricle demonstrates global hypokinesis. There is moderate left ventricular hypertrophy. Left ventricular diastolic parameters are consistent with Grade I diastolic dysfunction (impaired relaxation).  2. Right ventricular systolic function is normal. The right ventricular size is normal. There is mildly elevated pulmonary artery systolic pressure.  3. The pericardial effusion is posterior to the left ventricle.  4. The mitral valve is abnormal. Mild to moderate mitral valve regurgitation. No evidence of mitral stenosis.  5. The aortic valve is tricuspid. There is moderate calcification of the aortic valve. There is moderate thickening of the aortic valve.  Aortic valve regurgitation is mild. Mild to moderate aortic valve sclerosis/calcification is present, without any evidence of aortic stenosis.  6. Aortic dilatation noted. There is mild dilatation of the ascending aorta, measuring 40 mm.  7. The inferior vena cava is normal in size with greater than 50% respiratory variability, suggesting right atrial pressure of 3 mmHg. FINDINGS  Left Ventricle: Diffuse hypokinesis abnromal septal motion worse in inferior basal wall. Left ventricular ejection fraction, by estimation, is 45 to 50%. The left ventricle has  mildly decreased function. The left ventricle demonstrates global hypokinesis. The left ventricular internal cavity size was normal in size. There is moderate left ventricular hypertrophy. Left ventricular diastolic parameters are consistent with Grade I diastolic dysfunction (impaired relaxation). Right Ventricle: The right ventricular size is normal. No increase in right ventricular wall thickness. Right ventricular systolic function is normal. There is mildly elevated pulmonary artery systolic pressure. The tricuspid regurgitant velocity is 2.72  m/s, and with an assumed right atrial pressure of 8 mmHg, the estimated right ventricular systolic pressure is 37.6 mmHg. Left Atrium: Left atrial size was normal in size. Right Atrium: Right atrial size was normal in size. Pericardium: Trivial pericardial effusion is present. The pericardial effusion is posterior to the left ventricle. Mitral Valve: The mitral valve is abnormal. There is mild thickening of the mitral valve leaflet(s). There is mild calcification of the mitral valve leaflet(s). Mild to moderate mitral valve regurgitation. No evidence of mitral valve stenosis. Tricuspid Valve: The tricuspid valve is normal in structure. Tricuspid valve regurgitation is not demonstrated. No evidence of tricuspid stenosis. Aortic Valve: The aortic valve is tricuspid. There is moderate calcification of the aortic valve. There is moderate thickening of the aortic valve. Aortic valve regurgitation is mild. Aortic regurgitation PHT measures 406 msec. Mild to moderate aortic valve sclerosis/calcification is present, without any evidence of aortic stenosis. Pulmonic Valve: The pulmonic valve was normal in structure. Pulmonic valve regurgitation is not visualized. No evidence of pulmonic stenosis. Aorta: The aortic root is normal in size and structure and aortic dilatation noted. There is mild dilatation of the ascending aorta, measuring 40 mm. Venous: The inferior vena cava is  normal in size with greater than 50% respiratory variability, suggesting right atrial pressure of 3 mmHg. IAS/Shunts: No atrial level shunt detected by color flow Doppler.  LEFT VENTRICLE PLAX 2D LVIDd:         3.20 cm     Diastology LVIDs:         2.20 cm     LV e' medial:    5.11 cm/s LV PW:         1.30 cm     LV E/e' medial:  16.0 LV IVS:        1.40 cm     LV e' lateral:   7.18 cm/s LVOT diam:     2.00 cm     LV E/e' lateral: 11.4 LV SV:         79 LV SV Index:   45 LVOT Area:     3.14 cm  LV Volumes (MOD) LV vol d, MOD A2C: 80.0 ml LV vol d, MOD A4C: 65.0 ml LV vol s, MOD A2C: 35.3 ml LV vol s, MOD A4C: 37.2 ml LV SV MOD A2C:     44.7 ml LV SV MOD A4C:     65.0 ml LV SV MOD BP:      39.1 ml RIGHT VENTRICLE  IVC RV S prime:     14.30 cm/s  IVC diam: 1.40 cm TAPSE (M-mode): 2.4 cm LEFT ATRIUM             Index       RIGHT ATRIUM           Index LA diam:        2.90 cm 1.67 cm/m  RA Area:     12.40 cm LA Vol (A2C):   55.1 ml 31.71 ml/m RA Volume:   25.30 ml  14.56 ml/m LA Vol (A4C):   35.2 ml 20.26 ml/m LA Biplane Vol: 46.8 ml 26.93 ml/m  AORTIC VALVE LVOT Vmax:   129.00 cm/s LVOT Vmean:  85.100 cm/s LVOT VTI:    0.250 m AI PHT:      406 msec  AORTA Ao Root diam: 3.60 cm Ao Asc diam:  4.00 cm MITRAL VALVE                 TRICUSPID VALVE MV Area (PHT): 5.02 cm      TR Peak grad:   29.6 mmHg MV Decel Time: 151 msec      TR Vmax:        272.00 cm/s MR Peak grad:    122.8 mmHg MR Mean grad:    95.0 mmHg   SHUNTS MR Vmax:         554.00 cm/s Systemic VTI:  0.25 m MR Vmean:        474.0 cm/s  Systemic Diam: 2.00 cm MR PISA:         1.57 cm MR PISA Eff ROA: 11 mm MR PISA Radius:  0.50 cm MV E velocity: 82.00 cm/s MV A velocity: 120.00 cm/s MV E/A ratio:  0.68 Charlton Haws MD Electronically signed by Charlton Haws MD Signature Date/Time: 02/24/2021/1:44:11 PM    Final         Scheduled Meds: . Chlorhexidine Gluconate Cloth  6 each Topical Daily  . mupirocin ointment  1 application Nasal BID   . pantoprazole (PROTONIX) IV  40 mg Intravenous Q24H   Continuous Infusions: . cefTRIAXone (ROCEPHIN)  IV 2 g (02/23/21 2010)  . lactated ringers 1,000 mL (02/23/21 0946)  . metronidazole 500 mg (02/24/21 1536)    Assessment & Plan:   Principal Problem:   Acute pancreatitis Active Problems:   Severe sepsis (HCC)   Cholangitis   Abdominal pain   Nausea   Hypertension   GERD (gastroesophageal reflux disease)   Elevated troponin   #) Acute biliary pancreatitis:  GI Consulted, input was appreciated. Presumed biliary in the setting of cholelithiasis, dilated CBD.  None of the recent imaging confirms actual choledocholelithiasis so may have passed a stone ?cholangitis-on metro and rocephin 4/29-continue IV fluids for gentle hydration, n.p.o. General surgery was consulted input was appreciated plan for lap chole in a.m. Cardiology was consulted for preoperative stratification, has been cleared for surgery LFTs improved    #) Severe sepsis/E. coli bacteremia Likely from above Urine culture insignificant growth Hemodynamically stable Continue on Rocephin will need total 10-day treatment          #) Elevated troponin: Possibly secondary to demand ischemia  EKG no ischemic changes  4/29-Echo with EF of 45 to 50%.  Diffuse hypokinesis with abnormal septal motion worsening inferior basal wall Cardiology following     #PSVT- brief run.  asx With above echo findings consider beta blk post op if bp and HR stable.     #) Essential hypertension:  Not on any antihypertensive medications at home. bp low to nml. Spike of high possibly 2/2 pain Monitor        #) GERD: Continue IV PPI  i      DVT prophylaxis: SCD Code Status: Full Family Communication: Family at bedside  Status is: Inpatient  Remains inpatient appropriate because:Inpatient level of care appropriate due to severity of illness   Dispo: The patient is from: Home               Anticipated d/c is to: Home              Patient currently is not medically stable to d/c.   Difficult to place patient No            LOS: 2 days   Time spent: 45 minutes with more than 50% on COC    Lynn ItoSahar Marybeth Dandy, MD [facility] Pager [phone]  If 7PM-7AM, please contact night-coverage 02/24/2021, 5:19 PM

## 2021-02-24 NOTE — Consult Note (Addendum)
Cardiology Consultation:   Patient ID: Travis Gallagher MRN: [id number]; DOB: Sep 09, 1931  Admit date: 02/22/2021 Date of Consult: 02/24/2021  PCP:  [facility]  Cardiologist:  New Advanced Practice Provider:  No care team member to display Electrophysiologist:  None  [id number]}   Patient Profile:   Travis Gallagher is a 87 y.o. male with a hx of HTN and GERD who is being seen today for the evaluation of pre-op cardiac evaluation at the request of Dr. Marylu Lund.  History of Present Illness:   Mr. Travis Gallagher has no significant cardiac history. He is visiting from [place] and will be here until June 2022. In [place] he was given enalapril for blood pressure, but rarely takes it. Denies history of MI, stent, CHF, diabetes, HLD. No h/o tobacco use, alcohol, or drug use.   The patient was admitted to [facility] from [facility] for acute pancreatitis in the setting of potential choledocholithiasis.   The patient presented to [facility] 4/25 for abdominal pain. CT abd/pelvis showed mild dilation of the common bile duct to 86mm, no no evidence of acute process. He was managed conservatively and sent home.   He presented back to [facility] 4/26 for worsening abdominal pain. CMP showed transaminitis and hyperbilirubinemia. Lipase was 3000. CBC showed leukocytosis. HS trop 29>32. Lactic 2.4>1.1. BC negative x 2. CT abd/pelvis showed acute pancreatitis with stable dilation of the CBP. He was transferred to [facility] for further management and  ERCP.   Patient was seen by surgery. Labs trending down. Considering cholecystectomy. Cardiology consulted for elevated troponin.   Past Medical History:  Diagnosis Date   GERD (gastroesophageal reflux disease)    Hypertension     History reviewed. No pertinent surgical history.   Home Medications:  Prior to Admission medications   Medication Sig Start Date End Date Taking? Authorizing Provider  famotidine (PEPCID) 20 MG  tablet Take 1 tablet (20 mg total) by mouth daily. 02/20/21 02/20/22 Yes Phineas Semen, MD  sucralfate (CARAFATE) 1 g tablet Take 1 tablet (1 g total) by mouth 4 (four) times daily. 02/20/21  Yes Phineas Semen, MD    Inpatient Medications: Scheduled Meds:  Chlorhexidine Gluconate Cloth  6 each Topical Daily   mupirocin ointment  1 application Nasal BID   pantoprazole (PROTONIX) IV  40 mg Intravenous Q24H   potassium chloride  40 mEq Oral Once   Continuous Infusions:  cefTRIAXone (ROCEPHIN)  IV 2 g (02/23/21 2010)   lactated ringers 1,000 mL (02/23/21 0946)   metronidazole 500 mg (02/24/21 0512)   potassium chloride     PRN Meds: acetaminophen **OR** acetaminophen, fentaNYL (SUBLIMAZE) injection, ondansetron (ZOFRAN) IV  Allergies:   No Known Allergies  Social History:   Social History   Socioeconomic History   Marital status: Married    Spouse name: Not on file   Number of children: Not on file   Years of education: Not on file   Highest education level: Not on file  Occupational History   Not on file  Tobacco Use   Smoking status: Never Smoker   Smokeless tobacco: Never Used  Substance and Sexual Activity   Alcohol use: Not Currently   Drug use: Not Currently   Sexual activity: Yes  Other Topics Concern   Not on file  Social History Narrative   Not on file   Social Determinants of Health   Financial Resource Strain: Not on file  Food Insecurity: Not on file  Transportation Needs:  Not on file  Physical Activity: Not on file  Stress: Not on file  Social Connections: Not on file  Intimate Partner Violence: Not on file    Family History:   History reviewed. No pertinent family history.   ROS:  Please see the history of present illness.   All other ROS reviewed and negative.     Physical Exam/Data:   Vitals:   02/23/21 1358 02/23/21 1954 02/23/21 2101 02/24/21 0951  BP: (!) 162/72 (!) 186/84 (!) 160/74 (!) 176/81  Pulse: 82 100 92 84  Resp:   18    Temp: 97.7 F (36.5 C) 99.9 F (37.7 C) 98.7 F (37.1 C)   TempSrc: Oral Oral    SpO2: 96% 91% 96% 94%    Intake/Output Summary (Last 24 hours) at 02/24/2021 1111 Last data filed at 02/24/2021 0738 Gross per 24 hour  Intake 75 ml  Output 450 ml  Net -375 ml   Last 3 Weights 02/21/2021 02/20/2021  Weight (lbs) 140 lb 140 lb  Weight (kg) 63.504 kg 63.504 kg     There is no height or weight on file to calculate BMI.  General:  Well nourished, well developed, in no acute distress HEENT: normal Lymph: no adenopathy Neck: no JVD Endocrine:  No thryomegaly Vascular: No carotid bruits; FA pulses 2+ bilaterally without bruits  Cardiac:  normal S1, S2; RRR; no murmur  Lungs:  clear to auscultation bilaterally, no wheezing, rhonchi or rales  Abd: mild tenderness, no hepatomegaly  Ext: no edema Musculoskeletal:  No deformities, BUE and BLE strength normal and equal Skin: warm and dry  Neuro:  CNs 2-12 intact, no focal abnormalities noted Psych:  Normal affect   EKG:  The EKG was personally reviewed and demonstrates:   Telemetry:  Telemetry was personally reviewed and demonstrates:  NSR HR 70-80s, brief SVT rates in the 130s,   Relevant CV Studies:  Echo ordered  Laboratory Data:  High Sensitivity Troponin:   Recent Labs  Lab 02/21/21 2021 02/21/21 2141 02/22/21 1449 02/22/21 1959  TROPONINIHS 29* 32* 174* 112*     Chemistry Recent Labs  Lab 02/22/21 1245 02/23/21 0126 02/24/21 0455  NA 138 138 136  K 3.9 3.6 3.2*  CL 107 106 106  CO2 24 20* 23  GLUCOSE 90 75 104*  BUN CREATININE 1.25* 1.27* 1.07  CALCIUM 8.0* 8.2* 8.1*  GFRNONAA 56* 55* >60  ANIONGAP Recent Labs  Lab 02/22/21 1245 02/23/21 0126 02/24/21 0455  PROT 6.1* 6.2* 5.5*  ALBUMIN 2.8* 2.6* 2.1*  AST 102* 66* 30  ALT 125* 95* 56*  ALKPHOS 178* 169* 137*  BILITOT 4.7* 2.6* 1.2   Hematology Recent Labs  Lab 02/22/21 1245 02/23/21 0126 02/24/21 0455  WBC 9.4 7.6 9.5   RBC 4.26 4.28 3.99*  HGB 12.7* 12.7* 12.1*  HCT 38.0* 38.1* 34.5*  MCV 89.2 89.0 86.5  MCH 29.8 29.7 30.3  MCHC 33.4 33.3 35.1  RDW 12.9 13.0 12.7  PLT 95* 89* 80*   BNPNo results for input(s): BNP, PROBNP in the last 168 hours.  DDimer No results for input(s): DDIMER in the last 168 hours.   Radiology/Studies:  DG Chest 2 View  Result Date: 02/21/2021 CLINICAL DATA:  Worsening abdominal pain radiating into the chest. EXAM: CHEST - 2 VIEW COMPARISON:  CT abdomen pelvis February 20, 2021 FINDINGS: The heart size and mediastinal contours are within normal limits. Low lung volumes. Streaky left basilar  opacities. No visible pleural effusion or pneumothorax. Thoracic spondylosis. Degenerative changes bilateral shoulders. IMPRESSION: Low lung volumes with streaky left basilar opacities, which may represent atelectasis or infiltrate. Electronically Signed   By: Maudry Mayhew MD   On: 02/21/2021 20:44   CT Angio Chest PE W and/or Wo Contrast  Result Date: 02/21/2021 CLINICAL DATA:  Worsening abdominal pain radiating to the chest. EXAM: CT ANGIOGRAPHY CHEST WITH CONTRAST TECHNIQUE: Multidetector CT imaging of the chest was performed using the standard protocol during bolus administration of intravenous contrast. Multiplanar CT image reconstructions and MIPs were obtained to evaluate the vascular anatomy. CONTRAST:  54mL OMNIPAQUE IOHEXOL 350 MG/ML SOLN COMPARISON:  CT abdomen pelvis February 20, 2021. FINDINGS: Cardiovascular: Satisfactory opacification of the pulmonary arteries to the segmental level. No evidence of pulmonary embolism. Aortic atherosclerosis. The ascending aorta measures 3.4 cm in maximal diameter. There are few areas of focal enlargement involving the thoracic aorta 1 in the aortic arch measures 3.6 cm in maximal diameter and another in the descending aorta measures 3.6 cm in maximal diameter. The heart size. No pericardial effusion. Mediastinum/Nodes: No enlarged mediastinal, hilar, or  axillary lymph nodes. Thyroid gland, trachea, and esophagus demonstrate no significant findings. Lungs/Pleura: Bibasilar atelectasis. Left upper lobe calcified granuloma. Mild emphysematous change. No pleural effusion. No pneumothorax. Upper Abdomen: Polycystic liver disease. Distended gallbladder without signs gallbladder inflammation. 2.9 cm cyst in the spleen. Musculoskeletal: Multilevel degenerative changes spine. No acute osseous abnormality. Review of the MIP images confirms the above findings. IMPRESSION: 1. No evidence of pulmonary embolism. 2. Focal areas of aortic ectasia involving the thoracic aorta, involving the aortic arch and descending aorta both of which measure 3.6 cm in maximal diameter. Recommend annual imaging followup by CTA or MRA. This recommendation follows 2010 ACCF/AHA/AATS/ACR/ASA/SCA/SCAI/SIR/STS/SVM Guidelines for the Diagnosis and Management of Patients with Thoracic Aortic Disease. Circulation.2010; 121: P379-K240. Aortic aneurysm NOS (ICD10-I71.9) 3. Polycystic liver disease. 4. Distended gallbladder without signs gallbladder inflammation. 5. Emphysema and aortic atherosclerosis. Aortic Atherosclerosis (ICD10-I70.0) and Emphysema (ICD10-J43.9). Electronically Signed   By: Maudry Mayhew MD   On: 02/21/2021 23:43   CT ABDOMEN PELVIS W CONTRAST  Result Date: 02/21/2021 CLINICAL DATA:  Persistent abdominal pain EXAM: CT ABDOMEN AND PELVIS WITH CONTRAST TECHNIQUE: Multidetector CT imaging of the abdomen and pelvis was performed using the standard protocol following bolus administration of intravenous contrast. CONTRAST:  21mL OMNIPAQUE IOHEXOL 350 MG/ML SOLN COMPARISON:  February 20, 2021 and same-day ultrasound FINDINGS: Lower chest: Bibasilar atelectasis. Hepatobiliary: Numerous hepatic cysts consistent with polycystic liver disease. Gallbladder is distended without findings of acute inflammation. Similar dilation of the common duct measuring up to 10 mm Pancreas: Peripancreatic  stranding. No pancreatic ductal dilation. No walled off collection. Spleen: 2. 9 cm cyst in the spleen Adrenals/Urinary Tract: Adrenal glands are unremarkable. Kidneys are normal, without renal calculi, focal lesion, or hydronephrosis. Bladder is unremarkable. Stomach/Bowel: Similar sub mucosal edema in the proximal stomach. No pathologically dilated loops of small bowel. Normal appendix. No suspicious colonic wall thickening or mass like lesions. Vascular/Lymphatic: The portal vein, splenic vein and SMV are patent. Aortic atherosclerosis. Infrarenal abdominal aortic aneurysm measuring 3 cm on image 40/2. 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 No pathologically enlarged abdominal or pelvic lymph nodes. Reproductive: Mild prostatic enlargement. Other: Peripancreatic fluid. No abdominal ascites or walled off fluid collections. No pneumoperitoneum. Musculoskeletal: Multilevel degenerative changes spine. No acute osseous abnormality. IMPRESSION: 1. Peripancreatic stranding most consistent with acute interstitial pancreatitis, recommend correlation with serum  lipase. No walled off fluid collections or evidence of pancreatic necrosis. 2. Similar submucosal edema in the proximal stomach suggestive of gastritis. 3. Stable dilation of the common duct measuring up to 10 mm, favored to represent benign senescent dilation. 4. 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 5. Infrarenal abdominal aortic aneurysm measuring 3 cm. Recommend follow-up ultrasound every 3 years. This recommendation follows ACR consensus guidelines: White Paper of the ACR Incidental Findings Committee II on Vascular Findings. J Am Coll Radiol 2013; 10:789-794. 6. Aortic atherosclerosis.  Aortic Atherosclerosis (ICD10-I70.0). Electronically Signed   By: Maudry Mayhew MD   On: 02/21/2021 23:53   CT ABDOMEN PELVIS W CONTRAST  Result Date: 02/20/2021 CLINICAL DATA:  RIGHT-sided abdominal pain  EXAM: CT ABDOMEN AND PELVIS WITH CONTRAST TECHNIQUE: Multidetector CT imaging of the abdomen and pelvis was performed using the standard protocol following bolus administration of intravenous contrast. CONTRAST:  OMNIPAQUE IOHEXOL 300 MG/ML  SOLN COMPARISON:  None. FINDINGS: Lower chest: Lung bases are clear. Hepatobiliary: Multiple round simple fluid cysts of varying size throughout the liver parenchyma. No biliary duct dilatation. Common bile duct is mildly dilated 9 mm. No obstructing lesion. Pancreas: Pancreas is normal. No ductal dilatation. No pancreatic inflammation. Spleen: Cyst within the medial aspect of the spleen measures 2.7 cm. Adrenals/urinary tract: Adrenal glands and kidneys are normal. The ureters and bladder normal. Stomach/Bowel: Mild submucosal thickening in the gastric fundus and cardia (image 27/series 2 and image 74/series 6). The gastric body and antrum appear normal. Small bowel is normal. No evidence of bowel obstruction or inflammation. Appendix normal. The colon and rectosigmoid colon are normal. Vascular/Lymphatic: Calcification abdominal aorta. 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 No lymphadenopathy Reproductive: . prostate unremarkable. Other: No free fluid. Musculoskeletal: No aggressive osseous lesion. IMPRESSION: 1. Submucosal edema in the proximal stomach. Findings suggest gastritis. 2. Multiple benign cysts within liver. 3. Mild dilatation of common bile duct is favored benign senescent dilatation. Electronically Signed   By: Genevive Bi M.D.   On: 02/20/2021 19:57   MR ABDOMEN MRCP W WO CONTAST  Result Date: 02/23/2021 CLINICAL DATA:  Evaluate for choledocholithiasis. EXAM: MRI ABDOMEN WITHOUT AND WITH CONTRAST (INCLUDING MRCP) TECHNIQUE: Multiplanar multisequence MR imaging of the abdomen was performed both before and after the administration of intravenous contrast. Heavily T2-weighted images of the biliary and pancreatic ducts were  obtained, and three-dimensional MRCP images were rendered by post processing. CONTRAST:  6.43mL GADAVIST GADOBUTROL 1 MMOL/ML IV SOLN COMPARISON:  02/21/2021 FINDINGS: Exam detail is diminished due to motion artifact. Lower chest: Small bilateral pleural effusions with overlying atelectasis. Hepatobiliary: Innumerable cysts are identified throughout both lobes of liver compatible with polycystic liver disease. Mild gallbladder wall thickening measures up to 4.6 mm. No gallstones identified. Fusiform dilatation of the common bile duct measures up to 1.4 cm. No signs of choledocholithiasis at this time. Pancreas: Interstitial edema and peripancreatic fat stranding identified. No main duct dilatation. No signs of pancreatic necrosis or pseudocyst formation. No pancreatic mass identified at this time. Spleen: 3 cm cyst identified. Spleen is otherwise normal in appearance and size. Adrenals/Urinary Tract: Normal appearance of the adrenal glands. No hydronephrosis identified bilaterally. Stomach/Bowel: Visualized portions within the abdomen are unremarkable. Vascular/Lymphatic: Extensive aortic atherosclerosis. Infrarenal abdominal aortic aneurysm measures 3.1 cm. No abdominal adenopathy. Other: Soft tissue edema extends from the pancreas into the retroperitoneal fat bilaterally. Mild perisplenic and perihepatic ascites. Musculoskeletal: No suspicious bone lesions identified. IMPRESSION: 1. Acute pancreatitis.  No signs of pancreatic necrosis or pseudocyst formation. 2. Mild gallbladder wall thickening without gallstones. 3. Mild fusiform dilatation of the common bile duct measuring up to 1.4 cm. No signs of choledocholithiasis at this time. 4. Hepatic and splenic cysts. 5. Small bilateral pleural effusions with overlying atelectasis. 6. 3.1 cm infrarenal abdominal aortic aneurysm. Recommend follow-up ultrasound every 3 years. This recommendation follows ACR consensus guidelines: White Paper of the ACR Incidental Findings  Committee II on Vascular Findings. J Am Coll Radiol 2013; 10:789-794. Electronically Signed   By: Signa Kellaylor  Stroud M.D.   On: 02/23/2021 07:33   US ABDOMEN LIMITED RUQ (LIVER/GB)  Result Date: 02/21/2021 CLINICAL DATA:  Epigastric pain since yesterday. EXAM: ULTRASOUND ABDOMEN LIMITED RIGHT UPPER QUADRANT COMPARISON:  CT abdomen and pelvis 02/20/2021 FINDINGS: Gallbladder: Cholelithiasis with several small stones demonstrated in the gallbladder. No gallbladder wall thickening or edema. Murphy's sign is normal. Common bile duct: Diameter: 11 mm, dilated.  Similar to prior CT. Liver: Numerous cysts throughout the liver consistent with polycystic liver disease. No solid masses identified. Portal vein is patent on color Doppler imaging with normal direction of blood flow towards the liver. Other: None. IMPRESSION: Cholelithiasis without evidence of acute cholecystitis. Polycystic liver disease. Moderate extrahepatic bile duct dilatation, cause not determined. Electronically Signed   By: Burman NievesWilliam  Stevens M.D.   On: 02/21/2021 23:07     Assessment and Plan:   Pre-op cardiac evaluation for possible cholecystectomy - Admitted for acute pancreatitis and sepsis found to have minimally elevated troponin. Possible surgery today - Patient has no known significant cardiac history. He was on medication for HTN, but does not take this - Patient denies any h/o anginal symptoms. No chest pain or SOB. No LLE, orthopnea, pnd - CT chest showed areas of focal ectasia and aortic atherosclerosis - EKG today shows SR 75bpm, TW changes inferolateral leads, which are new compared to EKG 4/25. EKG from 4/26 shows RBBB with some T wave change as well, - Tele showed brief run of SVT, can consider BB post-op +/- heart monitor - Echo ordered - Hs troponin elevated in the setting of acute illness, suspect mostly demand ischemia - METS>4. Patient is functional at baseline and can perform ADL's, climb stairs, and walk 1-2 blocks -  According to the revised cardiac index he is Class I risk, 3.9% with a 30 day risk of death, MI, or cardiac arrest. If Echo is unremarkable, likely OK to proceed with surgery.   For questions or updates, please contact [facility] Please consult [email or web address] for contact info under    Signed, Cadence David StallH Furth, PA-C  02/24/2021 11:11 AM  Patient examined chart reviewed. Discussed care with family (some language barrier) and PA Exam with elderly male Lungs clear soft SEM abdomen tender but No rebound no edema. ECG with transient RBBB When not in RBBB T wave changes 3,F. No history of CAD, chest pain and can do 4 METS activity TTE Has been ordered. Ok to proceed to OR for lap choly if EF normal Mild elevation in troponin does not seem cardiac with lack of history and no chest pain Documenting Normal EF with no RWMAls will be reassuring   Charlton HawsPeter Melrose Kearse MD [facility]

## 2021-02-24 NOTE — Progress Notes (Signed)
Echo shows EF 45-50% Mild to moderate MR  He is not volume overloaded No chest pain  Risk of sepsis and ongoing pancreatitis I think risk of lap choly is acceptable  Charlton Haws MD [facility]

## 2021-02-24 NOTE — Progress Notes (Signed)
Telemetry reported patient had a 8 beat run of SVT. Patient remained asymptomatic and vital signs WNL. Notified Dr. Marylu Lund, orders placed to replace potassium. Will continue to monitor.

## 2021-02-24 NOTE — Progress Notes (Signed)
  Echocardiogram 2D Echocardiogram has been performed.  Travis Gallagher 02/24/2021, 1:09 PM

## 2021-02-25 LAB — LIPASE, BLOOD: Lipase: 92 U/L — ABNORMAL HIGH (ref 11–51)

## 2021-02-25 LAB — POTASSIUM: Potassium: 3.6 mmol/L (ref 3.5–5.1)

## 2021-02-25 MED ORDER — METOPROLOL TARTRATE 5 MG/5ML IV SOLN
2.5000 mg | Freq: Once | INTRAVENOUS | Status: AC
  Administered 2021-02-25: 2.5 mg via INTRAVENOUS
  Filled 2021-02-25: qty 5

## 2021-02-25 NOTE — Progress Notes (Addendum)
PROGRESS NOTE    Travis Gallagher  CHE:[id number] DOB: 1931-04-17 DOA: 02/22/2021 PCP: Oneita Hurt, No    Brief Narrative:  Travis Gallagher is a 87 y.o. male with medical history significant for hypertension, GERD who is admitted to [facility] by way of transfer from [facility] ED for further evaluation and management of acute pancreatitis in setting of potential choledocholithiasis after presenting from home to [facility] ED complaining of abdominal pain  4/29-still with epigastric pain. Tele 8 beats svt 4/30 with mild epigastric pain.  bp overnight elevated. Given metoprolol iv x1 Plan for surgery today   Consultants:   GI, cardiology, general surgery  Procedures:   Antimicrobials:   Ceftriaxone and metronidazole   Subjective: As above.  Objective: Vitals:   02/24/21 1337 02/24/21 2141 02/25/21 0510 02/25/21 0541  BP: (!) 179/75 (!) 162/80 (!) 182/83 (!) 180/85  Pulse: 96 86 94   Resp: (!) 24 20    Temp: 97.9 F (36.6 C) 98.3 F (36.8 C) 98.3 F (36.8 C)   TempSrc: Oral Oral Oral   SpO2: 93% 94% 94%   Weight:    64 kg    Intake/Output Summary (Last 24 hours) at 02/25/2021 1433 Last data filed at 02/25/2021 1233 Gross per 24 hour  Intake 1300 ml  Output 1325 ml  Net -25 ml   Filed Weights   02/25/21 0541  Weight: 64 kg    Examination: Calm, cooperative cta no w/r/r rrr S1-S2 no gallops Soft mildly tender epigastric positive bowel sounds No edema Awake and alert, grossly intact Mood and affect appropriate in current setting     Data Reviewed: I have personally reviewed following labs and imaging studies  CBC: Recent Labs  Lab 02/20/21 1803 02/21/21 2021 02/22/21 1245 02/23/21 0126 02/24/21 0455  WBC 6.4 14.7* 9.4 7.6 9.5  NEUTROABS 3.9 13.4* 8.4* 6.6  --   HGB 13.2 14.4 12.7* 12.7* 12.1*  HCT 37.9* 41.4 38.0* 38.1* 34.5*  MCV 85.6 85.4 89.2 89.0 86.5  PLT 144* 136* 95* 89* 80*   Basic Metabolic Panel: Recent Labs  Lab  02/20/21 1803 02/21/21 2021 02/22/21 1245 02/23/21 0126 02/24/21 0455 02/25/21 0052  NA 138 137 138 138 136  --   K 3.6 3.7 3.9 3.6 3.2* 3.6  CL 103 105 107 106 106  --   CO2 24 19* 24 20* 23  --   GLUCOSE 119* 136* 90 75 104*  --   BUN 21 21 18 23 18   --   CREATININE 1.17 1.05 1.25* 1.27* 1.07  --   CALCIUM 8.6* 8.6* 8.0* 8.2* 8.1*  --   MG  --   --  1.9 2.0  --   --    GFR: Estimated Creatinine Clearance: 44 mL/min (by C-G formula based on SCr of 1.07 mg/dL). Liver Function Tests: Recent Labs  Lab 02/20/21 1803 02/21/21 2021 02/22/21 1245 02/23/21 0126 02/24/21 0455  AST 46* 210* 102* 66* 30  ALT 23 185* 125* 95* 56*  ALKPHOS 106 209* 178* 169* 137*  BILITOT 0.9 6.3* 4.7* 2.6* 1.2  PROT 7.3 7.7 6.1* 6.2* 5.5*  ALBUMIN 3.5 3.5 2.8* 2.6* 2.1*   Recent Labs  Lab 02/20/21 1803 02/21/21 2021 02/23/21 0126 02/25/21 0052  LIPASE 80* 2,952* 382* 92*   No results for input(s): AMMONIA in the last 168 hours. Coagulation Profile: No results for input(s): INR, PROTIME in the last 168 hours. Cardiac Enzymes: No results for input(s): CKTOTAL, CKMB, CKMBINDEX, TROPONINI in the last  168 hours. BNP (last 3 results) No results for input(s): PROBNP in the last 8760 hours. HbA1C: No results for input(s): HGBA1C in the last 72 hours. CBG: No results for input(s): GLUCAP in the last 168 hours. Lipid Profile: Recent Labs    02/22/21 1449  TRIG 35   Thyroid Function Tests: No results for input(s): TSH, T4TOTAL, FREET4, T3FREE, THYROIDAB in the last 72 hours. Anemia Panel: No results for input(s): VITAMINB12, FOLATE, FERRITIN, TIBC, IRON, RETICCTPCT in the last 72 hours. Sepsis Labs: Recent Labs  Lab 02/21/21 2021 02/21/21 2207  PROCALCITON 0.96  --   LATICACIDVEN 2.4* 1.1    Recent Results (from the past 240 hour(s))  Blood culture (routine x 2)     Status: Abnormal   Collection Time: 02/21/21  8:21 PM   Specimen: BLOOD  Result Value Ref Range Status   Specimen  Description   Final    BLOOD LEFT ANTECUBITAL Performed at [facility], [address] [place], [place] [address]    Special Requests   Final    BOTTLES DRAWN AEROBIC AND ANAEROBIC Blood Culture adequate volume Performed at [facility], [address] [place], [place] [address]    Culture  Setup Time   Final    GRAM NEGATIVE RODS IN BOTH AEROBIC AND ANAEROBIC BOTTLES CRITICAL RESULT CALLED TO, READ BACK BY AND VERIFIED WITH: AMY THOMPSON AT 0840 02/22/21 SDR Performed at [facility], [address], [place], [place] [address]    Culture ESCHERICHIA COLI (A)  Final   Report Status 02/24/2021 FINAL  Final   Organism ID, Bacteria ESCHERICHIA COLI  Final      Susceptibility   Escherichia coli - MIC*    AMPICILLIN 8 SENSITIVE Sensitive     CEFAZOLIN <=4 SENSITIVE Sensitive     CEFEPIME <=0.12 SENSITIVE Sensitive     CEFTAZIDIME <=1 SENSITIVE Sensitive     CEFTRIAXONE <=0.25 SENSITIVE Sensitive     CIPROFLOXACIN <=0.25 SENSITIVE Sensitive     GENTAMICIN <=1 SENSITIVE Sensitive     IMIPENEM <=0.25 SENSITIVE Sensitive     TRIMETH/SULFA <=20 SENSITIVE Sensitive     AMPICILLIN/SULBACTAM 4 SENSITIVE Sensitive     PIP/TAZO <=4 SENSITIVE Sensitive     * ESCHERICHIA COLI  Blood Culture ID Panel (Reflexed)     Status: Abnormal   Collection Time: 02/21/21  8:21 PM  Result Value Ref Range Status   Enterococcus faecalis NOT DETECTED NOT DETECTED Final   Enterococcus Faecium NOT DETECTED NOT DETECTED Final   Listeria monocytogenes NOT DETECTED NOT DETECTED Final   Staphylococcus species NOT DETECTED NOT DETECTED Final   Staphylococcus aureus (BCID) NOT DETECTED NOT DETECTED Final   Staphylococcus epidermidis NOT DETECTED NOT DETECTED Final   Staphylococcus lugdunensis NOT DETECTED NOT DETECTED Final   Streptococcus species NOT DETECTED NOT DETECTED Final   Streptococcus agalactiae NOT DETECTED NOT DETECTED Final   Streptococcus pneumoniae NOT DETECTED NOT  DETECTED Final   Streptococcus pyogenes NOT DETECTED NOT DETECTED Final   A.calcoaceticus-baumannii NOT DETECTED NOT DETECTED Final   Bacteroides fragilis NOT DETECTED NOT DETECTED Final   Enterobacterales DETECTED (A) NOT DETECTED Final    Comment: Enterobacterales represent a large order of gram negative bacteria, not a single organism. CRITICAL RESULT CALLED TO, READ BACK BY AND VERIFIED WITH:  AMY THOMPSON AT 0840 02/22/21 SDR    Enterobacter cloacae complex NOT DETECTED NOT DETECTED Final   Escherichia coli DETECTED (A) NOT DETECTED Final    Comment: CRITICAL RESULT CALLED  TO, READ BACK BY AND VERIFIED WITH:  AMY THOMPSON AT 0840 02/22/21 SDR    Klebsiella aerogenes NOT DETECTED NOT DETECTED Final   Klebsiella oxytoca NOT DETECTED NOT DETECTED Final   Klebsiella pneumoniae NOT DETECTED NOT DETECTED Final   Proteus species NOT DETECTED NOT DETECTED Final   Salmonella species NOT DETECTED NOT DETECTED Final   Serratia marcescens NOT DETECTED NOT DETECTED Final   Haemophilus influenzae NOT DETECTED NOT DETECTED Final   Neisseria meningitidis NOT DETECTED NOT DETECTED Final   Pseudomonas aeruginosa NOT DETECTED NOT DETECTED Final   Stenotrophomonas maltophilia NOT DETECTED NOT DETECTED Final   Candida albicans NOT DETECTED NOT DETECTED Final   Candida auris NOT DETECTED NOT DETECTED Final   Candida glabrata NOT DETECTED NOT DETECTED Final   Candida krusei NOT DETECTED NOT DETECTED Final   Candida parapsilosis NOT DETECTED NOT DETECTED Final   Candida tropicalis NOT DETECTED NOT DETECTED Final   Cryptococcus neoformans/gattii NOT DETECTED NOT DETECTED Final   CTX-M ESBL NOT DETECTED NOT DETECTED Final   Carbapenem resistance IMP NOT DETECTED NOT DETECTED Final   Carbapenem resistance KPC NOT DETECTED NOT DETECTED Final   Carbapenem resistance NDM NOT DETECTED NOT DETECTED Final   Carbapenem resist OXA 48 LIKE NOT DETECTED NOT DETECTED Final   Carbapenem resistance VIM NOT DETECTED  NOT DETECTED Final    Comment: Performed at [facility], [address], [place], [place] [address]  Urine culture     Status: Abnormal   Collection Time: 02/21/21  8:30 PM   Specimen: Urine, Random  Result Value Ref Range Status   Specimen Description   Final    URINE, RANDOM Performed at [facility], [address], [place], [place] [address]    Special Requests   Final    NONE Performed at [facility], [address] [place], [place] [address]    Culture (A)  Final    <10,000 COLONIES/mL INSIGNIFICANT GROWTH Performed at [facility], [address], [place], [place] [address]    Report Status 02/23/2021 FINAL  Final  Blood culture (routine x 2)     Status: Abnormal   Collection Time: 02/21/21  8:39 PM   Specimen: BLOOD  Result Value Ref Range Status   Specimen Description   Final    BLOOD BLOOD RIGHT WRIST Performed at [facility], [address] [place], [place] [address]    Special Requests   Final    BOTTLES DRAWN AEROBIC AND ANAEROBIC Blood Culture adequate volume Performed at [facility], [address], [place], [place] [address]    Culture  Setup Time   Final    GRAM NEGATIVE RODS IN BOTH AEROBIC AND ANAEROBIC BOTTLES CRITICAL VALUE NOTED.  VALUE IS CONSISTENT WITH PREVIOUSLY REPORTED AND CALLED VALUE. Performed at [facility], [address] [place], [place] [address]    Culture (A)  Final    ESCHERICHIA COLI SUSCEPTIBILITIES PERFORMED ON PREVIOUS CULTURE WITHIN THE LAST 5 DAYS. Performed at [facility], [address], [place], [place] [address]    Report Status 02/24/2021 FINAL  Final  Resp Panel by RT-PCR (Flu A&B, Covid) Nasopharyngeal Swab     Status: None   Collection Time: 02/21/21  8:39 PM   Specimen: Nasopharyngeal Swab; Nasopharyngeal(NP) swabs in vial transport medium  Result Value Ref Range Status   SARS Coronavirus 2 by RT PCR NEGATIVE NEGATIVE Final    Comment:  (NOTE) SARS-CoV-2 target nucleic acids are  NOT DETECTED.  The SARS-CoV-2 RNA is generally detectable in upper respiratory specimens during the acute phase of infection. The lowest concentration of SARS-CoV-2 viral copies this assay can detect is 138 copies/mL. A negative result does not preclude SARS-Cov-2 infection and should not be used as the sole basis for treatment or other patient management decisions. A negative result may occur with  improper specimen collection/handling, submission of specimen other than nasopharyngeal swab, presence of viral mutation(s) within the areas targeted by this assay, and inadequate number of viral copies(<138 copies/mL). A negative result must be combined with clinical observations, patient history, and epidemiological information. The expected result is Negative.  Fact Sheet for Patients:  BloggerCourse.com  Fact Sheet for Healthcare Providers:  SeriousBroker.it  This test is no t yet approved or cleared by the [place] FDA and  has been authorized for detection and/or diagnosis of SARS-CoV-2 by FDA under an Emergency Use Authorization (EUA). This EUA will remain  in effect (meaning this test can be used) for the duration of the COVID-19 declaration under Section 564(b)(1) of the Act, 21 U.S.C.section 360bbb-3(b)(1), unless the authorization is terminated  or revoked sooner.       Influenza A by PCR NEGATIVE NEGATIVE Final   Influenza B by PCR NEGATIVE NEGATIVE Final    Comment: (NOTE) The Xpert Xpress SARS-CoV-2/FLU/RSV plus assay is intended as an aid in the diagnosis of influenza from Nasopharyngeal swab specimens and should not be used as a sole basis for treatment. Nasal washings and aspirates are unacceptable for Xpert Xpress SARS-CoV-2/FLU/RSV testing.  Fact Sheet for Patients: BloggerCourse.com  Fact Sheet for Healthcare  Providers: SeriousBroker.it  This test is not yet approved or cleared by the [place] FDA and has been authorized for detection and/or diagnosis of SARS-CoV-2 by FDA under an Emergency Use Authorization (EUA). This EUA will remain in effect (meaning this test can be used) for the duration of the COVID-19 declaration under Section 564(b)(1) of the Act, 21 U.S.C. section 360bbb-3(b)(1), unless the authorization is terminated or revoked.  Performed at [facility], [address], [place], [place] [address]   Surgical pcr screen     Status: Abnormal   Collection Time: 02/23/21  8:25 PM   Specimen: Nasal Mucosa; Nasal Swab  Result Value Ref Range Status   MRSA, PCR NEGATIVE NEGATIVE Final   Staphylococcus aureus POSITIVE (A) NEGATIVE Final    Comment: (NOTE) The Xpert SA Assay (FDA approved for NASAL specimens in patients 46 years of age and older), is one component of a comprehensive surveillance program. It is not intended to diagnose infection nor to guide or monitor treatment. Performed at [facility], [address] [place], [place] [address]          Radiology Studies: ECHOCARDIOGRAM COMPLETE  Result Date: 02/24/2021    ECHOCARDIOGRAM REPORT   Patient Name:   CASSIAN TORELLI [facility] Date of Exam: 02/24/2021 Medical Rec #:  [id number]                 Height:       67.0 in Accession #:    [id number]                Weight:       140.0 lb Date of Birth:  10-Jun-1931                 BSA:          1.738 m Patient Age:    72 years  BP:           176/81 mmHg Patient Gender: M                         HR:           89 bpm. Exam Location:  Inpatient Procedure: 2D Echo, 3D Echo, Cardiac Doppler and Color Doppler Indications:     Elevated troponin.; R07.9* Chest pain, unspecified  History:         Patient has no prior history of Echocardiogram examinations.                  Signs/Symptoms:Bacteremia; Risk Factors:Hypertension.  Elevated                  troponin.  Sonographer:     Sheralyn Boatman RDCS Referring Phys:  [id number] [facility] Taniqua Issa Diagnosing Phys: Charlton Haws MD  Sonographer Comments: Technically difficult study due to poor echo windows. Patient moving throughout study IMPRESSIONS  1. Diffuse hypokinesis abnromal septal motion worse in inferior basal wall. Left ventricular ejection fraction, by estimation, is 45 to 50%. The left ventricle has mildly decreased function. The left ventricle demonstrates global hypokinesis. There is moderate left ventricular hypertrophy. Left ventricular diastolic parameters are consistent with Grade I diastolic dysfunction (impaired relaxation).  2. Right ventricular systolic function is normal. The right ventricular size is normal. There is mildly elevated pulmonary artery systolic pressure.  3. The pericardial effusion is posterior to the left ventricle.  4. The mitral valve is abnormal. Mild to moderate mitral valve regurgitation. No evidence of mitral stenosis.  5. The aortic valve is tricuspid. There is moderate calcification of the aortic valve. There is moderate thickening of the aortic valve. Aortic valve regurgitation is mild. Mild to moderate aortic valve sclerosis/calcification is present, without any evidence of aortic stenosis.  6. Aortic dilatation noted. There is mild dilatation of the ascending aorta, measuring 40 mm.  7. The inferior vena cava is normal in size with greater than 50% respiratory variability, suggesting right atrial pressure of 3 mmHg. FINDINGS  Left Ventricle: Diffuse hypokinesis abnromal septal motion worse in inferior basal wall. Left ventricular ejection fraction, by estimation, is 45 to 50%. The left ventricle has mildly decreased function. The left ventricle demonstrates global hypokinesis. The left ventricular internal cavity size was normal in size. There is moderate left ventricular hypertrophy. Left ventricular diastolic parameters are consistent with Grade I  diastolic dysfunction (impaired relaxation). Right Ventricle: The right ventricular size is normal. No increase in right ventricular wall thickness. Right ventricular systolic function is normal. There is mildly elevated pulmonary artery systolic pressure. The tricuspid regurgitant velocity is 2.72  m/s, and with an assumed right atrial pressure of 8 mmHg, the estimated right ventricular systolic pressure is 37.6 mmHg. Left Atrium: Left atrial size was normal in size. Right Atrium: Right atrial size was normal in size. Pericardium: Trivial pericardial effusion is present. The pericardial effusion is posterior to the left ventricle. Mitral Valve: The mitral valve is abnormal. There is mild thickening of the mitral valve leaflet(s). There is mild calcification of the mitral valve leaflet(s). Mild to moderate mitral valve regurgitation. No evidence of mitral valve stenosis. Tricuspid Valve: The tricuspid valve is normal in structure. Tricuspid valve regurgitation is not demonstrated. No evidence of tricuspid stenosis. Aortic Valve: The aortic valve is tricuspid. There is moderate calcification of the aortic valve. There is moderate thickening of the aortic valve. Aortic valve regurgitation is mild. Aortic  regurgitation PHT measures 406 msec. Mild to moderate aortic valve sclerosis/calcification is present, without any evidence of aortic stenosis. Pulmonic Valve: The pulmonic valve was normal in structure. Pulmonic valve regurgitation is not visualized. No evidence of pulmonic stenosis. Aorta: The aortic root is normal in size and structure and aortic dilatation noted. There is mild dilatation of the ascending aorta, measuring 40 mm. Venous: The inferior vena cava is normal in size with greater than 50% respiratory variability, suggesting right atrial pressure of 3 mmHg. IAS/Shunts: No atrial level shunt detected by color flow Doppler.  LEFT VENTRICLE PLAX 2D LVIDd:         3.20 cm     Diastology LVIDs:         2.20 cm      LV e' medial:    5.11 cm/s LV PW:         1.30 cm     LV E/e' medial:  16.0 LV IVS:        1.40 cm     LV e' lateral:   7.18 cm/s LVOT diam:     2.00 cm     LV E/e' lateral: 11.4 LV SV:         79 LV SV Index:   45 LVOT Area:     3.14 cm  LV Volumes (MOD) LV vol d, MOD A2C: 80.0 ml LV vol d, MOD A4C: 65.0 ml LV vol s, MOD A2C: 35.3 ml LV vol s, MOD A4C: 37.2 ml LV SV MOD A2C:     44.7 ml LV SV MOD A4C:     65.0 ml LV SV MOD BP:      39.1 ml RIGHT VENTRICLE             IVC RV S prime:     14.30 cm/s  IVC diam: 1.40 cm TAPSE (M-mode): 2.4 cm LEFT ATRIUM             Index       RIGHT ATRIUM           Index LA diam:        2.90 cm 1.67 cm/m  RA Area:     12.40 cm LA Vol (A2C):   55.1 ml 31.71 ml/m RA Volume:   25.30 ml  14.56 ml/m LA Vol (A4C):   35.2 ml 20.26 ml/m LA Biplane Vol: 46.8 ml 26.93 ml/m  AORTIC VALVE LVOT Vmax:   129.00 cm/s LVOT Vmean:  85.100 cm/s LVOT VTI:    0.250 m AI PHT:      406 msec  AORTA Ao Root diam: 3.60 cm Ao Asc diam:  4.00 cm MITRAL VALVE                 TRICUSPID VALVE MV Area (PHT): 5.02 cm      TR Peak grad:   29.6 mmHg MV Decel Time: 151 msec      TR Vmax:        272.00 cm/s MR Peak grad:    122.8 mmHg MR Mean grad:    95.0 mmHg   SHUNTS MR Vmax:         554.00 cm/s Systemic VTI:  0.25 m MR Vmean:        474.0 cm/s  Systemic Diam: 2.00 cm MR PISA:         1.57 cm MR PISA Eff ROA: 11 mm MR PISA Radius:  0.50 cm MV E velocity: 82.00 cm/s MV A velocity: 120.00 cm/s MV E/A  ratio:  0.68 Charlton Haws MD Electronically signed by Charlton Haws MD Signature Date/Time: 02/24/2021/1:44:11 PM    Final         Scheduled Meds: . Chlorhexidine Gluconate Cloth  6 each Topical Daily  . mupirocin ointment  1 application Nasal BID  . pantoprazole (PROTONIX) IV  40 mg Intravenous Q24H   Continuous Infusions: . cefTRIAXone (ROCEPHIN)  IV 2 g (02/24/21 2152)  . lactated ringers 50 mL/hr at 02/24/21 1723  . metronidazole 500 mg (02/25/21 1413)    Assessment & Plan:   Principal  Problem:   Acute pancreatitis Active Problems:   Severe sepsis (HCC)   Cholangitis   Abdominal pain   Nausea   Hypertension   GERD (gastroesophageal reflux disease)   Elevated troponin   #) Acute biliary pancreatitis:  GI Consulted, input was appreciated. Presumed biliary in the setting of cholelithiasis, dilated CBD.  None of the recent imaging confirms actual choledocholelithiasis so may have passed a stone ?cholangitis-on metro and rocephin Cardiology was consulted for preoperative stratification, has been cleared for surgery 4/30-plan for lap chole however surgery may be delayed due to equipment issues in the OR.  For now n.p.o. for possible surgery. Continue IV fluids since n.p.o.    #) Severe sepsis/enterobacters. And E. coli bacteremia Likely from above Urine culture insignificant growth Hemodynamically stable Patient was started continue Rocephin to treat for total of 10 days       #) Elevated troponin: Possibly secondary to demand ischemia  EKG no ischemic changes  4/29-Echo with EF of 45 to 50%.  Diffuse hypokinesis with abnormal septal motion worsening inferior basal wall 4/30-cardiology following..  May need to add low-dose carvedilol.  Attempt was elevated      #PSVT- brief run.  On 4/29 asx 4/30 Echo findings as above  Beta-blockers after surgery        #) Essential hypertension: Not on any antihypertensive medications at home. bp low to nml. Spike of high possibly 2/2 pain Low-dose beta-blockers postsurgery once BP stable       #) GERD: Continue IV PPI        DVT prophylaxis: SCD Code Status: Full Family Communication: Family at bedside  Status is: Inpatient  Remains inpatient appropriate because:Inpatient level of care appropriate due to severity of illness   Dispo: The patient is from: Home              Anticipated d/c is to: Home              Patient currently is not medically stable to d/c.   Difficult  to place patient No            LOS: 3 days   Time spent: 35 minutes with more than 50% on COC    Lynn Ito, MD [facility] Pager [phone]  If 7PM-7AM, please contact night-coverage 02/25/2021, 2:33 PM

## 2021-02-25 NOTE — Progress Notes (Signed)
[facility] night shift.  The staff reports that the patient a BP measurement of 185/85 mmHg manually.  His heart rate is ranging from 84 to 96 and most recently was 94 bpm.  He has a history of hypertension, but is not on antihypertensives at home.  Echocardiogram done yesterday revealed an EF of 45 to 50% and grade 1 diastolic dysfunction.  He is currently n.p.o.  Given age and combined systolic/diastolic HF will only give metoprolol 2.5 mg IVP x1 dose now.  The primary team will follow and decide on further treatment.  Sanda Klein, MD.

## 2021-02-25 NOTE — Plan of Care (Signed)

## 2021-02-25 NOTE — Progress Notes (Signed)
[facility] paged informed Bp 180/85 will continue to monitor

## 2021-02-25 NOTE — Progress Notes (Signed)
Subjective/Chief Complaint: Patient reports minimal abdominal pain No nausea or vomiting  Surgery originally scheduled for today, but there are some equipment issues in the operating room that may cause a delay until tomorrow.  If surgery is delayed until tomorrow, we will restart clear liquids for the rest of the day.   Objective: Vital signs in last 24 hours: Temp:  [97.9 F (36.6 C)-98.3 F (36.8 C)] 98.3 F (36.8 C) (04/30 0510) Pulse Rate:  [86-96] 94 (04/30 0510) Resp:  [20-24] 20 (04/29 2141) BP: (162-182)/(75-85) 180/85 (04/30 0541) SpO2:  [93 %-94 %] 94 % (04/30 0510) Weight:  [64 kg] 64 kg (04/30 0541)    Intake/Output from previous day: 04/29 0701 - 04/30 0700 In: 1180 [P.O.:1180] Out: 1550 [Urine:1550] Intake/Output this shift: No intake/output data recorded.  Elderly male - sitting in chair comfortably Abd - soft, minimal epigastric tenderness; no guarding No jaundice  Lab Results:  Recent Labs    02/23/21 0126 02/24/21 0455  WBC 7.6 9.5  HGB 12.7* 12.1*  HCT 38.1* 34.5*  PLT 89* 80*   BMET Recent Labs    02/23/21 0126 02/24/21 0455 02/25/21 0052  NA 138 136  --   K 3.6 3.2* 3.6  CL 106 106  --   CO2 20* 23  --   GLUCOSE 75 104*  --   BUN 23 18  --   CREATININE 1.27* 1.07  --   CALCIUM 8.2* 8.1*  --    PT/INR No results for input(s): LABPROT, INR in the last 72 hours. ABG No results for input(s): PHART, HCO3 in the last 72 hours.  Invalid input(s): PCO2, PO2  Studies/Results: ECHOCARDIOGRAM COMPLETE  Result Date: 02/24/2021    ECHOCARDIOGRAM REPORT   Patient Name:   Travis Gallagher [facility] Date of Exam: 02/24/2021 Medical Rec #:  [id number]                 Height:       67.0 in Accession #:    [id number]                Weight:       140.0 lb Date of Birth:  Jun 14, 1931                 BSA:          1.738 m Patient Age:    87 years                  BP:           176/81 mmHg Patient Gender: M                         HR:           89  bpm. Exam Location:  Inpatient Procedure: 2D Echo, 3D Echo, Cardiac Doppler and Color Doppler Indications:     Elevated troponin.; R07.9* Chest pain, unspecified  History:         Patient has no prior history of Echocardiogram examinations.                  Signs/Symptoms:Bacteremia; Risk Factors:Hypertension. Elevated                  troponin.  Sonographer:     Sheralyn Boatman RDCS Referring Phys:  [id number] [facility] AMERY Diagnosing Phys: Charlton Haws MD  Sonographer Comments: Technically difficult study due to poor echo windows. Patient moving throughout study IMPRESSIONS  1.  Diffuse hypokinesis abnromal septal motion worse in inferior basal wall. Left ventricular ejection fraction, by estimation, is 45 to 50%. The left ventricle has mildly decreased function. The left ventricle demonstrates global hypokinesis. There is moderate left ventricular hypertrophy. Left ventricular diastolic parameters are consistent with Grade I diastolic dysfunction (impaired relaxation).  2. Right ventricular systolic function is normal. The right ventricular size is normal. There is mildly elevated pulmonary artery systolic pressure.  3. The pericardial effusion is posterior to the left ventricle.  4. The mitral valve is abnormal. Mild to moderate mitral valve regurgitation. No evidence of mitral stenosis.  5. The aortic valve is tricuspid. There is moderate calcification of the aortic valve. There is moderate thickening of the aortic valve. Aortic valve regurgitation is mild. Mild to moderate aortic valve sclerosis/calcification is present, without any evidence of aortic stenosis.  6. Aortic dilatation noted. There is mild dilatation of the ascending aorta, measuring 40 mm.  7. The inferior vena cava is normal in size with greater than 50% respiratory variability, suggesting right atrial pressure of 3 mmHg. FINDINGS  Left Ventricle: Diffuse hypokinesis abnromal septal motion worse in inferior basal wall. Left ventricular ejection fraction,  by estimation, is 45 to 50%. The left ventricle has mildly decreased function. The left ventricle demonstrates global hypokinesis. The left ventricular internal cavity size was normal in size. There is moderate left ventricular hypertrophy. Left ventricular diastolic parameters are consistent with Grade I diastolic dysfunction (impaired relaxation). Right Ventricle: The right ventricular size is normal. No increase in right ventricular wall thickness. Right ventricular systolic function is normal. There is mildly elevated pulmonary artery systolic pressure. The tricuspid regurgitant velocity is 2.72  m/s, and with an assumed right atrial pressure of 8 mmHg, the estimated right ventricular systolic pressure is 37.6 mmHg. Left Atrium: Left atrial size was normal in size. Right Atrium: Right atrial size was normal in size. Pericardium: Trivial pericardial effusion is present. The pericardial effusion is posterior to the left ventricle. Mitral Valve: The mitral valve is abnormal. There is mild thickening of the mitral valve leaflet(s). There is mild calcification of the mitral valve leaflet(s). Mild to moderate mitral valve regurgitation. No evidence of mitral valve stenosis. Tricuspid Valve: The tricuspid valve is normal in structure. Tricuspid valve regurgitation is not demonstrated. No evidence of tricuspid stenosis. Aortic Valve: The aortic valve is tricuspid. There is moderate calcification of the aortic valve. There is moderate thickening of the aortic valve. Aortic valve regurgitation is mild. Aortic regurgitation PHT measures 406 msec. Mild to moderate aortic valve sclerosis/calcification is present, without any evidence of aortic stenosis. Pulmonic Valve: The pulmonic valve was normal in structure. Pulmonic valve regurgitation is not visualized. No evidence of pulmonic stenosis. Aorta: The aortic root is normal in size and structure and aortic dilatation noted. There is mild dilatation of the ascending aorta,  measuring 40 mm. Venous: The inferior vena cava is normal in size with greater than 50% respiratory variability, suggesting right atrial pressure of 3 mmHg. IAS/Shunts: No atrial level shunt detected by color flow Doppler.  LEFT VENTRICLE PLAX 2D LVIDd:         3.20 cm     Diastology LVIDs:         2.20 cm     LV e' medial:    5.11 cm/s LV PW:         1.30 cm     LV E/e' medial:  16.0 LV IVS:        1.40 cm  LV e' lateral:   7.18 cm/s LVOT diam:     2.00 cm     LV E/e' lateral: 11.4 LV SV:         79 LV SV Index:   45 LVOT Area:     3.14 cm  LV Volumes (MOD) LV vol d, MOD A2C: 80.0 ml LV vol d, MOD A4C: 65.0 ml LV vol s, MOD A2C: 35.3 ml LV vol s, MOD A4C: 37.2 ml LV SV MOD A2C:     44.7 ml LV SV MOD A4C:     65.0 ml LV SV MOD BP:      39.1 ml RIGHT VENTRICLE             IVC RV S prime:     14.30 cm/s  IVC diam: 1.40 cm TAPSE (M-mode): 2.4 cm LEFT ATRIUM             Index       RIGHT ATRIUM           Index LA diam:        2.90 cm 1.67 cm/m  RA Area:     12.40 cm LA Vol (A2C):   55.1 ml 31.71 ml/m RA Volume:   25.30 ml  14.56 ml/m LA Vol (A4C):   35.2 ml 20.26 ml/m LA Biplane Vol: 46.8 ml 26.93 ml/m  AORTIC VALVE LVOT Vmax:   129.00 cm/s LVOT Vmean:  85.100 cm/s LVOT VTI:    0.250 m AI PHT:      406 msec  AORTA Ao Root diam: 3.60 cm Ao Asc diam:  4.00 cm MITRAL VALVE                 TRICUSPID VALVE MV Area (PHT): 5.02 cm      TR Peak grad:   29.6 mmHg MV Decel Time: 151 msec      TR Vmax:        272.00 cm/s MR Peak grad:    122.8 mmHg MR Mean grad:    95.0 mmHg   SHUNTS MR Vmax:         554.00 cm/s Systemic VTI:  0.25 m MR Vmean:        474.0 cm/s  Systemic Diam: 2.00 cm MR PISA:         1.57 cm MR PISA Eff ROA: 11 mm MR PISA Radius:  0.50 cm MV E velocity: 82.00 cm/s MV A velocity: 120.00 cm/s MV E/A ratio:  0.68 Charlton HawsPeter Nishan MD Electronically signed by Charlton HawsPeter Nishan MD Signature Date/Time: 02/24/2021/1:44:11 PM    Final     Anti-infectives: Anti-infectives (From admission, onward)   Start      Dose/Rate Route Frequency Ordered Stop   02/22/21 2000  cefTRIAXone (ROCEPHIN) 2 g in sodium chloride 0.9 % 100 mL IVPB        2 g 200 mL/hr over 30 Minutes Intravenous Daily at 10 pm 02/22/21 1339     02/22/21 1600  metroNIDAZOLE (FLAGYL) IVPB 500 mg        500 mg 100 mL/hr over 60 Minutes Intravenous Every 8 hours 02/22/21 1339        Assessment/Plan: Elevated troponin-  32 >174 >112, EKG without ischemia changes, echo pending   E.coli, Enterobacterales bacteremia - IV abx per sensitivities  Acute biliary pancreatitis, suspect resolving ascending cholangitis  - afebrile, HR and BP are WNL, WBC normal - continues to have minimal epigastric tenderness - may benefit from cholecystectomy this admission pending further improvement  in pancreatitis and perioperative evaluation by cardiology. - continue bowel rest and IV abx. Encouraged patient to go slow with liquids as long as pain persists - cleared by cardiology - surgery might be delayed due to equipment issues in the operating room.    FEN: NPO for possible surgery, will restart if surgery delayed; LR 75 mL/hr ID: rocephin 4/27>, Flagyl 4/27> VTE: SCDs    LOS: 3 days    Wynona Luna 02/25/2021

## 2021-02-26 ENCOUNTER — Inpatient Hospital Stay (HOSPITAL_COMMUNITY): Payer: Self-pay | Admitting: Anesthesiology

## 2021-02-26 ENCOUNTER — Encounter (HOSPITAL_COMMUNITY): Payer: Self-pay | Admitting: Internal Medicine

## 2021-02-26 ENCOUNTER — Encounter (HOSPITAL_COMMUNITY)
Admission: AD | Disposition: A | Discharge status: Home / Self Care | Payer: Self-pay | Source: Other Acute Inpatient Hospital | Attending: Internal Medicine

## 2021-02-26 DIAGNOSIS — K859 Acute pancreatitis without necrosis or infection, unspecified: Secondary | ICD-10-CM

## 2021-02-26 DIAGNOSIS — R7881 Bacteremia: Secondary | ICD-10-CM

## 2021-02-26 DIAGNOSIS — A419 Sepsis, unspecified organism: Secondary | ICD-10-CM

## 2021-02-26 HISTORY — DX: Bacteremia: R78.81

## 2021-02-26 HISTORY — PX: CHOLECYSTECTOMY: SHX55

## 2021-02-26 HISTORY — DX: Sepsis, unspecified organism: A41.9

## 2021-02-26 SURGERY — LAPAROSCOPIC CHOLECYSTECTOMY WITH INTRAOPERATIVE CHOLANGIOGRAM
Anesthesia: General | Site: Abdomen

## 2021-02-26 MED ORDER — PROMETHAZINE HCL 25 MG/ML IJ SOLN: 6.2500 mg | INTRAMUSCULAR | Status: DC | PRN

## 2021-02-26 MED ORDER — INDOCYANINE GREEN 25 MG IV SOLR
INTRAVENOUS | Status: DC | PRN
  Administered 2021-02-26: 25 mg via INTRAVENOUS

## 2021-02-26 MED ORDER — SUGAMMADEX SODIUM 200 MG/2ML IV SOLN
INTRAVENOUS | Status: DC | PRN
  Administered 2021-02-26: 200 mg via INTRAVENOUS

## 2021-02-26 MED ORDER — ROCURONIUM BROMIDE 10 MG/ML (PF) SYRINGE
PREFILLED_SYRINGE | INTRAVENOUS | Status: DC | PRN
  Administered 2021-02-26: 50 mg via INTRAVENOUS

## 2021-02-26 MED ORDER — OXYCODONE HCL 5 MG PO TABS
5.0000 mg | ORAL_TABLET | ORAL | Status: DC | PRN
Start: 2021-02-26 — End: 2021-02-28
  Administered 2021-02-27 – 2021-02-28 (×5): 5 mg via ORAL
  Filled 2021-02-26 (×7): qty 1

## 2021-02-26 MED ORDER — ROCURONIUM BROMIDE 10 MG/ML (PF) SYRINGE
PREFILLED_SYRINGE | INTRAVENOUS | Status: AC
  Filled 2021-02-26: qty 10

## 2021-02-26 MED ORDER — ACETAMINOPHEN 10 MG/ML IV SOLN: 1000.0000 mg | Freq: Once | INTRAVENOUS | Status: DC | PRN

## 2021-02-26 MED ORDER — ACETAMINOPHEN 325 MG PO TABS: 325.0000 mg | ORAL_TABLET | ORAL | Status: DC | PRN

## 2021-02-26 MED ORDER — FENTANYL CITRATE (PF) 100 MCG/2ML IJ SOLN
INTRAMUSCULAR | Status: AC
  Administered 2021-02-26: 50 ug via INTRAVENOUS
  Filled 2021-02-26: qty 2

## 2021-02-26 MED ORDER — CHLORHEXIDINE GLUCONATE 0.12 % MT SOLN: 15.0000 mL | Freq: Once | OROMUCOSAL | Status: AC

## 2021-02-26 MED ORDER — PHENYLEPHRINE HCL-NACL 10-0.9 MG/250ML-% IV SOLN
INTRAVENOUS | Status: DC | PRN
  Administered 2021-02-26: 50 ug/min via INTRAVENOUS

## 2021-02-26 MED ORDER — PROPOFOL 10 MG/ML IV BOLUS
INTRAVENOUS | Status: AC
  Filled 2021-02-26: qty 20

## 2021-02-26 MED ORDER — CEFAZOLIN SODIUM-DEXTROSE 2-4 GM/100ML-% IV SOLN
INTRAVENOUS | Status: AC
  Filled 2021-02-26: qty 100

## 2021-02-26 MED ORDER — 0.9 % SODIUM CHLORIDE (POUR BTL) OPTIME
TOPICAL | Status: DC | PRN
  Administered 2021-02-26: 1000 mL

## 2021-02-26 MED ORDER — LIDOCAINE 2% (20 MG/ML) 5 ML SYRINGE
INTRAMUSCULAR | Status: AC
  Filled 2021-02-26: qty 5

## 2021-02-26 MED ORDER — DEXAMETHASONE SODIUM PHOSPHATE 10 MG/ML IJ SOLN
INTRAMUSCULAR | Status: DC | PRN
  Administered 2021-02-26: 10 mg via INTRAVENOUS

## 2021-02-26 MED ORDER — FENTANYL CITRATE (PF) 250 MCG/5ML IJ SOLN
INTRAMUSCULAR | Status: DC | PRN
  Administered 2021-02-26: 100 ug via INTRAVENOUS
  Administered 2021-02-26 (×2): 50 ug via INTRAVENOUS

## 2021-02-26 MED ORDER — SODIUM CHLORIDE 0.9 % IV SOLN: INTRAVENOUS | Status: DC | PRN

## 2021-02-26 MED ORDER — BUPIVACAINE-EPINEPHRINE 0.25% -1:200000 IJ SOLN
INTRAMUSCULAR | Status: DC | PRN
  Administered 2021-02-26: 8 mL

## 2021-02-26 MED ORDER — INDOCYANINE GREEN 25 MG IV SOLR
INTRAVENOUS | Status: AC
  Filled 2021-02-26: qty 10

## 2021-02-26 MED ORDER — METRONIDAZOLE 500 MG/100ML IV SOLN
500.0000 mg | Freq: Three times a day (TID) | INTRAVENOUS | Status: DC
  Administered 2021-02-26 – 2021-03-01 (×10): 500 mg via INTRAVENOUS
  Filled 2021-02-26 (×11): qty 100

## 2021-02-26 MED ORDER — BUPIVACAINE-EPINEPHRINE (PF) 0.25% -1:200000 IJ SOLN
INTRAMUSCULAR | Status: AC
  Filled 2021-02-26: qty 30

## 2021-02-26 MED ORDER — PROPOFOL 10 MG/ML IV BOLUS
INTRAVENOUS | Status: DC | PRN
  Administered 2021-02-26: 90 mg via INTRAVENOUS

## 2021-02-26 MED ORDER — MORPHINE SULFATE (PF) 2 MG/ML IV SOLN
1.0000 mg | INTRAVENOUS | Status: DC | PRN
  Administered 2021-02-26: 2 mg via INTRAVENOUS
  Administered 2021-02-26: 1 mg via INTRAVENOUS
  Administered 2021-02-26 – 2021-03-03 (×15): 2 mg via INTRAVENOUS
  Filled 2021-02-26 (×17): qty 1

## 2021-02-26 MED ORDER — ONDANSETRON HCL 4 MG/2ML IJ SOLN
INTRAMUSCULAR | Status: DC | PRN
  Administered 2021-02-26: 4 mg via INTRAVENOUS

## 2021-02-26 MED ORDER — OXYCODONE HCL 5 MG/5ML PO SOLN
5.0000 mg | Freq: Once | ORAL | Status: DC | PRN
Start: 2021-02-26 — End: 2021-02-26

## 2021-02-26 MED ORDER — CHLORHEXIDINE GLUCONATE 0.12 % MT SOLN
OROMUCOSAL | Status: AC
  Administered 2021-02-26: 15 mL via OROMUCOSAL
  Filled 2021-02-26: qty 15

## 2021-02-26 MED ORDER — FENTANYL CITRATE (PF) 100 MCG/2ML IJ SOLN
25.0000 ug | INTRAMUSCULAR | Status: DC | PRN
  Administered 2021-02-26: 50 ug via INTRAVENOUS

## 2021-02-26 MED ORDER — ACETAMINOPHEN 160 MG/5ML PO SOLN: 325.0000 mg | ORAL | Status: DC | PRN

## 2021-02-26 MED ORDER — OXYCODONE HCL 5 MG PO TABS: 5.0000 mg | ORAL_TABLET | Freq: Once | ORAL | Status: DC | PRN

## 2021-02-26 MED ORDER — ONDANSETRON HCL 4 MG/2ML IJ SOLN
INTRAMUSCULAR | Status: AC
  Filled 2021-02-26: qty 2

## 2021-02-26 MED ORDER — FENTANYL CITRATE (PF) 250 MCG/5ML IJ SOLN
INTRAMUSCULAR | Status: AC
  Filled 2021-02-26: qty 5

## 2021-02-26 MED ORDER — SODIUM CHLORIDE 0.9 % IR SOLN
Status: DC | PRN
  Administered 2021-02-26: 1000 mL

## 2021-02-26 MED ORDER — DEXAMETHASONE SODIUM PHOSPHATE 10 MG/ML IJ SOLN
INTRAMUSCULAR | Status: AC
  Filled 2021-02-26: qty 1

## 2021-02-26 MED ORDER — LIDOCAINE 2% (20 MG/ML) 5 ML SYRINGE
INTRAMUSCULAR | Status: DC | PRN
  Administered 2021-02-26: 40 mg via INTRAVENOUS

## 2021-02-26 MED ORDER — ACETAMINOPHEN 10 MG/ML IV SOLN
INTRAVENOUS | Status: AC
  Administered 2021-02-26: 1000 mg via INTRAVENOUS
  Filled 2021-02-26: qty 100

## 2021-02-26 MED ORDER — HYDRALAZINE HCL 20 MG/ML IJ SOLN
10.0000 mg | Freq: Four times a day (QID) | INTRAMUSCULAR | Status: DC | PRN
  Administered 2021-02-26: 10 mg via INTRAVENOUS
  Filled 2021-02-26: qty 1

## 2021-02-26 MED ORDER — AMISULPRIDE (ANTIEMETIC) 5 MG/2ML IV SOLN: 10.0000 mg | Freq: Once | INTRAVENOUS | Status: DC | PRN

## 2021-02-26 SURGICAL SUPPLY — 44 items
APPLIER CLIP 5 13 M/L LIGAMAX5 (MISCELLANEOUS) ×2
BIOPATCH RED 1 DISK 7.0 (GAUZE/BANDAGES/DRESSINGS) ×2 IMPLANT
CANISTER SUCT 3000ML PPV (MISCELLANEOUS) ×2 IMPLANT
CHLORAPREP W/TINT 26 (MISCELLANEOUS) ×2 IMPLANT
CLIP APPLIE 5 13 M/L LIGAMAX5 (MISCELLANEOUS) ×1 IMPLANT
COVER MAYO STAND STRL (DRAPES) ×2 IMPLANT
COVER SURGICAL LIGHT HANDLE (MISCELLANEOUS) ×2 IMPLANT
DERMABOND ADVANCED (GAUZE/BANDAGES/DRESSINGS) ×1
DERMABOND ADVANCED .7 DNX12 (GAUZE/BANDAGES/DRESSINGS) ×1 IMPLANT
DRAIN CHANNEL 19F RND (DRAIN) ×2 IMPLANT
DRAPE C-ARM 42X120 X-RAY (DRAPES) IMPLANT
DRSG TEGADERM 4X4.75 (GAUZE/BANDAGES/DRESSINGS) ×2 IMPLANT
ELECT REM PT RETURN 9FT ADLT (ELECTROSURGICAL) ×2
ELECTRODE REM PT RTRN 9FT ADLT (ELECTROSURGICAL) ×1 IMPLANT
EVACUATOR SILICONE 100CC (DRAIN) ×2 IMPLANT
GLOVE BIO SURGEON STRL SZ7 (GLOVE) ×8 IMPLANT
GLOVE BIOGEL PI IND STRL 7.5 (GLOVE) ×3 IMPLANT
GLOVE BIOGEL PI INDICATOR 7.5 (GLOVE) ×3
GOWN STRL REUS W/ TWL LRG LVL3 (GOWN DISPOSABLE) ×3 IMPLANT
GOWN STRL REUS W/TWL LRG LVL3 (GOWN DISPOSABLE) ×3
GRASPER SUT TROCAR 14GX15 (MISCELLANEOUS) ×2 IMPLANT
HEMOSTAT SNOW SURGICEL 2X4 (HEMOSTASIS) ×6 IMPLANT
KIT BASIN OR (CUSTOM PROCEDURE TRAY) ×2 IMPLANT
KIT TURNOVER KIT B (KITS) ×2 IMPLANT
NS IRRIG 1000ML POUR BTL (IV SOLUTION) ×2 IMPLANT
PAD ARMBOARD 7.5X6 YLW CONV (MISCELLANEOUS) ×2 IMPLANT
POUCH RETRIEVAL ECOSAC 10 (ENDOMECHANICALS) ×1 IMPLANT
POUCH RETRIEVAL ECOSAC 10MM (ENDOMECHANICALS) ×1
SCISSORS LAP 5X35 DISP (ENDOMECHANICALS) ×2 IMPLANT
SET CHOLANGIOGRAPH 5 50 .035 (SET/KITS/TRAYS/PACK) IMPLANT
SET IRRIG TUBING LAPAROSCOPIC (IRRIGATION / IRRIGATOR) ×2 IMPLANT
SET TUBE SMOKE EVAC HIGH FLOW (TUBING) ×2 IMPLANT
SLEEVE ENDOPATH XCEL 5M (ENDOMECHANICALS) ×6 IMPLANT
SPECIMEN JAR SMALL (MISCELLANEOUS) ×2 IMPLANT
STRIP CLOSURE SKIN 1/2X4 (GAUZE/BANDAGES/DRESSINGS) IMPLANT
SUT ETHILON 2 0 FS 18 (SUTURE) ×2 IMPLANT
SUT MNCRL AB 4-0 PS2 18 (SUTURE) ×2 IMPLANT
SUT VICRYL 0 UR6 27IN ABS (SUTURE) ×2 IMPLANT
TOWEL GREEN STERILE (TOWEL DISPOSABLE) ×2 IMPLANT
TOWEL GREEN STERILE FF (TOWEL DISPOSABLE) ×2 IMPLANT
TRAY LAPAROSCOPIC MC (CUSTOM PROCEDURE TRAY) ×2 IMPLANT
TROCAR XCEL BLUNT TIP 100MML (ENDOMECHANICALS) ×2 IMPLANT
TROCAR XCEL NON-BLD 5MMX100MML (ENDOMECHANICALS) ×2 IMPLANT
WATER STERILE IRR 1000ML POUR (IV SOLUTION) IMPLANT

## 2021-02-26 NOTE — Plan of Care (Signed)

## 2021-02-26 NOTE — Plan of Care (Signed)
Pt remains on a cl liquid diet, reports decreased appetite   Problem: Education: Goal: Knowledge of General Education information will improve Description: Including pain rating scale, medication(s)/side effects and non-pharmacologic comfort measures Outcome: Progressing   Problem: Health Behavior/Discharge Planning: Goal: Ability to manage health-related needs will improve Outcome: Progressing   Problem: Clinical Measurements: Goal: Ability to maintain clinical measurements within normal limits will improve Outcome: Progressing Goal: Will remain free from infection Outcome: Progressing Goal: Diagnostic test results will improve Outcome: Progressing Goal: Respiratory complications will improve Outcome: Progressing Goal: Cardiovascular complication will be avoided Outcome: Progressing   Problem: Activity: Goal: Risk for activity intolerance will decrease Outcome: Progressing   Problem: Nutrition: Goal: Adequate nutrition will be maintained Outcome: Not Progressing   Problem: Coping: Goal: Level of anxiety will decrease Outcome: Progressing   Problem: Elimination: Goal: Will not experience complications related to bowel motility Outcome: Progressing Goal: Will not experience complications related to urinary retention Outcome: Progressing   Problem: Pain Managment: Goal: General experience of comfort will improve Outcome: Progressing   Problem: Safety: Goal: Ability to remain free from injury will improve Outcome: Progressing   Problem: Skin Integrity: Goal: Risk for impaired skin integrity will decrease Outcome: Progressing

## 2021-02-26 NOTE — Op Note (Signed)
Preoperative diagnosis:resolved gallstone pancreatitis Postoperative diagnosis: Same as above, gangrenous cholecystitis Surgery: Laparoscopic cholecystectomy with ICG dye Surgeon: Dr. Harden Mo Anesthesia: General Estimated blood loss: Minimal Drains: None Specimens: Gallbladder and contents to pathology Complications: None Special count was correct x2 at end of operation Disposition to recovery stable condition  Indications:89 yom with resolved gallstone pancreatitis transferred from outside hospital.  We discussed via translator moving forward with lap chole.   Procedure: After informed consent was obtained the patient was taken to the operating room. He was given antibiotics. SCDs were in place. He was injected with ICG dye. He was placed under general anesthesia without complication. He was prepped and draped in the standard sterile surgical fashion. Surgical timeout was then performed.  I infiltrated Marcaine below his umbilicus.I made a vertical incision. I dissected down and grasped the fascia. I incised this sharply and entered the peritoneum bluntly. There was no evidence of an entry injury. I placed a 0 Vicryl pursestring suture through the fascia. I then inserted a Hassan trocar and insufflated the abdomen to 15 mmHg pressure. I then inserted 3 further 5 mm trocars in epigastrium and right side of the abdomen under direct vision without complication. His liver had multiple cysts as well as signs of cirrhosis.  The gallbladder was gangrenous.   I then retracted her gallbladder cephalad. As soon as I did this the gallbladder ripped and there was spillage of bile. There were omental adhesions that I took down bluntly. The duodenum was adherent also and this was dissected free.I then was able to retract it laterally. I eventually was able to dissect the triangle and obtain the critical view of safety. I used the green dye to confirm this view.I aborted the  cholangiogram due to the inflammation around the duct and common duct. I took the gallbladder off the cystic plate of the liver to ensure that this was the correct view. I then clipped the cystic duct with 3 clips and divided it. I left 2 clips in place. The duct was viable and the clips completely traversed the duct. I treated the artery in a similar fashion. I then took the gallbladder off the liver bed and placed it in a retrieval bag. Some of the back wall still remains and I cauterized this. I removed the gallbladder from the abdomen.  I then obtained hemostasis. Due to friable nature of his liver I placed surgicel snow in the gallbladder fossa.  I then removed the hasson trocar and tied the pursestring down.I placed an additional 0 vicryl suture as well.  I then placed a 19 Fr Blake drain in the gb fossa just in case in he needs ercp postop.  I secured this with a 2-0 nylon suture.  I irrigated and this was clear. I then desufflated the abdomen and removed the remaining trocars. I closed these with 4-0 Monocryl and glue. He tolerated this well was extubated and transferred to recovery stable.

## 2021-02-26 NOTE — Progress Notes (Signed)
Day of Surgery   Subjective/Chief Complaint: No pain   Objective: Vital signs in last 24 hours: Temp:  [98 F (36.7 C)-98.4 F (36.9 C)] 98.4 F (36.9 C) (05/01 0446) Pulse Rate:  [84-90] 84 (05/01 0446) Resp:  [18-19] 18 (05/01 0446) BP: (158-166)/(80-82) 158/82 (05/01 0446) SpO2:  [95 %-97 %] 97 % (05/01 0446) Last BM Date: 02/25/21  Intake/Output from previous day: 04/30 0701 - 05/01 0700 In: 3227 [P.O.:1680; I.V.:450; IV Piggyback:1097] Out: 1025 [Urine:1025] Intake/Output this shift: No intake/output data recorded.  GI: nontender nondistended   Lab Results:  Recent Labs    02/24/21 0455  WBC 9.5  HGB 12.1*  HCT 34.5*  PLT 80*   BMET Recent Labs    02/24/21 0455 02/25/21 0052  NA 136  --   K 3.2* 3.6  CL 106  --   CO2 23  --   GLUCOSE 104*  --   BUN 18  --   CREATININE 1.07  --   CALCIUM 8.1*  --    PT/INR No results for input(s): LABPROT, INR in the last 72 hours. ABG No results for input(s): PHART, HCO3 in the last 72 hours.  Invalid input(s): PCO2, PO2  Studies/Results: ECHOCARDIOGRAM COMPLETE  Result Date: 02/24/2021    ECHOCARDIOGRAM REPORT   Patient Name:   Travis Gallagher [facility] Date of Exam: 02/24/2021 Medical Rec #:  [id number]                 Height:       67.0 in Accession #:    [id number]                Weight:       140.0 lb Date of Birth:  Aug 12, 1931                 BSA:          1.738 m Patient Age:    87 years                  BP:           176/81 mmHg Patient Gender: M                         HR:           89 bpm. Exam Location:  Inpatient Procedure: 2D Echo, 3D Echo, Cardiac Doppler and Color Doppler Indications:     Elevated troponin.; R07.9* Chest pain, unspecified  History:         Patient has no prior history of Echocardiogram examinations.                  Signs/Symptoms:Bacteremia; Risk Factors:Hypertension. Elevated                  troponin.  Sonographer:     Sheralyn Boatman RDCS Referring Phys:  [id number] [facility] AMERY Diagnosing Phys:  Charlton Haws MD  Sonographer Comments: Technically difficult study due to poor echo windows. Patient moving throughout study IMPRESSIONS  1. Diffuse hypokinesis abnromal septal motion worse in inferior basal wall. Left ventricular ejection fraction, by estimation, is 45 to 50%. The left ventricle has mildly decreased function. The left ventricle demonstrates global hypokinesis. There is moderate left ventricular hypertrophy. Left ventricular diastolic parameters are consistent with Grade I diastolic dysfunction (impaired relaxation).  2. Right ventricular systolic function is normal. The right ventricular size is normal. There is mildly elevated pulmonary artery systolic pressure.  3.  The pericardial effusion is posterior to the left ventricle.  4. The mitral valve is abnormal. Mild to moderate mitral valve regurgitation. No evidence of mitral stenosis.  5. The aortic valve is tricuspid. There is moderate calcification of the aortic valve. There is moderate thickening of the aortic valve. Aortic valve regurgitation is mild. Mild to moderate aortic valve sclerosis/calcification is present, without any evidence of aortic stenosis.  6. Aortic dilatation noted. There is mild dilatation of the ascending aorta, measuring 40 mm.  7. The inferior vena cava is normal in size with greater than 50% respiratory variability, suggesting right atrial pressure of 3 mmHg. FINDINGS  Left Ventricle: Diffuse hypokinesis abnromal septal motion worse in inferior basal wall. Left ventricular ejection fraction, by estimation, is 45 to 50%. The left ventricle has mildly decreased function. The left ventricle demonstrates global hypokinesis. The left ventricular internal cavity size was normal in size. There is moderate left ventricular hypertrophy. Left ventricular diastolic parameters are consistent with Grade I diastolic dysfunction (impaired relaxation). Right Ventricle: The right ventricular size is normal. No increase in right  ventricular wall thickness. Right ventricular systolic function is normal. There is mildly elevated pulmonary artery systolic pressure. The tricuspid regurgitant velocity is 2.72  m/s, and with an assumed right atrial pressure of 8 mmHg, the estimated right ventricular systolic pressure is 37.6 mmHg. Left Atrium: Left atrial size was normal in size. Right Atrium: Right atrial size was normal in size. Pericardium: Trivial pericardial effusion is present. The pericardial effusion is posterior to the left ventricle. Mitral Valve: The mitral valve is abnormal. There is mild thickening of the mitral valve leaflet(s). There is mild calcification of the mitral valve leaflet(s). Mild to moderate mitral valve regurgitation. No evidence of mitral valve stenosis. Tricuspid Valve: The tricuspid valve is normal in structure. Tricuspid valve regurgitation is not demonstrated. No evidence of tricuspid stenosis. Aortic Valve: The aortic valve is tricuspid. There is moderate calcification of the aortic valve. There is moderate thickening of the aortic valve. Aortic valve regurgitation is mild. Aortic regurgitation PHT measures 406 msec. Mild to moderate aortic valve sclerosis/calcification is present, without any evidence of aortic stenosis. Pulmonic Valve: The pulmonic valve was normal in structure. Pulmonic valve regurgitation is not visualized. No evidence of pulmonic stenosis. Aorta: The aortic root is normal in size and structure and aortic dilatation noted. There is mild dilatation of the ascending aorta, measuring 40 mm. Venous: The inferior vena cava is normal in size with greater than 50% respiratory variability, suggesting right atrial pressure of 3 mmHg. IAS/Shunts: No atrial level shunt detected by color flow Doppler.  LEFT VENTRICLE PLAX 2D LVIDd:         3.20 cm     Diastology LVIDs:         2.20 cm     LV e' medial:    5.11 cm/s LV PW:         1.30 cm     LV E/e' medial:  16.0 LV IVS:        1.40 cm     LV e' lateral:    7.18 cm/s LVOT diam:     2.00 cm     LV E/e' lateral: 11.4 LV SV:         79 LV SV Index:   45 LVOT Area:     3.14 cm  LV Volumes (MOD) LV vol d, MOD A2C: 80.0 ml LV vol d, MOD A4C: 65.0 ml LV vol s, MOD A2C: 35.3 ml  LV vol s, MOD A4C: 37.2 ml LV SV MOD A2C:     44.7 ml LV SV MOD A4C:     65.0 ml LV SV MOD BP:      39.1 ml RIGHT VENTRICLE             IVC RV S prime:     14.30 cm/s  IVC diam: 1.40 cm TAPSE (M-mode): 2.4 cm LEFT ATRIUM             Index       RIGHT ATRIUM           Index LA diam:        2.90 cm 1.67 cm/m  RA Area:     12.40 cm LA Vol (A2C):   55.1 ml 31.71 ml/m RA Volume:   25.30 ml  14.56 ml/m LA Vol (A4C):   35.2 ml 20.26 ml/m LA Biplane Vol: 46.8 ml 26.93 ml/m  AORTIC VALVE LVOT Vmax:   129.00 cm/s LVOT Vmean:  85.100 cm/s LVOT VTI:    0.250 m AI PHT:      406 msec  AORTA Ao Root diam: 3.60 cm Ao Asc diam:  4.00 cm MITRAL VALVE                 TRICUSPID VALVE MV Area (PHT): 5.02 cm      TR Peak grad:   29.6 mmHg MV Decel Time: 151 msec      TR Vmax:        272.00 cm/s MR Peak grad:    122.8 mmHg MR Mean grad:    95.0 mmHg   SHUNTS MR Vmax:         554.00 cm/s Systemic VTI:  0.25 m MR Vmean:        474.0 cm/s  Systemic Diam: 2.00 cm MR PISA:         1.57 cm MR PISA Eff ROA: 11 mm MR PISA Radius:  0.50 cm MV E velocity: 82.00 cm/s MV A velocity: 120.00 cm/s MV E/A ratio:  0.68 Charlton Haws MD Electronically signed by Charlton Haws MD Signature Date/Time: 02/24/2021/1:44:11 PM    Final     Anti-infectives: Anti-infectives (From admission, onward)   Start     Dose/Rate Route Frequency Ordered Stop   02/22/21 2000  [MAR Hold]  cefTRIAXone (ROCEPHIN) 2 g in sodium chloride 0.9 % 100 mL IVPB        (MAR Hold since Sun 02/26/2021 at 0750.Hold Reason: Transfer to a Procedural area.)   2 g 200 mL/hr over 30 Minutes Intravenous Daily at 10 pm 02/22/21 1339     02/22/21 1600  [MAR Hold]  metroNIDAZOLE (FLAGYL) IVPB 500 mg        (MAR Hold since Sun 02/26/2021 at 0750.Hold Reason: Transfer to a  Procedural area.)   500 mg 100 mL/hr over 60 Minutes Intravenous Every 8 hours 02/22/21 1339        Assessment/Plan: Elevated troponin-  32 >174 >112, EKG without ischemia changes, cards cleared for surgery E.coli, Enterobacterales bacteremia - IV abx per sensitivities  Acute biliary pancreatitis, suspect resolving ascending cholangitis  - lap chole today Discussed via AMN translator again today  Travis Gallagher 02/26/2021

## 2021-02-26 NOTE — Anesthesia Preprocedure Evaluation (Addendum)
Anesthesia Evaluation  Patient identified by MRN, date of birth, ID band Patient awake    Reviewed: Allergy & Precautions, NPO status , Patient's Chart, lab work & pertinent test results  Airway Mallampati: I  TM Distance: >3 FB Neck ROM: Full    Dental  (+) Edentulous Upper, Edentulous Lower   Pulmonary neg pulmonary ROS,    breath sounds clear to auscultation       Cardiovascular hypertension,  Rhythm:Regular Rate:Normal     Neuro/Psych negative neurological ROS  negative psych ROS   GI/Hepatic Neg liver ROS, GERD  ,  Endo/Other  negative endocrine ROS  Renal/GU negative Renal ROS     Musculoskeletal   Abdominal Normal abdominal exam  (+)   Peds  Hematology negative hematology ROS (+)   Anesthesia Other Findings   Reproductive/Obstetrics                            Anesthesia Physical Anesthesia Plan  ASA: III  Anesthesia Plan: General   Post-op Pain Management:    Induction: Intravenous  PONV Risk Score and Plan: 3 and Ondansetron, Dexamethasone and Treatment may vary due to age or medical condition  Airway Management Planned: Oral ETT  Additional Equipment: None  Intra-op Plan:   Post-operative Plan: Extubation in OR  Informed Consent: I have reviewed the patients History and Physical, chart, labs and discussed the procedure including the risks, benefits and alternatives for the proposed anesthesia with the patient or authorized representative who has indicated his/her understanding and acceptance.     Interpreter used for [employer] Discussed with: CRNA  Anesthesia Plan Comments:        Anesthesia Quick Evaluation

## 2021-02-26 NOTE — Anesthesia Procedure Notes (Signed)
Procedure Name: Intubation Date/Time: 02/26/2021 9:02 AM Performed by: Mariea Clonts, CRNA Pre-anesthesia Checklist: Patient identified, Emergency Drugs available, Suction available and Patient being monitored Patient Re-evaluated:Patient Re-evaluated prior to induction Oxygen Delivery Method: Circle System Utilized Preoxygenation: Pre-oxygenation with 100% oxygen Induction Type: IV induction Ventilation: Mask ventilation without difficulty Laryngoscope Size: Mac and 3 Grade View: Grade I Tube type: Oral Tube size: 7.0 mm Number of attempts: 1 Airway Equipment and Method: Stylet and Oral airway Placement Confirmation: ETT inserted through vocal cords under direct vision,  positive ETCO2 and breath sounds checked- equal and bilateral Tube secured with: Tape Dental Injury: Teeth and Oropharynx as per pre-operative assessment

## 2021-02-26 NOTE — Progress Notes (Signed)
PROGRESS NOTE    Travis Gallagher  ZOX:[id number] DOB: 02/22/1931 DOA: 02/22/2021 PCP: Oneita Hurt, No    Brief Narrative:  Travis Gallagher is a 87 y.o. male with medical history significant for hypertension, GERD who is admitted to [facility] by way of transfer from [facility] ED for further evaluation and management of acute pancreatitis in setting of potential choledocholithiasis after presenting from home to [facility] ED complaining of abdominal pain  4/29-still with epigastric pain. Tele 8 beats svt  5/1-s/p laparoscopic cholecystectomy with ICG dye    Consultants:   GI, cardiology, general surgery  Procedures:   Antimicrobials:   Ceftriaxone and metronidazole   Subjective: Patient complaining of surgical pain.  No nausea or vomiting   Objective: Vitals:   02/25/21 0510 02/25/21 0541 02/25/21 2100 02/26/21 0446  BP: (!) 182/83 (!) 180/85 (!) 166/80 (!) 158/82  Pulse: 94  90 84  Resp:   19 18  Temp: 98.3 F (36.8 C)  98 F (36.7 C) 98.4 F (36.9 C)  TempSrc: Oral  Oral Oral  SpO2: 94%  95% 97%  Weight:  64 kg      Intake/Output Summary (Last 24 hours) at 02/26/2021 0937 Last data filed at 02/26/2021 0640 Gross per 24 hour  Intake 3226.97 ml  Output 850 ml  Net 2376.97 ml   Filed Weights   02/25/21 0541  Weight: 64 kg    Examination: Calm, NAD CTA no wheeze rales rhonchi's regular S1-S2 no gallops Soft tender mildly distended drain in place, decreased breath sounds No edema Awake and alert, grossly intact Mood and affect appropriate in current setting      Data Reviewed: I have personally reviewed following labs and imaging studies  CBC: Recent Labs  Lab 02/20/21 1803 02/21/21 2021 02/22/21 1245 02/23/21 0126 02/24/21 0455  WBC 6.4 14.7* 9.4 7.6 9.5  NEUTROABS 3.9 13.4* 8.4* 6.6  --   HGB 13.2 14.4 12.7* 12.7* 12.1*  HCT 37.9* 41.4 38.0* 38.1* 34.5*  MCV 85.6 85.4 89.2 89.0 86.5  PLT 144* 136* 95* 89* 80*   Basic Metabolic  Panel: Recent Labs  Lab 02/20/21 1803 02/21/21 2021 02/22/21 1245 02/23/21 0126 02/24/21 0455 02/25/21 0052  NA 138 137 138 138 136  --   K 3.6 3.7 3.9 3.6 3.2* 3.6  CL 103 105 107 106 106  --   CO2 24 19* 24 20* 23  --   GLUCOSE 119* 136* 90 75 104*  --   BUN --   CREATININE 1.17 1.05 1.25* 1.27* 1.07  --   CALCIUM 8.6* 8.6* 8.0* 8.2* 8.1*  --   MG  --   --  1.9 2.0  --   --    GFR: Estimated Creatinine Clearance: 44 mL/min (by C-G formula based on SCr of 1.07 mg/dL). Liver Function Tests: Recent Labs  Lab 02/20/21 1803 02/21/21 2021 02/22/21 1245 02/23/21 0126 02/24/21 0455  AST 46* 210* 102* 66* 30  ALT 23 185* 125* 95* 56*  ALKPHOS 106 209* 178* 169* 137*  BILITOT 0.9 6.3* 4.7* 2.6* 1.2  PROT 7.3 7.7 6.1* 6.2* 5.5*  ALBUMIN 3.5 3.5 2.8* 2.6* 2.1*   Recent Labs  Lab 02/20/21 1803 02/21/21 2021 02/23/21 0126 02/25/21 0052  LIPASE 80* 2,952* 382* 92*   No results for input(s): AMMONIA in the last 168 hours. Coagulation Profile: No results for input(s): INR, PROTIME in the last 168 hours. Cardiac Enzymes: No results for input(s): CKTOTAL, CKMB,  CKMBINDEX, TROPONINI in the last 168 hours. BNP (last 3 results) No results for input(s): PROBNP in the last 8760 hours. HbA1C: No results for input(s): HGBA1C in the last 72 hours. CBG: No results for input(s): GLUCAP in the last 168 hours. Lipid Profile: No results for input(s): CHOL, HDL, LDLCALC, TRIG, CHOLHDL, LDLDIRECT in the last 72 hours. Thyroid Function Tests: No results for input(s): TSH, T4TOTAL, FREET4, T3FREE, THYROIDAB in the last 72 hours. Anemia Panel: No results for input(s): VITAMINB12, FOLATE, FERRITIN, TIBC, IRON, RETICCTPCT in the last 72 hours. Sepsis Labs: Recent Labs  Lab 02/21/21 2021 02/21/21 2207  PROCALCITON 0.96  --   LATICACIDVEN 2.4* 1.1    Recent Results (from the past 240 hour(s))  Blood culture (routine x 2)     Status: Abnormal   Collection Time:  02/21/21  8:21 PM   Specimen: BLOOD  Result Value Ref Range Status   Specimen Description   Final    BLOOD LEFT ANTECUBITAL Performed at [facility], [address], [place], [place] [address]    Special Requests   Final    BOTTLES DRAWN AEROBIC AND ANAEROBIC Blood Culture adequate volume Performed at [facility], [address] [place], [place] [address]    Culture  Setup Time   Final    GRAM NEGATIVE RODS IN BOTH AEROBIC AND ANAEROBIC BOTTLES CRITICAL RESULT CALLED TO, READ BACK BY AND VERIFIED WITH: AMY THOMPSON AT 0840 02/22/21 SDR Performed at [facility], [address], [place], [place] [address]    Culture ESCHERICHIA COLI (A)  Final   Report Status 02/24/2021 FINAL  Final   Organism ID, Bacteria ESCHERICHIA COLI  Final      Susceptibility   Escherichia coli - MIC*    AMPICILLIN 8 SENSITIVE Sensitive     CEFAZOLIN <=4 SENSITIVE Sensitive     CEFEPIME <=0.12 SENSITIVE Sensitive     CEFTAZIDIME <=1 SENSITIVE Sensitive     CEFTRIAXONE <=0.25 SENSITIVE Sensitive     CIPROFLOXACIN <=0.25 SENSITIVE Sensitive     GENTAMICIN <=1 SENSITIVE Sensitive     IMIPENEM <=0.25 SENSITIVE Sensitive     TRIMETH/SULFA <=20 SENSITIVE Sensitive     AMPICILLIN/SULBACTAM 4 SENSITIVE Sensitive     PIP/TAZO <=4 SENSITIVE Sensitive     * ESCHERICHIA COLI  Blood Culture ID Panel (Reflexed)     Status: Abnormal   Collection Time: 02/21/21  8:21 PM  Result Value Ref Range Status   Enterococcus faecalis NOT DETECTED NOT DETECTED Final   Enterococcus Faecium NOT DETECTED NOT DETECTED Final   Listeria monocytogenes NOT DETECTED NOT DETECTED Final   Staphylococcus species NOT DETECTED NOT DETECTED Final   Staphylococcus aureus (BCID) NOT DETECTED NOT DETECTED Final   Staphylococcus epidermidis NOT DETECTED NOT DETECTED Final   Staphylococcus lugdunensis NOT DETECTED NOT DETECTED Final   Streptococcus species NOT DETECTED NOT DETECTED Final   Streptococcus agalactiae  NOT DETECTED NOT DETECTED Final   Streptococcus pneumoniae NOT DETECTED NOT DETECTED Final   Streptococcus pyogenes NOT DETECTED NOT DETECTED Final   A.calcoaceticus-baumannii NOT DETECTED NOT DETECTED Final   Bacteroides fragilis NOT DETECTED NOT DETECTED Final   Enterobacterales DETECTED (A) NOT DETECTED Final    Comment: Enterobacterales represent a large order of gram negative bacteria, not a single organism. CRITICAL RESULT CALLED TO, READ BACK BY AND VERIFIED WITH:  AMY THOMPSON AT 0840 02/22/21 SDR    Enterobacter cloacae complex NOT DETECTED NOT DETECTED Final   Escherichia coli DETECTED (A) NOT DETECTED  Final    Comment: CRITICAL RESULT CALLED TO, READ BACK BY AND VERIFIED WITH:  AMY THOMPSON AT 0840 02/22/21 SDR    Klebsiella aerogenes NOT DETECTED NOT DETECTED Final   Klebsiella oxytoca NOT DETECTED NOT DETECTED Final   Klebsiella pneumoniae NOT DETECTED NOT DETECTED Final   Proteus species NOT DETECTED NOT DETECTED Final   Salmonella species NOT DETECTED NOT DETECTED Final   Serratia marcescens NOT DETECTED NOT DETECTED Final   Haemophilus influenzae NOT DETECTED NOT DETECTED Final   Neisseria meningitidis NOT DETECTED NOT DETECTED Final   Pseudomonas aeruginosa NOT DETECTED NOT DETECTED Final   Stenotrophomonas maltophilia NOT DETECTED NOT DETECTED Final   Candida albicans NOT DETECTED NOT DETECTED Final   Candida auris NOT DETECTED NOT DETECTED Final   Candida glabrata NOT DETECTED NOT DETECTED Final   Candida krusei NOT DETECTED NOT DETECTED Final   Candida parapsilosis NOT DETECTED NOT DETECTED Final   Candida tropicalis NOT DETECTED NOT DETECTED Final   Cryptococcus neoformans/gattii NOT DETECTED NOT DETECTED Final   CTX-M ESBL NOT DETECTED NOT DETECTED Final   Carbapenem resistance IMP NOT DETECTED NOT DETECTED Final   Carbapenem resistance KPC NOT DETECTED NOT DETECTED Final   Carbapenem resistance NDM NOT DETECTED NOT DETECTED Final   Carbapenem resist OXA 48  LIKE NOT DETECTED NOT DETECTED Final   Carbapenem resistance VIM NOT DETECTED NOT DETECTED Final    Comment: Performed at [facility], [address], [place], [place] [address]  Urine culture     Status: Abnormal   Collection Time: 02/21/21  8:30 PM   Specimen: Urine, Random  Result Value Ref Range Status   Specimen Description   Final    URINE, RANDOM Performed at [facility], [address] [place], [place] [address]    Special Requests   Final    NONE Performed at [facility], [address], [place], [place] [address]    Culture (A)  Final    <10,000 COLONIES/mL INSIGNIFICANT GROWTH Performed at [facility], [address] [place], [place] [address]    Report Status 02/23/2021 FINAL  Final  Blood culture (routine x 2)     Status: Abnormal   Collection Time: 02/21/21  8:39 PM   Specimen: BLOOD  Result Value Ref Range Status   Specimen Description   Final    BLOOD BLOOD RIGHT WRIST Performed at [facility], [address] [place], [place] [address]    Special Requests   Final    BOTTLES DRAWN AEROBIC AND ANAEROBIC Blood Culture adequate volume Performed at [facility], [address] [place], [place] [address]    Culture  Setup Time   Final    GRAM NEGATIVE RODS IN BOTH AEROBIC AND ANAEROBIC BOTTLES CRITICAL VALUE NOTED.  VALUE IS CONSISTENT WITH PREVIOUSLY REPORTED AND CALLED VALUE. Performed at [facility] [address] [place], [place] [address]    Culture (A)  Final    ESCHERICHIA COLI SUSCEPTIBILITIES PERFORMED ON PREVIOUS CULTURE WITHIN THE LAST 5 DAYS. Performed at [facility], [address], [place], [place] [address]    Report Status 02/24/2021 FINAL  Final  Resp Panel by RT-PCR (Flu A&B, Covid) Nasopharyngeal Swab     Status: None   Collection Time: 02/21/21  8:39 PM   Specimen: Nasopharyngeal Swab; Nasopharyngeal(NP) swabs in vial transport medium  Result Value Ref  Range Status   SARS Coronavirus 2 by RT PCR NEGATIVE NEGATIVE Final  Comment: (NOTE) SARS-CoV-2 target nucleic acids are NOT DETECTED.  The SARS-CoV-2 RNA is generally detectable in upper respiratory specimens during the acute phase of infection. The lowest concentration of SARS-CoV-2 viral copies this assay can detect is 138 copies/mL. A negative result does not preclude SARS-Cov-2 infection and should not be used as the sole basis for treatment or other patient management decisions. A negative result may occur with  improper specimen collection/handling, submission of specimen other than nasopharyngeal swab, presence of viral mutation(s) within the areas targeted by this assay, and inadequate number of viral copies(<138 copies/mL). A negative result must be combined with clinical observations, patient history, and epidemiological information. The expected result is Negative.  Fact Sheet for Patients:  BloggerCourse.comhttps://www.fda.gov/media/152166/download  Fact Sheet for Healthcare Providers:  SeriousBroker.ithttps://www.fda.gov/media/152162/download  This test is no t yet approved or cleared by the [place] FDA and  has been authorized for detection and/or diagnosis of SARS-CoV-2 by FDA under an Emergency Use Authorization (EUA). This EUA will remain  in effect (meaning this test can be used) for the duration of the COVID-19 declaration under Section 564(b)(1) of the Act, 21 U.S.C.section 360bbb-3(b)(1), unless the authorization is terminated  or revoked sooner.       Influenza A by PCR NEGATIVE NEGATIVE Final   Influenza B by PCR NEGATIVE NEGATIVE Final    Comment: (NOTE) The Xpert Xpress SARS-CoV-2/FLU/RSV plus assay is intended as an aid in the diagnosis of influenza from Nasopharyngeal swab specimens and should not be used as a sole basis for treatment. Nasal washings and aspirates are unacceptable for Xpert Xpress SARS-CoV-2/FLU/RSV testing.  Fact Sheet for  Patients: BloggerCourse.comhttps://www.fda.gov/media/152166/download  Fact Sheet for Healthcare Providers: SeriousBroker.ithttps://www.fda.gov/media/152162/download  This test is not yet approved or cleared by the [place] FDA and has been authorized for detection and/or diagnosis of SARS-CoV-2 by FDA under an Emergency Use Authorization (EUA). This EUA will remain in effect (meaning this test can be used) for the duration of the COVID-19 declaration under Section 564(b)(1) of the Act, 21 U.S.C. section 360bbb-3(b)(1), unless the authorization is terminated or revoked.  Performed at [facility], [address] [place], [place] [address]   Surgical pcr screen     Status: Abnormal   Collection Time: 02/23/21  8:25 PM   Specimen: Nasal Mucosa; Nasal Swab  Result Value Ref Range Status   MRSA, PCR NEGATIVE NEGATIVE Final   Staphylococcus aureus POSITIVE (A) NEGATIVE Final    Comment: (NOTE) The Xpert SA Assay (FDA approved for NASAL specimens in patients 85 years of age and older), is one component of a comprehensive surveillance program. It is not intended to diagnose infection nor to guide or monitor treatment. Performed at [facility], [address] [place], [place] [address]          Radiology Studies: ECHOCARDIOGRAM COMPLETE  Result Date: 02/24/2021    ECHOCARDIOGRAM REPORT   Patient Name:   Travis Gallagher Date of Exam: 02/24/2021 Medical Rec #:  [id number]                 Height:       67.0 in Accession #:    [id number]                Weight:       140.0 lb Date of Birth:  05/09/1931                 BSA:          1.738 m  Patient Age:    89 years                  BP:           176/81 mmHg Patient Gender: M                         HR:           89 bpm. Exam Location:  Inpatient Procedure: 2D Echo, 3D Echo, Cardiac Doppler and Color Doppler Indications:     Elevated troponin.; R07.9* Chest pain, unspecified  History:         Patient has no prior history of Echocardiogram  examinations.                  Signs/Symptoms:Bacteremia; Risk Factors:Hypertension. Elevated                  troponin.  Sonographer:     Sheralyn Boatman RDCS Referring Phys:  [id number] [facility] Law [place] Diagnosing Phys: Charlton Haws MD  Sonographer Comments: Technically difficult study due to poor echo windows. Patient moving throughout study IMPRESSIONS  1. Diffuse hypokinesis abnromal septal motion worse in inferior basal wall. Left ventricular ejection fraction, by estimation, is 45 to 50%. The left ventricle has mildly decreased function. The left ventricle demonstrates global hypokinesis. There is moderate left ventricular hypertrophy. Left ventricular diastolic parameters are consistent with Grade I diastolic dysfunction (impaired relaxation).  2. Right ventricular systolic function is normal. The right ventricular size is normal. There is mildly elevated pulmonary artery systolic pressure.  3. The pericardial effusion is posterior to the left ventricle.  4. The mitral valve is abnormal. Mild to moderate mitral valve regurgitation. No evidence of mitral stenosis.  5. The aortic valve is tricuspid. There is moderate calcification of the aortic valve. There is moderate thickening of the aortic valve. Aortic valve regurgitation is mild. Mild to moderate aortic valve sclerosis/calcification is present, without any evidence of aortic stenosis.  6. Aortic dilatation noted. There is mild dilatation of the ascending aorta, measuring 40 mm.  7. The inferior vena cava is normal in size with greater than 50% respiratory variability, suggesting right atrial pressure of 3 mmHg. FINDINGS  Left Ventricle: Diffuse hypokinesis abnromal septal motion worse in inferior basal wall. Left ventricular ejection fraction, by estimation, is 45 to 50%. The left ventricle has mildly decreased function. The left ventricle demonstrates global hypokinesis. The left ventricular internal cavity size was normal in size. There is moderate left ventricular  hypertrophy. Left ventricular diastolic parameters are consistent with Grade I diastolic dysfunction (impaired relaxation). Right Ventricle: The right ventricular size is normal. No increase in right ventricular wall thickness. Right ventricular systolic function is normal. There is mildly elevated pulmonary artery systolic pressure. The tricuspid regurgitant velocity is 2.72  m/s, and with an assumed right atrial pressure of 8 mmHg, the estimated right ventricular systolic pressure is 37.6 mmHg. Left Atrium: Left atrial size was normal in size. Right Atrium: Right atrial size was normal in size. Pericardium: Trivial pericardial effusion is present. The pericardial effusion is posterior to the left ventricle. Mitral Valve: The mitral valve is abnormal. There is mild thickening of the mitral valve leaflet(s). There is mild calcification of the mitral valve leaflet(s). Mild to moderate mitral valve regurgitation. No evidence of mitral valve stenosis. Tricuspid Valve: The tricuspid valve is normal in structure. Tricuspid valve regurgitation is not demonstrated. No evidence of tricuspid stenosis. Aortic Valve: The aortic valve  is tricuspid. There is moderate calcification of the aortic valve. There is moderate thickening of the aortic valve. Aortic valve regurgitation is mild. Aortic regurgitation PHT measures 406 msec. Mild to moderate aortic valve sclerosis/calcification is present, without any evidence of aortic stenosis. Pulmonic Valve: The pulmonic valve was normal in structure. Pulmonic valve regurgitation is not visualized. No evidence of pulmonic stenosis. Aorta: The aortic root is normal in size and structure and aortic dilatation noted. There is mild dilatation of the ascending aorta, measuring 40 mm. Venous: The inferior vena cava is normal in size with greater than 50% respiratory variability, suggesting right atrial pressure of 3 mmHg. IAS/Shunts: No atrial level shunt detected by color flow Doppler.  LEFT  VENTRICLE PLAX 2D LVIDd:         3.20 cm     Diastology LVIDs:         2.20 cm     LV e' medial:    5.11 cm/s LV PW:         1.30 cm     LV E/e' medial:  16.0 LV IVS:        1.40 cm     LV e' lateral:   7.18 cm/s LVOT diam:     2.00 cm     LV E/e' lateral: 11.4 LV SV:         79 LV SV Index:   45 LVOT Area:     3.14 cm  LV Volumes (MOD) LV vol d, MOD A2C: 80.0 ml LV vol d, MOD A4C: 65.0 ml LV vol s, MOD A2C: 35.3 ml LV vol s, MOD A4C: 37.2 ml LV SV MOD A2C:     44.7 ml LV SV MOD A4C:     65.0 ml LV SV MOD BP:      39.1 ml RIGHT VENTRICLE             IVC RV S prime:     14.30 cm/s  IVC diam: 1.40 cm TAPSE (M-mode): 2.4 cm LEFT ATRIUM             Index       RIGHT ATRIUM           Index LA diam:        2.90 cm 1.67 cm/m  RA Area:     12.40 cm LA Vol (A2C):   55.1 ml 31.71 ml/m RA Volume:   25.30 ml  14.56 ml/m LA Vol (A4C):   35.2 ml 20.26 ml/m LA Biplane Vol: 46.8 ml 26.93 ml/m  AORTIC VALVE LVOT Vmax:   129.00 cm/s LVOT Vmean:  85.100 cm/s LVOT VTI:    0.250 m AI PHT:      406 msec  AORTA Ao Root diam: 3.60 cm Ao Asc diam:  4.00 cm MITRAL VALVE                 TRICUSPID VALVE MV Area (PHT): 5.02 cm      TR Peak grad:   29.6 mmHg MV Decel Time: 151 msec      TR Vmax:        272.00 cm/s MR Peak grad:    122.8 mmHg MR Mean grad:    95.0 mmHg   SHUNTS MR Vmax:         554.00 cm/s Systemic VTI:  0.25 m MR Vmean:        474.0 cm/s  Systemic Diam: 2.00 cm MR PISA:         1.57 cm  MR PISA Eff ROA: 11 mm MR PISA Radius:  0.50 cm MV E velocity: 82.00 cm/s MV A velocity: 120.00 cm/s MV E/A ratio:  0.68 Charlton Haws MD Electronically signed by Charlton Haws MD Signature Date/Time: 02/24/2021/1:44:11 PM    Final         Scheduled Meds: . [MAR Hold] Chlorhexidine Gluconate Cloth  6 each Topical Daily  . [MAR Hold] mupirocin ointment  1 application Nasal BID  . [MAR Hold] pantoprazole (PROTONIX) IV  40 mg Intravenous Q24H   Continuous Infusions: . [MAR Hold] cefTRIAXone (ROCEPHIN)  IV Stopped (02/25/21 2308)   . lactated ringers 50 mL/hr at 02/26/21 0852  . [MAR Hold] metronidazole 500 mg (02/26/21 0640)    Assessment & Plan:   Principal Problem:   Acute pancreatitis Active Problems:   Severe sepsis (HCC)   Cholangitis   Abdominal pain   Nausea   Hypertension   GERD (gastroesophageal reflux disease)   Elevated troponin   #) Acute biliary pancreatitis:  GI Consulted, input was appreciated. Presumed biliary in the setting of cholelithiasis, dilated CBD.  None of the recent imaging confirms actual choledocholelithiasis so may have passed a stone ?cholangitis-on metro and rocephin Cardiology was consulted for preoperative stratification, has been cleared for surgery 5/1-s/p lap chole. By surgery Drain in place Pain control antiemetic    #) Severe sepsis/enterobacters. and E. coli bacteremia Likely from above Urine culture insignificant growth 5/1-continue Rocephin to treat total of 10 days         #) Elevated troponin: Possibly secondary to demand ischemia  EKG no ischemic changes  4/29-Echo with EF of 45 to 50%.  Diffuse hypokinesis with abnormal septal motion worsening inferior basal wall 5/1-cardiology following. Once stable from surgery , will need to add low dose beta blk if HR and bp allows.      #PSVT- brief run.  On 4/29 asx 5/1-echo findings Ef 45-50%, diffuse hypokinesis with abn. Septal motion worsening inferior basal wall Will assess for beta blk post surgery         #) Essential hypertension: Not on any antihypertensive medications at home. bp low to nml. Spike of high possibly 2/2 pain Low-dose beta-blockers postsurgery once BP stable 5/1 for now add hydralazine iv prn       #) GERD: Continue IV PPI        DVT prophylaxis: SCD Code Status: Full Family Communication: Family at bedside  Status is: Inpatient  Remains inpatient appropriate because:Inpatient level of care appropriate due to severity of  illness   Dispo: The patient is from: Home              Anticipated d/c is to: Home              Patient currently is not medically stable to d/c.   Difficult to place patient No            LOS: 4 days   Time spent: 35 minutes with more than 50% on COC    Lynn Ito, MD [facility] Pager [phone]  If 7PM-7AM, please contact night-coverage 02/26/2021, 9:37 AM

## 2021-02-26 NOTE — Transfer of Care (Signed)
Immediate Anesthesia Transfer of Care Note  Patient: Travis Gallagher  Procedure(s) Performed: LAPAROSCOPIC CHOLECYSTECTOMY (N/A Abdomen)  Patient Location: PACU  Anesthesia Type:General  Level of Consciousness: awake  Airway & Oxygen Therapy: Patient Spontanous Breathing and Patient connected to nasal cannula oxygen  Post-op Assessment: Report given to RN, Post -op Vital signs reviewed and stable and Patient moving all extremities X 4  Post vital signs: Reviewed and stable  Last Vitals:  Vitals Value Taken Time  BP 173/82 02/26/21 1121  Temp 36.7 C 02/26/21 1121  Pulse 80 02/26/21 1121  Resp 20 02/26/21 1121  SpO2 91 % 02/26/21 1121    Last Pain:  Vitals:   02/26/21 1121  TempSrc: Oral  PainSc:       Patients Stated Pain Goal: 0 (02/24/21 1903)  Complications: No complications documented.

## 2021-02-27 ENCOUNTER — Encounter (HOSPITAL_COMMUNITY): Payer: Self-pay | Admitting: General Surgery

## 2021-02-27 LAB — COMPREHENSIVE METABOLIC PANEL
ALT: 39 U/L (ref 0–44)
AST: 54 U/L — ABNORMAL HIGH (ref 15–41)
Albumin: 2 g/dL — ABNORMAL LOW (ref 3.5–5.0)
Alkaline Phosphatase: 150 U/L — ABNORMAL HIGH (ref 38–126)
Anion gap: 9 (ref 5–15)
BUN: 20 mg/dL (ref 8–23)
CO2: 22 mmol/L (ref 22–32)
Calcium: 7.7 mg/dL — ABNORMAL LOW (ref 8.9–10.3)
Chloride: 109 mmol/L (ref 98–111)
Creatinine, Ser: 1.11 mg/dL (ref 0.61–1.24)
GFR, Estimated: >60 mL/min (ref >60)
Glucose, Bld: 141 mg/dL — ABNORMAL HIGH (ref 70–99)
Potassium: 3.9 mmol/L (ref 3.5–5.1)
Sodium: 140 mmol/L (ref 135–145)
Total Bilirubin: 1 mg/dL (ref 0.3–1.2)
Total Protein: 5.5 g/dL — ABNORMAL LOW (ref 6.5–8.1)

## 2021-02-27 LAB — CBC
HCT: 38.1 % — ABNORMAL LOW (ref 39.0–52.0)
Hemoglobin: 12.8 g/dL — ABNORMAL LOW (ref 13.0–17.0)
MCH: 29 pg (ref 26.0–34.0)
MCHC: 33.6 g/dL (ref 30.0–36.0)
MCV: 86.4 fL (ref 80.0–100.0)
Platelets: 149 10*3/uL — ABNORMAL LOW (ref 150–400)
RBC: 4.41 MIL/uL (ref 4.22–5.81)
RDW: 13 % (ref 11.5–15.5)
WBC: 8.4 10*3/uL (ref 4.0–10.5)
nRBC: 0 % (ref 0.0–0.2)

## 2021-02-27 MED ORDER — MELATONIN 5 MG PO TABS
10.0000 mg | ORAL_TABLET | Freq: Every evening | ORAL | Status: DC | PRN
  Administered 2021-02-28 (×2): 10 mg via ORAL
  Filled 2021-02-27 (×2): qty 2

## 2021-02-27 MED ORDER — CARVEDILOL 3.125 MG PO TABS
3.1250 mg | ORAL_TABLET | Freq: Two times a day (BID) | ORAL | Status: DC
  Administered 2021-02-27: 3.125 mg via ORAL
  Filled 2021-02-27 (×2): qty 1

## 2021-02-27 MED ORDER — BISACODYL 5 MG PO TBEC
5.0000 mg | DELAYED_RELEASE_TABLET | Freq: Every day | ORAL | Status: DC
  Administered 2021-02-27 – 2021-02-28 (×2): 5 mg via ORAL
  Filled 2021-02-27 (×2): qty 1

## 2021-02-27 NOTE — Progress Notes (Signed)
Progress Note  1 Day Post-Op  Subjective: CC: has not been ambulatory. Tolerating soft without nausea or emesis. Passing flatus but no BM. He is on supplemental O2 but denies SHOB. Prior to admission he was ambulatory without assistance and was not on supplemental O2.  JP drain output - 10 mL  Objective: Vital signs in last 24 hours: Temp:  [98 F (36.7 C)-98.8 F (37.1 C)] 98.2 F (36.8 C) (05/02 0604) Pulse Rate:  [80-92] 92 (05/02 0604) Resp:  [15-24] 17 (05/02 0604) BP: (155-186)/(69-89) 155/78 (05/02 0604) SpO2:  [88 %-99 %] 88 % (05/02 0604) Weight:  [69.5 kg] 69.5 kg (05/02 0604) Last BM Date: 02/25/21  Intake/Output from previous day: 05/01 0701 - 05/02 0700 In: 900 [I.V.:900] Out: 310 [Urine:250; Drains:10; Blood:50] Intake/Output this shift: No intake/output data recorded.  PE: General: pleasant, male who is laying in bed in NAD HEENT: head is normocephalic, atraumatic.  Ears and nose without any masses or lesions.  Mouth is pink and moist Heart: regular, rate, and rhythm.  Normal s1,s2. No obvious murmurs, gallops, or rubs noted.  Palpable radial and pedal pulses bilaterally Lungs: CTAB, no wheezes, rhonchi, or rales noted.  Respiratory effort nonlabored on supplemental O2 Abd: BS present, mildly distended and diffusely tender to palpation greatest in right lower quadrant around drain. Drain with scant bile tinged blood. Steri strips intact over incisions - no erythema or purulent discharge MS: all 4 extremities are symmetrical with no cyanosis, clubbing, or edema. Skin: warm and dry with no masses, lesions, or rashes Neuro: Cranial nerves 2-12 grossly intact, sensation is normal throughout Psych: A&Ox3 with an appropriate affect.    Lab Results:  Recent Labs    02/27/21 0048  WBC 8.4  HGB 12.8*  HCT 38.1*  PLT 149*   BMET Recent Labs    02/25/21 0052 02/27/21 0048  NA  --  140  K 3.6 3.9  CL  --  109  CO2  --  22  GLUCOSE  --  141*  BUN  --   20  CREATININE  --  1.11  CALCIUM  --  7.7*   PT/INR No results for input(s): LABPROT, INR in the last 72 hours. CMP     Component Value Date/Time   NA 140 02/27/2021 0048   K 3.9 02/27/2021 0048   CL 109 02/27/2021 0048   CO2 22 02/27/2021 0048   GLUCOSE 141 (H) 02/27/2021 0048   BUN 20 02/27/2021 0048   CREATININE 1.11 02/27/2021 0048   CALCIUM 7.7 (L) 02/27/2021 0048   PROT 5.5 (L) 02/27/2021 0048   ALBUMIN 2.0 (L) 02/27/2021 0048   AST 54 (H) 02/27/2021 0048   ALT 39 02/27/2021 0048   ALKPHOS 150 (H) 02/27/2021 0048   BILITOT 1.0 02/27/2021 0048   GFRNONAA >60 02/27/2021 0048   Lipase     Component Value Date/Time   LIPASE 92 (H) 02/25/2021 0052       Studies/Results: No results found.  Anti-infectives: Anti-infectives (From admission, onward)   Start     Dose/Rate Route Frequency Ordered Stop   02/26/21 1500  metroNIDAZOLE (FLAGYL) IVPB 500 mg        500 mg 100 mL/hr over 60 Minutes Intravenous Every 8 hours 02/26/21 1414     02/22/21 2000  cefTRIAXone (ROCEPHIN) 2 g in sodium chloride 0.9 % 100 mL IVPB        2 g 200 mL/hr over 30 Minutes Intravenous Daily at 10 pm 02/22/21 1339  02/22/21 1600  metroNIDAZOLE (FLAGYL) IVPB 500 mg  Status:  Discontinued        500 mg 100 mL/hr over 60 Minutes Intravenous Every 8 hours 02/22/21 1339 02/26/21 1414       Assessment/Plan  Elevated troponin-  32 >174 >112, EKG without ischemia changes, cards cleared for surgery E.coli, Enterobacterales bacteremia - IV abx per sensitivities  Acute biliary pancreatitis, suspect resolving ascending cholangitis, gangrenous cholecystitis s/p lap chole with Dr. Dwain Sarna on 5/1 - operative findings of liver cysts and cirrhosis - continue soft diet but encouraged patient to go very slowly given ongoing distension - PT ordered - encouraged patient to ambulate - monitor JP drain  Interpretor services were used for the duration of interview and PE  FEN: GI soft, LR 50  mL/hr ID: ceftriaxone, flagyl VTE: SCDs   LOS: 5 days    Eric Form, [facility] Surgery 02/27/2021, 8:09 AM Please see Amion for pager number during day hours 7:00am-4:30pm

## 2021-02-27 NOTE — Progress Notes (Signed)
PROGRESS NOTE    Travis Gallagher  WGY:[id number] DOB: 09/10/1931 DOA: 02/22/2021 PCP: Oneita Hurt, No    Brief Narrative:  Travis Gallagher is a 87 y.o. male with medical history significant for hypertension, GERD who is admitted to [facility] by way of transfer from [facility] ED for further evaluation and management of acute pancreatitis in setting of potential choledocholithiasis after presenting from home to [facility] ED complaining of abdominal pain  4/29-still with epigastric pain. Tele 8 beats svt  5/1-s/p laparoscopic cholecystectomy with ICG dye 5/2-complaining of pain.  On soft diet  Consultants:   GI, cardiology, general surgery  Procedures:   Antimicrobials:   Ceftriaxone and metronidazole   Subjective: No nausea or vomiting   Objective: Vitals:   02/26/21 1546 02/26/21 1749 02/26/21 2045 02/27/21 0604  BP: (!) 186/75 (!) 168/89 (!) 159/83 (!) 155/78  Pulse: 86 91 91 92  Resp: 15 20 17 17   Temp: 98.1 F (36.7 C) 98.8 F (37.1 C) 98.4 F (36.9 C) 98.2 F (36.8 C)  TempSrc:  Oral    SpO2: 90% 92% 91% (!) 88%  Weight:    69.5 kg    Intake/Output Summary (Last 24 hours) at 02/27/2021 0850 Last data filed at 02/27/2021 0300 Gross per 24 hour  Intake 900 ml  Output 310 ml  Net 590 ml   Filed Weights   02/25/21 0541 02/27/21 0604  Weight: 64 kg 69.5 kg    Examination: NAD, family at bedside CTA no wheezing Regular S1-S2 no gallops Soft distended positive JP drain No edema Awake and alert Affect appropriate in current setting      Data Reviewed: I have personally reviewed following labs and imaging studies  CBC: Recent Labs  Lab 02/20/21 1803 02/21/21 2021 02/22/21 1245 02/23/21 0126 02/24/21 0455 02/27/21 0048  WBC 6.4 14.7* 9.4 7.6 9.5 8.4  NEUTROABS 3.9 13.4* 8.4* 6.6  --   --   HGB 13.2 14.4 12.7* 12.7* 12.1* 12.8*  HCT 37.9* 41.4 38.0* 38.1* 34.5* 38.1*  MCV 85.6 85.4 89.2 89.0 86.5 86.4  PLT 144* 136* 95* 89* 80*  149*   Basic Metabolic Panel: Recent Labs  Lab 02/21/21 2021 02/22/21 1245 02/23/21 0126 02/24/21 0455 02/25/21 0052 02/27/21 0048  NA 137 138 138 136  --  140  K 3.7 3.9 3.6 3.2* 3.6 3.9  CL 105 107 106 106  --  109  CO2 19* 24 20* 23  --  22  GLUCOSE 136* 90 75 104*  --  141*  BUN 21 18 23 18   --  20  CREATININE 1.05 1.25* 1.27* 1.07  --  1.11  CALCIUM 8.6* 8.0* 8.2* 8.1*  --  7.7*  MG  --  1.9 2.0  --   --   --    GFR: Estimated Creatinine Clearance: 43.8 mL/min (by C-G formula based on SCr of 1.11 mg/dL). Liver Function Tests: Recent Labs  Lab 02/21/21 2021 02/22/21 1245 02/23/21 0126 02/24/21 0455 02/27/21 0048  AST 210* 102* 66* 30 54*  ALT 185* 125* 95* 56* 39  ALKPHOS 209* 178* 169* 137* 150*  BILITOT 6.3* 4.7* 2.6* 1.2 1.0  PROT 7.7 6.1* 6.2* 5.5* 5.5*  ALBUMIN 3.5 2.8* 2.6* 2.1* 2.0*   Recent Labs  Lab 02/20/21 1803 02/21/21 2021 02/23/21 0126 02/25/21 0052  LIPASE 80* 2,952* 382* 92*   No results for input(s): AMMONIA in the last 168 hours. Coagulation Profile: No results for input(s): INR, PROTIME in the last 168 hours.  Cardiac Enzymes: No results for input(s): CKTOTAL, CKMB, CKMBINDEX, TROPONINI in the last 168 hours. BNP (last 3 results) No results for input(s): PROBNP in the last 8760 hours. HbA1C: No results for input(s): HGBA1C in the last 72 hours. CBG: No results for input(s): GLUCAP in the last 168 hours. Lipid Profile: No results for input(s): CHOL, HDL, LDLCALC, TRIG, CHOLHDL, LDLDIRECT in the last 72 hours. Thyroid Function Tests: No results for input(s): TSH, T4TOTAL, FREET4, T3FREE, THYROIDAB in the last 72 hours. Anemia Panel: No results for input(s): VITAMINB12, FOLATE, FERRITIN, TIBC, IRON, RETICCTPCT in the last 72 hours. Sepsis Labs: Recent Labs  Lab 02/21/21 2021 02/21/21 2207  PROCALCITON 0.96  --   LATICACIDVEN 2.4* 1.1    Recent Results (from the past 240 hour(s))  Blood culture (routine x 2)     Status:  Abnormal   Collection Time: 02/21/21  8:21 PM   Specimen: BLOOD  Result Value Ref Range Status   Specimen Description   Final    BLOOD LEFT ANTECUBITAL Performed at [facility], [address] [place], [place] [address]    Special Requests   Final    BOTTLES DRAWN AEROBIC AND ANAEROBIC Blood Culture adequate volume Performed at [facility], [address] [place], [place] [address]    Culture  Setup Time   Final    GRAM NEGATIVE RODS IN BOTH AEROBIC AND ANAEROBIC BOTTLES CRITICAL RESULT CALLED TO, READ BACK BY AND VERIFIED WITH: AMY THOMPSON AT 0840 02/22/21 SDR Performed at [facility], [address] [place], [place] [address]    Culture ESCHERICHIA COLI (A)  Final   Report Status 02/24/2021 FINAL  Final   Organism ID, Bacteria ESCHERICHIA COLI  Final      Susceptibility   Escherichia coli - MIC*    AMPICILLIN 8 SENSITIVE Sensitive     CEFAZOLIN <=4 SENSITIVE Sensitive     CEFEPIME <=0.12 SENSITIVE Sensitive     CEFTAZIDIME <=1 SENSITIVE Sensitive     CEFTRIAXONE <=0.25 SENSITIVE Sensitive     CIPROFLOXACIN <=0.25 SENSITIVE Sensitive     GENTAMICIN <=1 SENSITIVE Sensitive     IMIPENEM <=0.25 SENSITIVE Sensitive     TRIMETH/SULFA <=20 SENSITIVE Sensitive     AMPICILLIN/SULBACTAM 4 SENSITIVE Sensitive     PIP/TAZO <=4 SENSITIVE Sensitive     * ESCHERICHIA COLI  Blood Culture ID Panel (Reflexed)     Status: Abnormal   Collection Time: 02/21/21  8:21 PM  Result Value Ref Range Status   Enterococcus faecalis NOT DETECTED NOT DETECTED Final   Enterococcus Faecium NOT DETECTED NOT DETECTED Final   Listeria monocytogenes NOT DETECTED NOT DETECTED Final   Staphylococcus species NOT DETECTED NOT DETECTED Final   Staphylococcus aureus (BCID) NOT DETECTED NOT DETECTED Final   Staphylococcus epidermidis NOT DETECTED NOT DETECTED Final   Staphylococcus lugdunensis NOT DETECTED NOT DETECTED Final   Streptococcus species NOT DETECTED NOT DETECTED Final    Streptococcus agalactiae NOT DETECTED NOT DETECTED Final   Streptococcus pneumoniae NOT DETECTED NOT DETECTED Final   Streptococcus pyogenes NOT DETECTED NOT DETECTED Final   A.calcoaceticus-baumannii NOT DETECTED NOT DETECTED Final   Bacteroides fragilis NOT DETECTED NOT DETECTED Final   Enterobacterales DETECTED (A) NOT DETECTED Final    Comment: Enterobacterales represent a large order of gram negative bacteria, not a single organism. CRITICAL RESULT CALLED TO, READ BACK BY AND VERIFIED WITH:  AMY THOMPSON AT 0840 02/22/21 SDR    Enterobacter cloacae complex NOT DETECTED NOT DETECTED Final  Escherichia coli DETECTED (A) NOT DETECTED Final    Comment: CRITICAL RESULT CALLED TO, READ BACK BY AND VERIFIED WITH:  AMY THOMPSON AT 0840 02/22/21 SDR    Klebsiella aerogenes NOT DETECTED NOT DETECTED Final   Klebsiella oxytoca NOT DETECTED NOT DETECTED Final   Klebsiella pneumoniae NOT DETECTED NOT DETECTED Final   Proteus species NOT DETECTED NOT DETECTED Final   Salmonella species NOT DETECTED NOT DETECTED Final   Serratia marcescens NOT DETECTED NOT DETECTED Final   Haemophilus influenzae NOT DETECTED NOT DETECTED Final   Neisseria meningitidis NOT DETECTED NOT DETECTED Final   Pseudomonas aeruginosa NOT DETECTED NOT DETECTED Final   Stenotrophomonas maltophilia NOT DETECTED NOT DETECTED Final   Candida albicans NOT DETECTED NOT DETECTED Final   Candida auris NOT DETECTED NOT DETECTED Final   Candida glabrata NOT DETECTED NOT DETECTED Final   Candida krusei NOT DETECTED NOT DETECTED Final   Candida parapsilosis NOT DETECTED NOT DETECTED Final   Candida tropicalis NOT DETECTED NOT DETECTED Final   Cryptococcus neoformans/gattii NOT DETECTED NOT DETECTED Final   CTX-M ESBL NOT DETECTED NOT DETECTED Final   Carbapenem resistance IMP NOT DETECTED NOT DETECTED Final   Carbapenem resistance KPC NOT DETECTED NOT DETECTED Final   Carbapenem resistance NDM NOT DETECTED NOT DETECTED Final    Carbapenem resist OXA 48 LIKE NOT DETECTED NOT DETECTED Final   Carbapenem resistance VIM NOT DETECTED NOT DETECTED Final    Comment: Performed at [facility], [address], [place], [place] [address]  Urine culture     Status: Abnormal   Collection Time: 02/21/21  8:30 PM   Specimen: Urine, Random  Result Value Ref Range Status   Specimen Description   Final    URINE, RANDOM Performed at [facility], [address] [place], [place] [address]    Special Requests   Final    NONE Performed at [facility], [address], [place], [place] [address]    Culture (A)  Final    <10,000 COLONIES/mL INSIGNIFICANT GROWTH Performed at [facility], [address], [place], [place] [address]    Report Status 02/23/2021 FINAL  Final  Blood culture (routine x 2)     Status: Abnormal   Collection Time: 02/21/21  8:39 PM   Specimen: BLOOD  Result Value Ref Range Status   Specimen Description   Final    BLOOD BLOOD RIGHT WRIST Performed at [facility], [address], [place], [place] [address]    Special Requests   Final    BOTTLES DRAWN AEROBIC AND ANAEROBIC Blood Culture adequate volume Performed at [facility], [address] [place], [place] [address]    Culture  Setup Time   Final    GRAM NEGATIVE RODS IN BOTH AEROBIC AND ANAEROBIC BOTTLES CRITICAL VALUE NOTED.  VALUE IS CONSISTENT WITH PREVIOUSLY REPORTED AND CALLED VALUE. Performed at [facility], [address] [place], [place] [address]    Culture (A)  Final    ESCHERICHIA COLI SUSCEPTIBILITIES PERFORMED ON PREVIOUS CULTURE WITHIN THE LAST 5 DAYS. Performed at [facility], [address], [place], [place] [address]    Report Status 02/24/2021 FINAL  Final  Resp Panel by RT-PCR (Flu A&B, Covid) Nasopharyngeal Swab     Status: None   Collection Time: 02/21/21  8:39 PM   Specimen: Nasopharyngeal Swab; Nasopharyngeal(NP) swabs in vial transport  medium  Result Value Ref Range Status   SARS Coronavirus 2 by RT  PCR NEGATIVE NEGATIVE Final    Comment: (NOTE) SARS-CoV-2 target nucleic acids are NOT DETECTED.  The SARS-CoV-2 RNA is generally detectable in upper respiratory specimens during the acute phase of infection. The lowest concentration of SARS-CoV-2 viral copies this assay can detect is 138 copies/mL. A negative result does not preclude SARS-Cov-2 infection and should not be used as the sole basis for treatment or other patient management decisions. A negative result may occur with  improper specimen collection/handling, submission of specimen other than nasopharyngeal swab, presence of viral mutation(s) within the areas targeted by this assay, and inadequate number of viral copies(<138 copies/mL). A negative result must be combined with clinical observations, patient history, and epidemiological information. The expected result is Negative.  Fact Sheet for Patients:  BloggerCourse.com  Fact Sheet for Healthcare Providers:  SeriousBroker.it  This test is no t yet approved or cleared by the [place] FDA and  has been authorized for detection and/or diagnosis of SARS-CoV-2 by FDA under an Emergency Use Authorization (EUA). This EUA will remain  in effect (meaning this test can be used) for the duration of the COVID-19 declaration under Section 564(b)(1) of the Act, 21 U.S.C.section 360bbb-3(b)(1), unless the authorization is terminated  or revoked sooner.       Influenza A by PCR NEGATIVE NEGATIVE Final   Influenza B by PCR NEGATIVE NEGATIVE Final    Comment: (NOTE) The Xpert Xpress SARS-CoV-2/FLU/RSV plus assay is intended as an aid in the diagnosis of influenza from Nasopharyngeal swab specimens and should not be used as a sole basis for treatment. Nasal washings and aspirates are unacceptable for Xpert Xpress SARS-CoV-2/FLU/RSV testing.  Fact Sheet for  Patients: BloggerCourse.com  Fact Sheet for Healthcare Providers: SeriousBroker.it  This test is not yet approved or cleared by the [place] FDA and has been authorized for detection and/or diagnosis of SARS-CoV-2 by FDA under an Emergency Use Authorization (EUA). This EUA will remain in effect (meaning this test can be used) for the duration of the COVID-19 declaration under Section 564(b)(1) of the Act, 21 U.S.C. section 360bbb-3(b)(1), unless the authorization is terminated or revoked.  Performed at [facility], [address], [place], [place] [address]   Surgical pcr screen     Status: Abnormal   Collection Time: 02/23/21  8:25 PM   Specimen: Nasal Mucosa; Nasal Swab  Result Value Ref Range Status   MRSA, PCR NEGATIVE NEGATIVE Final   Staphylococcus aureus POSITIVE (A) NEGATIVE Final    Comment: (NOTE) The Xpert SA Assay (FDA approved for NASAL specimens in patients 49 years of age and older), is one component of a comprehensive surveillance program. It is not intended to diagnose infection nor to guide or monitor treatment. Performed at [facility], [address], [place], [place] [address]          Radiology Studies: No results found.      Scheduled Meds: . Chlorhexidine Gluconate Cloth  6 each Topical Daily  . mupirocin ointment  1 application Nasal BID  . pantoprazole (PROTONIX) IV  40 mg Intravenous Q24H   Continuous Infusions: . cefTRIAXone (ROCEPHIN)  IV 2 g (02/26/21 2038)  . lactated ringers 50 mL/hr at 02/26/21 1132  . metronidazole 500 mg (02/27/21 0601)    Assessment & Plan:   Principal Problem:   Acute pancreatitis Active Problems:   Severe sepsis (HCC)   Cholangitis   Abdominal pain   Nausea   Hypertension   GERD (gastroesophageal reflux disease)   Elevated  troponin   #) Acute biliary pancreatitis:  GI Consulted, input was appreciated. Presumed biliary  in the setting of cholelithiasis, dilated CBD.  None of the recent imaging confirms actual choledocholelithiasis so may have passed a stone ?cholangitis-on metro and rocephin Cardiology was consulted for preoperative stratification, has been cleared for surgery 5/20 status post lap chole by Dr. Cleophas Dunker on 5/1  Drain in place  Soft diet  Pain control   Home   #) Severe sepsis/enterobacters. and E. coli bacteremia Likely from above Urine culture insignificant growth 5/2 continue Rocephin for total 10 days      #) Elevated troponin: Possibly secondary to demand ischemia  EKG no ischemic changes  4/29-Echo with EF of 45 to 50%.  Diffuse hypokinesis with abnormal septal motion worsening inferior basal wall 5/2 cardiology following. Will need outpatient follow-up hold off aspirin given recent postop status until he is cleared by general surgery Start low-dose carvedilol      #PSVT- brief run.  On 4/29 asx echo findings Ef 45-50%, diffuse hypokinesis with abn. Septal motion worsening inferior basal wall 5/2 we will start carvedilol 3.125 mg twice daily        #) Essential hypertension: Not on any antihypertensive medications at home. bp low to nml. Spike of high possibly 2/2 pain 5/2 start carvedilol as above      #) GERD: Continue IV PPI        DVT prophylaxis: SCD Code Status: Full Family Communication: Family at bedside  Status is: Inpatient  Remains inpatient appropriate because:Inpatient level of care appropriate due to severity of illness   Dispo: The patient is from: Home              Anticipated d/c is to: Home              Patient currently is not medically stable to d/c.   Difficult to place patient No            LOS: 5 days   Time spent: 35 minutes with more than 50% on COC    Lynn Ito, MD [facility] Pager [phone]  If 7PM-7AM, please contact night-coverage 02/27/2021, 8:50 AM

## 2021-02-27 NOTE — Care Management (Signed)
    Durable Medical Equipment  (From admission, onward)         Start     Ordered   02/27/21 1650  For home use only DME standard manual wheelchair with seat cushion  Once       Comments: Patient suffers from Acute biliary pancreatitis which impairs their ability to perform daily activities like ambulating  in the home.  A cane will not resolve issue with performing activities of daily living. A wheelchair will allow patient to safely perform daily activities. Patient can safely propel the wheelchair in the home or has a caregiver who can provide assistance. Length of need lifetime . Accessories: elevating leg rests (ELRs), wheel locks, extensions and anti-tippers.  Seat and back cushions   02/27/21 1650   02/27/21 1649  For home use only DME Walker rolling  Once       Question Answer Comment  Walker: With 5 Inch Wheels   Patient needs a walker to treat with the following condition Weakness      02/27/21 1648

## 2021-02-27 NOTE — Evaluation (Addendum)
Physical Therapy Evaluation Patient Details Name: Travis Gallagher MRN: [id number] DOB: [birth year] Today's Date: 02/27/2021   History of Present Illness  Pt is a 87 y.o. M admitted with gallstone pancreatitis, underwent laparoscopic cholecystectomy with gangrenous cholecystitis. Also found to have LV dysfunction with mild-moderate mitral regurgitation, PSVT, elevated troponin. Significant PMH: HTN, GERD, Visiting from [place].  Clinical Impression  Pt admitted with above. Plan to d/c home with son and daughter in law. Pt presents with generalized weakness, balance deficits, and decreased endurance. SpO2 87-89% sitting on RA, increased to 91% with ambulation while still on RA. Requiring min assist for functional mobility. Ambulating x 60 feet with a walker. Provided pt daughter in law with gait belt for home. See below for recommendations.   Ashby Dawes, in house interpreter, utilized for this session    Follow Up Recommendations Home health PT;Supervision for mobility/OOB    Equipment Recommendations  Rolling walker with 5" wheels;3in1 (PT);Wheelchair (measurements PT);Wheelchair cushion (measurements PT)    Recommendations for Other Services       Precautions / Restrictions Precautions Precautions: Fall Restrictions Weight Bearing Restrictions: No      Mobility  Bed Mobility               General bed mobility comments: OOB in chair    Transfers Overall transfer level: Needs assistance Equipment used: Rolling walker (2 wheeled) Transfers: Sit to/from Stand Sit to Stand: Min assist         General transfer comment: MinA to rise and steady, cues for hand placement  Ambulation/Gait Ambulation/Gait assistance: Min assist Gait Distance (Feet): 60 Feet Assistive device: Rolling walker (2 wheeled) Gait Pattern/deviations: Step-through pattern;Decreased stride length;Trunk flexed Gait velocity: decreased Gait velocity interpretation: <1.8 ft/sec, indicate of  risk for recurrent falls General Gait Details: Cues for walker proximity, increased bilateral foot clearance, activity pacing. MinA for balance, bilateral knee instability noted  Stairs            Wheelchair Mobility    Modified Rankin (Stroke Patients Only)       Balance Overall balance assessment: Needs assistance Sitting-balance support: Feet supported Sitting balance-Leahy Scale: Good     Standing balance support: Bilateral upper extremity supported Standing balance-Leahy Scale: Poor Standing balance comment: reliant on external support                             Pertinent Vitals/Pain Pain Assessment: Faces Faces Pain Scale: Hurts even more Pain Location: mid to left lower abdomen Pain Descriptors / Indicators: Discomfort;Grimacing;Guarding Pain Intervention(s): Limited activity within patient's tolerance;Monitored during session    Home Living Family/patient expects to be discharged to:: Private residence Living Arrangements: Children (son, DIL) Available Help at Discharge: Family Type of Home: House Home Access: Stairs to enter   Secretary/administrator of Steps: 3 Home Layout: Able to live on main level with bedroom/bathroom Home Equipment: None      Prior Function Level of Independence: Independent         Comments: From [place], visiting     Hand Dominance        Extremity/Trunk Assessment   Upper Extremity Assessment Upper Extremity Assessment: Generalized weakness    Lower Extremity Assessment Lower Extremity Assessment: Generalized weakness       Communication   Communication: Prefers language other than English  Cognition Arousal/Alertness: Awake/alert Behavior During Therapy: WFL for tasks assessed/performed Overall Cognitive Status: Within Functional Limits for tasks assessed  General Comments      Exercises     Assessment/Plan    PT Assessment  Patient needs continued PT services  PT Problem List Decreased strength;Decreased activity tolerance;Decreased mobility;Decreased balance;Pain       PT Treatment Interventions DME instruction;Gait training;Stair training;Functional mobility training;Therapeutic activities;Therapeutic exercise;Balance training;Patient/family education    PT Goals (Current goals can be found in the Care Plan section)  Acute Rehab PT Goals Patient Stated Goal: get stronger PT Goal Formulation: With patient Time For Goal Achievement: 03/13/21 Potential to Achieve Goals: Good    Frequency Min 3X/week   Barriers to discharge        Co-evaluation               AM-PAC PT "6 Clicks" Mobility  Outcome Measure Help needed turning from your back to your side while in a flat bed without using bedrails?: None Help needed moving from lying on your back to sitting on the side of a flat bed without using bedrails?: A Little Help needed moving to and from a bed to a chair (including a wheelchair)?: A Little Help needed standing up from a chair using your arms (e.g., wheelchair or bedside chair)?: A Little Help needed to walk in hospital room?: A Little Help needed climbing 3-5 steps with a railing? : A Lot 6 Click Score: 18    End of Session   Activity Tolerance: Patient tolerated treatment well Patient left: in chair;with call bell/phone within reach;with chair alarm set;with family/visitor present Nurse Communication: Mobility status PT Visit Diagnosis: Unsteadiness on feet (R26.81);Muscle weakness (generalized) (M62.81);Difficulty in walking, not elsewhere classified (R26.2);Pain Pain - part of body:  (abdomen)    Time: 7846-9629 PT Time Calculation (min) (ACUTE ONLY): 30 min   Charges:   PT Evaluation $PT Eval Moderate Complexity: 1 Mod PT Treatments $Gait Training: 8-22 mins        Lillia Pauls, PT, DPT Acute Rehabilitation Services Pager [phone] Office  [phone]   [name] 02/27/2021, 3:36 PM

## 2021-02-27 NOTE — Progress Notes (Addendum)
Progress Note  Patient Name: Travis Gallagher Date of Encounter: 02/27/2021  Primary Cardiologist: Seen by Dr. Eden Emms in consult while at [facility], visiting from [place]  Subjective   See consult note from [facility] 02/24/21 which carries over into this admission. Patient seen/examined with [employer]. Daughter [name] at bedside. States he feels well from heart standpoint with no CP or SOB. Still has some right sided abdominal pain. Plans to return to [place] 04/08/21.  Per d/w nurse, O2 sat 87-88% RA this AM (has intermittently required additional O2 this admission) so placed on nasal cannula O2. Patient has no respiratory complaints.  Inpatient Medications    Scheduled Meds: . Chlorhexidine Gluconate Cloth  6 each Topical Daily  . mupirocin ointment  1 application Nasal BID  . pantoprazole (PROTONIX) IV  40 mg Intravenous Q24H   Continuous Infusions: . cefTRIAXone (ROCEPHIN)  IV 2 g (02/26/21 2038)  . lactated ringers 50 mL/hr at 02/26/21 1132  . metronidazole 500 mg (02/27/21 0601)   PRN Meds: acetaminophen **OR** acetaminophen, hydrALAZINE, morphine injection, ondansetron (ZOFRAN) IV, oxyCODONE   Vital Signs    Vitals:   02/26/21 1546 02/26/21 1749 02/26/21 2045 02/27/21 0604  BP: (!) 186/75 (!) 168/89 (!) 159/83 (!) 155/78  Pulse: 86 91 91 92  Resp: 15 20 17 17   Temp: 98.1 F (36.7 C) 98.8 F (37.1 C) 98.4 F (36.9 C) 98.2 F (36.8 C)  TempSrc:  Oral    SpO2: 90% 92% 91% (!) 88%  Weight:    69.5 kg    Intake/Output Summary (Last 24 hours) at 02/27/2021 04/29/2021 Last data filed at 02/27/2021 0300 Gross per 24 hour  Intake 900 ml  Output 310 ml  Net 590 ml   Last 3 Weights 02/27/2021 02/25/2021 02/21/2021  Weight (lbs) 153 lb 3.5 oz 141 lb 1.5 oz 140 lb  Weight (kg) 69.5 kg 64 kg 63.504 kg     Telemetry    NSR with brief bursts of narrow complex tachycardia - Personally Reviewed  Physical Exam   GEN: No acute distress.  HEENT:  Normocephalic, atraumatic, sclera non-icteric. Neck: No JVD or bruits. Cardiac: RRR no murmurs, rubs, or gallops.  Respiratory: Clear to auscultation bilaterally. Breathing is unlabored. GI: Soft, nontender, BS +x 4, drain in place. MS: no deformity. Extremities: No clubbing or cyanosis. No edema. Distal pedal pulses are 2+ and equal bilaterally. Neuro:  AAOx3. Follows commands. Psych:  Responds to questions appropriately with a normal affect.  Labs    High Sensitivity Troponin:   Recent Labs  Lab 02/21/21 2141 02/22/21 1449 02/22/21 1959 02/24/21 1034 02/24/21 1201  TROPONINIHS 32* 174* 112* 45* 41*      Cardiac EnzymesNo results for input(s): TROPONINI in the last 168 hours. No results for input(s): TROPIPOC in the last 168 hours.   Chemistry Recent Labs  Lab 02/23/21 0126 02/24/21 0455 02/25/21 0052 02/27/21 0048  NA 138 136  --  140  K 3.6 3.2* 3.6 3.9  CL 106 106  --  109  CO2 20* 23  --  22  GLUCOSE 75 104*  --  141*  BUN 23 18  --  20  CREATININE 1.27* 1.07  --  1.11  CALCIUM 8.2* 8.1*  --  7.7*  PROT 6.2* 5.5*  --  5.5*  ALBUMIN 2.6* 2.1*  --  2.0*  AST 66* 30  --  54*  ALT 95* 56*  --  39  ALKPHOS 169* 137*  --  150*  BILITOT 2.6* 1.2  --  1.0  GFRNONAA 55* >60  --  >60  ANIONGAP 12 7  --  9     Hematology Recent Labs  Lab 02/23/21 0126 02/24/21 0455 02/27/21 0048  WBC 7.6 9.5 8.4  RBC 4.28 3.99* 4.41  HGB 12.7* 12.1* 12.8*  HCT 38.1* 34.5* 38.1*  MCV 89.0 86.5 86.4  MCH 29.7 30.3 29.0  MCHC 33.3 35.1 33.6  RDW 13.0 12.7 13.0  PLT 89* 80* 149*    BNPNo results for input(s): BNP, PROBNP in the last 168 hours.   DDimer No results for input(s): DDIMER in the last 168 hours.   Radiology    No results found.  Cardiac Studies   2D echo 02/24/21 1. Diffuse hypokinesis abnromal septal motion worse in inferior basal  wall. Left ventricular ejection fraction, by estimation, is 45 to 50%. The  left ventricle has mildly decreased function.  The left ventricle  demonstrates global hypokinesis. There is  moderate left ventricular hypertrophy. Left ventricular diastolic  parameters are consistent with Grade I diastolic dysfunction (impaired  relaxation).  2. Right ventricular systolic function is normal. The right ventricular  size is normal. There is mildly elevated pulmonary artery systolic  pressure.  3. The pericardial effusion is posterior to the left ventricle.  4. The mitral valve is abnormal. Mild to moderate mitral valve  regurgitation. No evidence of mitral stenosis.  5. The aortic valve is tricuspid. There is moderate calcification of the  aortic valve. There is moderate thickening of the aortic valve. Aortic  valve regurgitation is mild. Mild to moderate aortic valve  sclerosis/calcification is present, without any  evidence of aortic stenosis.  6. Aortic dilatation noted. There is mild dilatation of the ascending  aorta, measuring 40 mm.  7. The inferior vena cava is normal in size with greater than 50%  respiratory variability, suggesting right atrial pressure of 3 mmHg.   Patient Profile     87 y.o. male with HTN and GERD visiting from [place] (no prior cardiac history) admitted with gallstone pancreatitis, underwent laparoscopic cholecystectomy with gangrenous cholecystitis. Other issues include severe sepsis with enterobacter and E Coli bacteremia felt related to above issues, elevated troponin felt 2/2 demand ischemia, brief PSVT and EF 45-50% by echo.  Assessment & Plan    1. Sepsis with bacteremia, biliary pancreatitis - went to OR 5/1 for laparoscopic cholecystectomy - asked nurse to make primary team aware of fluctuating O2. He has periodically required oxygen this admission - recommend further medical treatment per medical teams. Of note, CTA 4/26 showed emphysema  2. Elevated troponin - peak value 174 on 02/22/21, no chest pain reported - suspect 2/2 demand ischemia - anticipate OP f/u to  revisit clinical stability - hold off ASA given recent post-op status  3. PSVT - some irregularity noted but with intermittent P wave activity seen -? Atrial tach -will review with MD - check baseline TSH - consider starting beta blocker  4. LV dysfunction with mild-moderate mitral regurgitation - clinically appears generally euvolemic, consider stress-induced from illness - no anginal symptoms to suggest this is related to acute MI - consider initiation of beta blocker today - if BP then stable consider addition of ARB - would recommend outpatient follow-up - reviewed with patient  5. Essential HTN - follow in context above  6. Transient RBBB - noted in consult note, no bradycardia seen on telemetry  7. Mild dilation of ascending aorta - no intervention needed at present time aside  from BP management, can be followed in outpatient setting of clinically appropriate - I added pertinent cardiology findings to his discharge instructions to show to his clinical team in [place]  For questions or updates, please contact [facility] Please consult [email or web address] for contact info under Cardiology/STEMI.  Signed, Laurann Montana, PA-C 02/27/2021, 9:22 AM    Patient seen and examined with Ronie Spies PA-C.  Agree as above, with the following exceptions and changes as noted below. No CP or SOB. Agree as above, troponin elevation likely secondary to demand. Titrate medical therapy as tolerated. Gen: NAD, CV: RRR, no murmurs, Lungs: clear, Abd: soft, Extrem: Warm, well perfused, no edema, Neuro/Psych: alert and oriented x 3, normal mood and affect. All available labs, radiology testing, previous records reviewed. Visiting from [place], plans to return. Can potentially get TOC follow up, otherwise patient will need to establish when home.   Parke Poisson, MD

## 2021-02-28 ENCOUNTER — Inpatient Hospital Stay (HOSPITAL_COMMUNITY): Payer: Self-pay

## 2021-02-28 DIAGNOSIS — N179 Acute kidney failure, unspecified: Secondary | ICD-10-CM

## 2021-02-28 LAB — CBC
HCT: 37.4 % — ABNORMAL LOW (ref 39.0–52.0)
Hemoglobin: 12.5 g/dL — ABNORMAL LOW (ref 13.0–17.0)
MCH: 29.3 pg (ref 26.0–34.0)
MCHC: 33.4 g/dL (ref 30.0–36.0)
MCV: 87.6 fL (ref 80.0–100.0)
Platelets: 177 10*3/uL (ref 150–400)
RBC: 4.27 MIL/uL (ref 4.22–5.81)
RDW: 13.3 % (ref 11.5–15.5)
WBC: 11.9 10*3/uL — ABNORMAL HIGH (ref 4.0–10.5)
nRBC: 0 % (ref 0.0–0.2)

## 2021-02-28 LAB — TSH: TSH: 0.039 u[IU]/mL — ABNORMAL LOW (ref 0.350–4.500)

## 2021-02-28 LAB — BASIC METABOLIC PANEL
Anion gap: 9 (ref 5–15)
BUN: 36 mg/dL — ABNORMAL HIGH (ref 8–23)
CO2: 21 mmol/L — ABNORMAL LOW (ref 22–32)
Calcium: 7.3 mg/dL — ABNORMAL LOW (ref 8.9–10.3)
Chloride: 108 mmol/L (ref 98–111)
Creatinine, Ser: 1.45 mg/dL — ABNORMAL HIGH (ref 0.61–1.24)
GFR, Estimated: 47 mL/min — ABNORMAL LOW (ref >60)
Glucose, Bld: 142 mg/dL — ABNORMAL HIGH (ref 70–99)
Potassium: 3.6 mmol/L (ref 3.5–5.1)
Sodium: 138 mmol/L (ref 135–145)

## 2021-02-28 LAB — SURGICAL PATHOLOGY

## 2021-02-28 LAB — T4, FREE: Free T4: 2.74 ng/dL — ABNORMAL HIGH (ref 0.61–1.12)

## 2021-02-28 MED ORDER — DOCUSATE SODIUM 100 MG PO CAPS
100.0000 mg | ORAL_CAPSULE | Freq: Two times a day (BID) | ORAL | Status: DC
  Administered 2021-02-28 – 2021-03-01 (×3): 100 mg via ORAL
  Filled 2021-02-28 (×3): qty 1

## 2021-02-28 MED ORDER — BISACODYL 10 MG RE SUPP
10.0000 mg | Freq: Once | RECTAL | Status: AC
  Administered 2021-02-28: 10 mg via RECTAL
  Filled 2021-02-28: qty 1

## 2021-02-28 MED ORDER — PANTOPRAZOLE SODIUM 40 MG PO TBEC
40.0000 mg | DELAYED_RELEASE_TABLET | Freq: Every day | ORAL | Status: DC
  Administered 2021-02-28 – 2021-03-01 (×2): 40 mg via ORAL
  Filled 2021-02-28 (×2): qty 1

## 2021-02-28 MED ORDER — METHOCARBAMOL 1000 MG/10ML IJ SOLN
500.0000 mg | Freq: Three times a day (TID) | INTRAVENOUS | Status: DC
  Administered 2021-02-28 – 2021-03-01 (×4): 500 mg via INTRAVENOUS
  Filled 2021-02-28: qty 500
  Filled 2021-02-28 (×5): qty 5

## 2021-02-28 MED ORDER — OXYCODONE HCL 5 MG PO TABS
5.0000 mg | ORAL_TABLET | ORAL | Status: DC | PRN
  Administered 2021-03-01: 5 mg via ORAL
  Filled 2021-02-28: qty 1

## 2021-02-28 MED ORDER — POLYETHYLENE GLYCOL 3350 17 G PO PACK
17.0000 g | PACK | Freq: Every day | ORAL | Status: DC
  Administered 2021-02-28 – 2021-03-01 (×2): 17 g via ORAL
  Filled 2021-02-28 (×2): qty 1

## 2021-02-28 MED ORDER — METOPROLOL TARTRATE 12.5 MG HALF TABLET
12.5000 mg | ORAL_TABLET | Freq: Two times a day (BID) | ORAL | Status: DC
  Administered 2021-02-28 – 2021-03-01 (×2): 12.5 mg via ORAL
  Filled 2021-02-28 (×3): qty 1

## 2021-02-28 NOTE — Progress Notes (Signed)
Physical Therapy Treatment Patient Details Name: Travis Gallagher MRN: [id number] DOB: October 30, 1930 Today's Date: 02/28/2021    History of Present Illness Pt is a 87 y.o. M admitted with gallstone pancreatitis, underwent laparoscopic cholecystectomy with gangrenous cholecystitis. Also found to have LV dysfunction with mild-moderate mitral regurgitation, PSVT, elevated troponin. Significant PMH: HTN, GERD, Visiting from [place]. (Simultaneous filing. User may not have seen previous data.)    PT Comments    Pt seated edge of bed on arrival this session with OT present.  Pt required increased assistance to move to edge of bed due to abdominal pain.  Pt required max VCs to progress gt training this session.  He presents with mild DOE and SPO2 88%-91% on RA.  HR 105 bpm.  Continue to recommend HHPT at this time and DME listed below.     Follow Up Recommendations  Home health PT;Supervision for mobility/OOB     Equipment Recommendations  Rolling walker with 5" wheels;3in1 (PT)    Recommendations for Other Services       Precautions / Restrictions Precautions Precautions: Fall (Simultaneous filing. User may not have seen previous data.) Restrictions Weight Bearing Restrictions: No (Simultaneous filing. User may not have seen previous data.)    Mobility  Bed Mobility               General bed mobility comments: Seated edge of bed on arrival.    Transfers Overall transfer level: Needs assistance Equipment used: Rolling walker (2 wheeled) Transfers: Sit to/from Stand Sit to Stand: Mod assist;+2 safety/equipment         General transfer comment: Cues for hand placement to and from seated surface.  Pt required assistance to boost into standing.  Cues for trunk extension and head control.  Ambulation/Gait Ambulation/Gait assistance: Min assist;Mod assist;+2 safety/equipment (mod assistance intially then min assistance as gt progressed.) Gait Distance (Feet): 45  Feet Assistive device: Rolling walker (2 wheeled) Gait Pattern/deviations: Step-through pattern;Decreased stride length;Trunk flexed Gait velocity: decreased   General Gait Details: Cues for upper trunk control and increasing B stride length with gt training.  Close chair follow for safety but did not require.  Pt fatigues quickly with minor knee instability noted.   Stairs             Wheelchair Mobility    Modified Rankin (Stroke Patients Only)       Balance Overall balance assessment: Needs assistance Sitting-balance support: Feet supported Sitting balance-Leahy Scale: Good     Standing balance support: Bilateral upper extremity supported Standing balance-Leahy Scale: Poor Standing balance comment: reliant on external support                            Cognition Arousal/Alertness: Awake/alert Behavior During Therapy: WFL for tasks assessed/performed Overall Cognitive Status: Within Functional Limits for tasks assessed                                        Exercises General Exercises - Lower Extremity Short Arc Quad: AROM;Both;10 reps;Seated Other Exercises Other Exercises: Cervial extension and flexion x 10 reps in sitting. Other Exercises: B shoulder elevation in sitting x 10 reps. Other Exercises: Attempted scap retraction but unable to follow commands to complete exercises.    General Comments General comments (skin integrity, edema, etc.): VSS on RA, easily fatigued      Pertinent Vitals/Pain Pain  Assessment: Faces (Simultaneous filing. User may not have seen previous data.) Faces Pain Scale: Hurts even more (Simultaneous filing. User may not have seen previous data.) Pain Location: mid to left lower abdomen (Simultaneous filing. User may not have seen previous data.) Pain Descriptors / Indicators: Discomfort;Grimacing;Guarding (Simultaneous filing. User may not have seen previous data.) Pain Intervention(s): Monitored  during session;Repositioned (Simultaneous filing. User may not have seen previous data.)    Home Living Family/patient expects to be discharged to:: Private residence Living Arrangements: Children Available Help at Discharge: Family Type of Home: House Home Access: Stairs to enter   Home Layout: Able to live on main level with bedroom/bathroom Home Equipment: None      Prior Function Level of Independence: Independent      Comments: From [place], visiting   PT Goals (current goals can now be found in the care plan section) Acute Rehab PT Goals Patient Stated Goal: get stronger Potential to Achieve Goals: Good Progress towards PT goals: Progressing toward goals    Frequency    Min 3X/week      PT Plan Current plan remains appropriate;Equipment recommendations need to be updated    Co-evaluation PT/OT/SLP Co-Evaluation/Treatment: Yes Reason for Co-Treatment: Complexity of the patient's impairments (multi-system involvement) PT goals addressed during session: Mobility/safety with mobility OT goals addressed during session: ADL's and self-care      AM-PAC PT "6 Clicks" Mobility   Outcome Measure  Help needed turning from your back to your side while in a flat bed without using bedrails?: None Help needed moving from lying on your back to sitting on the side of a flat bed without using bedrails?: A Little Help needed moving to and from a bed to a chair (including a wheelchair)?: A Little Help needed standing up from a chair using your arms (e.g., wheelchair or bedside chair)?: A Little Help needed to walk in hospital room?: A Little Help needed climbing 3-5 steps with a railing? : A Lot 6 Click Score: 18    End of Session Equipment Utilized During Treatment: Gait belt Activity Tolerance: Patient tolerated treatment well Patient left: in chair;with call bell/phone within reach;with chair alarm set;with family/visitor present Nurse Communication: Mobility status PT  Visit Diagnosis: Unsteadiness on feet (R26.81);Muscle weakness (generalized) (M62.81);Difficulty in walking, not elsewhere classified (R26.2);Pain Pain - part of body:  (abdomen)     Time: 0093-8182 PT Time Calculation (min) (ACUTE ONLY): 20 min  Charges:  $Gait Training: 8-22 mins                     Bonney Leitz , PTA Acute Rehabilitation Services Pager [phone] Office [phone]     Travis Gallagher Artis Delay 02/28/2021, 4:26 PM

## 2021-02-28 NOTE — Progress Notes (Addendum)
Progress Note  Patient Name: Travis Gallagher Date of Encounter: 02/28/2021  Primary Cardiologist: Seen by Dr. Eden Emms in consult while at [facility], visiting from [place]  Subjective   Patient seen/examined with [employer]. Daughter in law* [name] at bedside. No CP or SOB. Just complains of continued abdominal pain, mostly RUQ but also extends to his whole abdomen.   Inpatient Medications    Scheduled Meds: . bisacodyl  5 mg Oral Daily  . carvedilol  3.125 mg Oral BID WC  . Chlorhexidine Gluconate Cloth  6 each Topical Daily  . mupirocin ointment  1 application Nasal BID  . pantoprazole (PROTONIX) IV  40 mg Intravenous Q24H   Continuous Infusions: . cefTRIAXone (ROCEPHIN)  IV 2 g (02/27/21 2105)  . lactated ringers 50 mL/hr at 02/28/21 0516  . metronidazole 500 mg (02/28/21 0700)   PRN Meds: acetaminophen **OR** acetaminophen, hydrALAZINE, melatonin, morphine injection, ondansetron (ZOFRAN) IV, oxyCODONE   Vital Signs    Vitals:   02/28/21 0020 02/28/21 0220 02/28/21 0517 02/28/21 0806  BP:  117/69 (!) 117/55 (!) 109/54  Pulse:  90 91 95  Resp: 19 16 15 15   Temp:  98.3 F (36.8 C) 99 F (37.2 C) 98.5 F (36.9 C)  TempSrc:  Oral Oral Oral  SpO2:  92% 94% 90%  Weight:        Intake/Output Summary (Last 24 hours) at 02/28/2021 0853 Last data filed at 02/28/2021 0749 Gross per 24 hour  Intake 3002.5 ml  Output 515 ml  Net 2487.5 ml   Last 3 Weights 02/27/2021 02/25/2021 02/21/2021  Weight (lbs) 153 lb 3.5 oz 141 lb 1.5 oz 140 lb  Weight (kg) 69.5 kg 64 kg 63.504 kg     Telemetry    NSR, occasional PVC, no further SVT - Personally Reviewed  Physical Exam   GEN: No acute distress. Frail elderly M. HEENT: Normocephalic, atraumatic, sclera non-icteric. Neck: No JVD or bruits. Cardiac: RRR no murmurs, rubs, or gallops.  Respiratory: Clear to auscultation bilaterally anteriorly. Patient declines to sit forward as this causes his abdomen to  hurt. Breathing is unlabored. GI: Soft, non-distended, BS +x 4. Pt reports generalized pain MS: no deformity. Extremities: No clubbing or cyanosis. No edema. Distal pedal pulses are 2+ and equal bilaterally. Neuro:  AAOx3. Follows commands. Psych:  Responds to questions appropriately with a normal affect.  Labs    High Sensitivity Troponin:   Recent Labs  Lab 02/21/21 2141 02/22/21 1449 02/22/21 1959 02/24/21 1034 02/24/21 1201  TROPONINIHS 32* 174* 112* 45* 41*      Cardiac EnzymesNo results for input(s): TROPONINI in the last 168 hours. No results for input(s): TROPIPOC in the last 168 hours.   Chemistry Recent Labs  Lab 02/23/21 0126 02/24/21 0455 02/25/21 0052 02/27/21 0048 02/28/21 0215  NA 138 136  --  140 138  K 3.6 3.2* 3.6 3.9 3.6  CL 106 106  --  109 108  CO2 20* 23  --  22 21*  GLUCOSE 75 104*  --  141* 142*  BUN 23 18  --  20 36*  CREATININE 1.27* 1.07  --  1.11 1.45*  CALCIUM 8.2* 8.1*  --  7.7* 7.3*  PROT 6.2* 5.5*  --  5.5*  --   ALBUMIN 2.6* 2.1*  --  2.0*  --   AST 66* 30  --  54*  --   ALT 95* 56*  --  39  --   ALKPHOS 169* 137*  --  150*  --   BILITOT 2.6* 1.2  --  1.0  --   GFRNONAA 55* >60  --  >60 47*  ANIONGAP 12 7  --  9 9     Hematology Recent Labs  Lab 02/24/21 0455 02/27/21 0048 02/28/21 0215  WBC 9.5 8.4 11.9*  RBC 3.99* 4.41 4.27  HGB 12.1* 12.8* 12.5*  HCT 34.5* 38.1* 37.4*  MCV 86.5 86.4 87.6  MCH 30.3 29.0 29.3  MCHC 35.1 33.6 33.4  RDW 12.7 13.0 13.3  PLT 80* 149* 177    BNPNo results for input(s): BNP, PROBNP in the last 168 hours.   DDimer No results for input(s): DDIMER in the last 168 hours.   Radiology    No results found.  Cardiac Studies   2D echo 02/24/21 1. Diffuse hypokinesis abnromal septal motion worse in inferior basal  wall. Left ventricular ejection fraction, by estimation, is 45 to 50%. The  left ventricle has mildly decreased function. The left ventricle  demonstrates global hypokinesis.  There is  moderate left ventricular hypertrophy. Left ventricular diastolic  parameters are consistent with Grade I diastolic dysfunction (impaired  relaxation).  2. Right ventricular systolic function is normal. The right ventricular  size is normal. There is mildly elevated pulmonary artery systolic  pressure.  3. The pericardial effusion is posterior to the left ventricle.  4. The mitral valve is abnormal. Mild to moderate mitral valve  regurgitation. No evidence of mitral stenosis.  5. The aortic valve is tricuspid. There is moderate calcification of the  aortic valve. There is moderate thickening of the aortic valve. Aortic  valve regurgitation is mild. Mild to moderate aortic valve  sclerosis/calcification is present, without any  evidence of aortic stenosis.  6. Aortic dilatation noted. There is mild dilatation of the ascending  aorta, measuring 40 mm.  7. The inferior vena cava is normal in size with greater than 50%  respiratory variability, suggesting right atrial pressure of 3 mmHg.   Patient Profile     87 y.o. male with HTN and GERD visiting from [place] (no prior cardiac history) admitted with gallstone pancreatitis, underwent laparoscopic cholecystectomy with gangrenous cholecystitis. Other issues include severe sepsis with enterobacter and E Coli bacteremia felt related to above issues, elevated troponin felt 2/2 demand ischemia, brief PSVT and EF 45-50% by echo.  Patient plans to return to [place] 04/08/21.  Assessment & Plan    1. Sepsis with bacteremia, biliary pancreatitis, also with hypoxia requiring supplemental O2 - went to OR 5/1 for laparoscopic cholecystectomy, still with episodic abdominal pain - requiring supplemental O2 this admission periodically - recommend further medical treatment per medical teams. Of note, CTA 4/26 showed emphysema. Volume status looks OK with no edema and normal BS anteriorly but patient declines to participate in posterior  lung exam  2. Elevated troponin - peak value 174 on 02/22/21, no chest pain reported - suspect 2/2 demand ischemia - anticipate OP f/u to revisit clinical stability - hold off ASA given recent post-op status  3. PSVT - carvedilol started yesterday by IM but BP downtrending to 109-117 systolic so this was held by nursing - will switch to low dose Lopressor, can consolidate to Toprol at discharge if tolerates well  4. LV dysfunction with mild-moderate mitral regurgitation - clinically appears generally euvolemic, consider stress-induced from illness - no anginal symptoms to suggest this is related to acute MI - started beta blocker as above - if BP then stable consider addition of ARB as outpatient -  would recommend outpatient follow-up - reviewed with patient, placed info on AVS for him to show to provider in [place]  5. Essential HTN - follow in context above  6. Transient RBBB - noted in consult note, no bradycardia seen on telemetry  7. Mild dilation of ascending aorta - no intervention needed at present time aside from BP management, can be followed in outpatient setting of clinically appropriate - I added pertinent cardiology findings to his discharge instructions to show to his clinical team in [place]  8. Abnormal thyroid function - TSH checked due to PSVT/HTN, found to have suppressed TSH and elevated free T4 - will defer further management of suspected hyperthyroidism to internal medicine team  9. AKI - BUN/Cr up this AM, further per IM    For questions or updates, please contact [facility] Please consult [email or web address] for contact info under Cardiology/STEMI.  Signed, Laurann Montana, PA-C 02/28/2021, 8:53 AM    Patient seen and examined with Ronie Spies PA-C.  Agree as above, with the following exceptions and changes as noted below. Gen: NAD, CV: RRR, no murmurs, Lungs: clear, Abd: soft, Extrem: Warm, well perfused, no edema, Neuro/Psych: alert and oriented x 3,  normal mood and affect. All available labs, radiology testing, previous records reviewed. Agree as above with use of lopressor as tolerated. Recommend outpatient cardiology follow up in [place], or if he remains in the states, we will be happy to see him.   At this time, cardiology will sign off. Please contact our service if we can assist in any way prior to hospital discharge.  Parke Poisson, MD 02/28/21 5:49 PM

## 2021-02-28 NOTE — Anesthesia Postprocedure Evaluation (Signed)
Anesthesia Post Note  Patient: Travis Gallagher  Procedure(s) Performed: LAPAROSCOPIC CHOLECYSTECTOMY (N/A Abdomen)     Patient location during evaluation: PACU Anesthesia Type: General Level of consciousness: awake and alert Pain management: pain level controlled Vital Signs Assessment: post-procedure vital signs reviewed and stable Respiratory status: spontaneous breathing, nonlabored ventilation, respiratory function stable and patient connected to nasal cannula oxygen Cardiovascular status: blood pressure returned to baseline and stable Postop Assessment: no apparent nausea or vomiting Anesthetic complications: no   No complications documented.              Shelton Silvas

## 2021-02-28 NOTE — Progress Notes (Signed)
Progress Note  2 Days Post-Op  Subjective: Patient reports abdominal pain that starts low and works its way around. No flatus or BM. He is not nauseated but belching. He has walked some but reports pain is inhibiting him some. Family member at bedside.   Objective: Vital signs in last 24 hours: Temp:  [97.6 F (36.4 C)-99 F (37.2 C)] 98.5 F (36.9 C) (05/03 0806) Pulse Rate:  [88-95] 95 (05/03 0806) Resp:  [15-22] 15 (05/03 0806) BP: (109-136)/(54-69) 109/54 (05/03 0806) SpO2:  [90 %-94 %] 90 % (05/03 0806) Last BM Date: 02/25/21  Intake/Output from previous day: 05/02 0701 - 05/03 0700 In: 2762.5 [P.O.:20; I.V.:2042.5; IV Piggyback:700] Out: 565 [Urine:550; Drains:15] Intake/Output this shift: Total I/O In: 240 [P.O.:240] Out: 200 [Urine:200]  PE: General: pleasant, male who is laying in bed in NAD Heart: regular, rate, and rhythm.   Lungs: CTAB, no wheezes, rhonchi, or rales noted.  Respiratory effort nonlabored on supplemental O2 Abd: BS present, distended, appropriately ttp, incisions c/d/i, drain with ss fluid MS: all 4 extremities are symmetrical with no cyanosis, clubbing, or edema. Skin: warm and dry with no masses, lesions, or rashes Neuro: Cranial nerves 2-12 grossly intact, sensation is normal throughout Psych: A&Ox3 with an appropriate affect.    Lab Results:  Recent Labs    02/27/21 0048 02/28/21 0215  WBC 8.4 11.9*  HGB 12.8* 12.5*  HCT 38.1* 37.4*  PLT 149* 177   BMET Recent Labs    02/27/21 0048 02/28/21 0215  NA 140 138  K 3.9 3.6  CL 109 108  CO2 22 21*  GLUCOSE 141* 142*  BUN 20 36*  CREATININE 1.11 1.45*  CALCIUM 7.7* 7.3*   PT/INR No results for input(s): LABPROT, INR in the last 72 hours. CMP     Component Value Date/Time   NA 138 02/28/2021 0215   K 3.6 02/28/2021 0215   CL 108 02/28/2021 0215   CO2 21 (L) 02/28/2021 0215   GLUCOSE 142 (H) 02/28/2021 0215   BUN 36 (H) 02/28/2021 0215   CREATININE 1.45 (H) 02/28/2021  0215   CALCIUM 7.3 (L) 02/28/2021 0215   PROT 5.5 (L) 02/27/2021 0048   ALBUMIN 2.0 (L) 02/27/2021 0048   AST 54 (H) 02/27/2021 0048   ALT 39 02/27/2021 0048   ALKPHOS 150 (H) 02/27/2021 0048   BILITOT 1.0 02/27/2021 0048   GFRNONAA 47 (L) 02/28/2021 0215   Lipase     Component Value Date/Time   LIPASE 92 (H) 02/25/2021 0052       Studies/Results: No results found.  Anti-infectives: Anti-infectives (From admission, onward)   Start     Dose/Rate Route Frequency Ordered Stop   02/26/21 1500  metroNIDAZOLE (FLAGYL) IVPB 500 mg        500 mg 100 mL/hr over 60 Minutes Intravenous Every 8 hours 02/26/21 1414     02/22/21 2000  cefTRIAXone (ROCEPHIN) 2 g in sodium chloride 0.9 % 100 mL IVPB        2 g 200 mL/hr over 30 Minutes Intravenous Daily at 10 pm 02/22/21 1339     02/22/21 1600  metroNIDAZOLE (FLAGYL) IVPB 500 mg  Status:  Discontinued        500 mg 100 mL/hr over 60 Minutes Intravenous Every 8 hours 02/22/21 1339 02/26/21 1414       Assessment/Plan Elevated troponin- 32 >174 >112, EKG without ischemia changes,cards cleared for surgery E.coli, Enterobacterales bacteremia - IV abx per primary team   Acute biliary pancreatitis,  suspect resolving ascending cholangitis, gangrenous cholecystitis  s/p lap chole with Dr. Dwain Sarna on 5/1 - POD#2 - operative findings of liver cysts and cirrhosis - will need PCP/GI follow up  - appears to have mild ileus - need to mobilize, CLD, KUB, try suppository today  - monitor JP drain - 15 cc recorded out in last 24 hr   Interpretor services were used for the duration of interview and PE  FEN: CLD, IVF ID: ceftriaxone, flagyl VTE: SCDs, ok to have chemical prophylaxis from a surgical standpoint   LOS: 6 days    Juliet Rude, [facility] [place] [facility] 02/28/2021, 12:12 PM Please see Amion for pager number during day hours 7:00am-4:30pm

## 2021-02-28 NOTE — Evaluation (Signed)
Occupational Therapy Evaluation Patient Details Name: Travis Gallagher MRN: [id number] DOB: [birth year] Today's Date: 02/28/2021    History of Present Illness Pt is a 87 y.o. M admitted with gallstone pancreatitis, underwent laparoscopic cholecystectomy with gangrenous cholecystitis. Also found to have LV dysfunction with mild-moderate mitral regurgitation, PSVT, elevated troponin. Significant PMH: HTN, GERD, Visiting from [place]. (Simultaneous filing. User may not have seen previous data.)   Clinical Impression   Pt admitted to the ED for concerns listed above. PTA pt was visiting family here, pt is originally from [place]. Pt and family reported that pt is independent in all ADL's and IADL's, he enjoys going for walks, and is very active at baseline. At the time of the evaluation pt was limited by pain and distention in his abdomen. Pt required min assist for most functional mobility using a RW, min guard for seated UE ADL's and mod assist for LE ADL's. Pt balance and activity tolerance are affected at this time, and pt/family reports that he is much weaker than prior to hospitalization. OT will continue to follow to assist pt in progressing functional mobility and ADL performance.   Graciella, the on site spanish interpretor participated in this evaluation.     Follow Up Recommendations  Home health OT    Equipment Recommendations  3 in 1 bedside commode;Other (comment) (RW)    Recommendations for Other Services       Precautions / Restrictions Precautions Precautions: Fall (Simultaneous filing. User may not have seen previous data.) Restrictions Weight Bearing Restrictions: No (Simultaneous filing. User may not have seen previous data.)      Mobility Bed Mobility               General bed mobility comments: Seated edge of bed on arrival.    Transfers Overall transfer level: Needs assistance Equipment used: Rolling walker (2 wheeled) Transfers: Sit to/from  Stand Sit to Stand: Mod assist;+2 safety/equipment         General transfer comment: Cues for hand placement to and from seated surface.  Pt required assistance to boost into standing.  Cues for trunk extension and head control.    Balance Overall balance assessment: Needs assistance Sitting-balance support: Feet supported Sitting balance-Leahy Scale: Good     Standing balance support: Bilateral upper extremity supported Standing balance-Leahy Scale: Poor Standing balance comment: reliant on external support                           ADL either performed or assessed with clinical judgement   ADL Overall ADL's : Needs assistance/impaired Eating/Feeding: Set up;Sitting Eating/Feeding Details (indicate cue type and reason): can feed himself... DIL reports feeding him while in hospital due to pain and fatigue. Grooming: Wash/dry hands;Wash/dry face;Oral care;Standing Grooming Details (indicate cue type and reason): completed at sink Upper Body Bathing: Min guard;Sitting Upper Body Bathing Details (indicate cue type and reason): trunk control weak at this time due to pain, min guard for safety Lower Body Bathing: Moderate assistance;Sitting/lateral leans;Sit to/from stand Lower Body Bathing Details (indicate cue type and reason): Pt having difficulties reaching feet and completing pericare due to activity tolerance and balance deficits. Upper Body Dressing : Min guard;Sitting Upper Body Dressing Details (indicate cue type and reason): Trunk control weak, needs min guard for safety. Lower Body Dressing: Moderate assistance;Sitting/lateral leans;Sit to/from stand Lower Body Dressing Details (indicate cue type and reason): Pt unable to reach his feet at this time to doff and don socks  and underwear Toilet Transfer: Minimal assistance;Ambulation;Regular Toilet;RW Toilet Transfer Details (indicate cue type and reason): simulated with transfer from chair to bed.          Functional mobility during ADLs: Minimal assistance;Rolling walker;Min guard General ADL Comments: Initially pt required min assist for mobility, once moving, pt was able to walk with no assist, min guard for safety. Pt needs encouragement to complete ADL's for himself, relies on DIL completeing most tasks for him     Vision         Perception Perception Perception Tested?: No   Praxis Praxis Praxis tested?: Not tested    Pertinent Vitals/Pain Pain Assessment: Faces (Simultaneous filing. User may not have seen previous data.) Faces Pain Scale: Hurts even more (Simultaneous filing. User may not have seen previous data.) Pain Location: mid to left lower abdomen (Simultaneous filing. User may not have seen previous data.) Pain Descriptors / Indicators: Discomfort;Grimacing;Guarding (Simultaneous filing. User may not have seen previous data.) Pain Intervention(s): Monitored during session;Repositioned (Simultaneous filing. User may not have seen previous data.)     Hand Dominance Right   Extremity/Trunk Assessment Upper Extremity Assessment Upper Extremity Assessment: Generalized weakness   Lower Extremity Assessment Lower Extremity Assessment: Defer to PT evaluation   Cervical / Trunk Assessment Cervical / Trunk Assessment: Normal   Communication Communication Communication: Prefers language other than English   Cognition Arousal/Alertness: Awake/alert Behavior During Therapy: WFL for tasks assessed/performed Overall Cognitive Status: Within Functional Limits for tasks assessed                                     General Comments  VSS on RA, easily fatigued    Exercises General Exercises - Lower Extremity Short Arc Quad: AROM;Both;10 reps;Seated Other Exercises Other Exercises: Cervial extension and flexion x 10 reps in sitting. Other Exercises: B shoulder elevation in sitting x 10 reps. Other Exercises: Attempted scap retraction but unable to follow  commands to complete exercises.   Shoulder Instructions      Home Living Family/patient expects to be discharged to:: Private residence Living Arrangements: Children Available Help at Discharge: Family Type of Home: House Home Access: Stairs to enter Secretary/administrator of Steps: 3   Home Layout: Able to live on main level with bedroom/bathroom     Bathroom Shower/Tub: Chief Strategy Officer: Standard Bathroom Accessibility: Yes How Accessible: Accessible via walker Home Equipment: None          Prior Functioning/Environment Level of Independence: Independent        Comments: From [place], visiting        OT Problem List: Decreased strength;Decreased activity tolerance;Impaired balance (sitting and/or standing);Decreased coordination;Decreased safety awareness;Decreased knowledge of use of DME or AE;Pain      OT Treatment/Interventions: Self-care/ADL training;Therapeutic exercise;Energy conservation;DME and/or AE instruction;Therapeutic activities;Patient/family education;Balance training    OT Goals(Current goals can be found in the care plan section) Acute Rehab OT Goals Patient Stated Goal: get stronger OT Goal Formulation: With patient/family Time For Goal Achievement: 02/28/21 Potential to Achieve Goals: Good ADL Goals Pt Will Perform Grooming: with supervision;standing Pt Will Perform Upper Body Dressing: with modified independence;sitting Pt Will Perform Lower Body Dressing: with modified independence;with adaptive equipment;sit to/from stand;sitting/lateral leans Pt Will Transfer to Toilet: with modified independence;ambulating;regular height toilet Additional ADL Goal #1: Pt will tolerate standing for 5 mins to complete self care activities at the sink  OT Frequency: Min 2X/week  Barriers to D/C:            Co-evaluation   Reason for Co-Treatment: Complexity of the patient's impairments (multi-system involvement) PT goals addressed  during session: Mobility/safety with mobility OT goals addressed during session: ADL's and self-care      AM-PAC OT "6 Clicks" Daily Activity     Outcome Measure Help from another person eating meals?: A Little Help from another person taking care of personal grooming?: A Little Help from another person toileting, which includes using toliet, bedpan, or urinal?: A Little Help from another person bathing (including washing, rinsing, drying)?: A Lot Help from another person to put on and taking off regular upper body clothing?: A Little Help from another person to put on and taking off regular lower body clothing?: A Lot 6 Click Score: 16   End of Session Equipment Utilized During Treatment: Rolling walker Nurse Communication: Mobility status  Activity Tolerance: Patient limited by fatigue;Patient limited by pain Patient left: in chair;with call bell/phone within reach;with chair alarm set  OT Visit Diagnosis: Unsteadiness on feet (R26.81);Other abnormalities of gait and mobility (R26.89);Muscle weakness (generalized) (M62.81)                Time: 0109-3235 OT Time Calculation (min): 31 min Charges:  OT General Charges $OT Visit: 1 Visit OT Evaluation $OT Eval Moderate Complexity: 1 Mod  Aydden Cumpian H., OTR/L Acute Rehabilitation  Kaithlyn Teagle Elane Bing Plume 02/28/2021, 4:27 PM

## 2021-02-28 NOTE — Progress Notes (Signed)
PROGRESS NOTE    Travis Gallagher  HLK:[id number] DOB: 06-16-1931 DOA: 02/22/2021 PCP: Merryl Hacker, No    Brief Narrative:  Travis Gallagher is a 87 y.o. male with medical history significant for hypertension, GERD who is admitted to [facility] by way of transfer from [facility] ED for further evaluation and management of acute pancreatitis in setting of potential choledocholithiasis after presenting from home to [facility] ED complaining of abdominal pain.  He was found with acute biliary pancreatitis, GI initially was consulted.  MRCP does not demonstrate choledocho cholelithiasis.  They felt there was no indication for ERCP.  Ultrasound showed Cholelithiasis. Then Gsx consulted, pt now s/p Lap. Cholecystectomy.  Cardiology was consulted for preop risk stratification which they cleared him for surgery. Echo was completed prior to surgery. He also had few beats of PSVT and was started on beta blk, and cards aware.  5/3 pt eating , but denies flatus or BM. Still c/o abd pain, no n/v   Consultants:   GI, cardiology, general surgery  Procedures:   Antimicrobials:   Ceftriaxone and metronidazole   Subjective: As above   Objective: Vitals:   02/28/21 0020 02/28/21 0220 02/28/21 0517 02/28/21 0806  BP:  117/69 (!) 117/55 (!) 109/54  Pulse:  90 91 95  Resp: '19 16 15 15  ' Temp:  98.3 F (36.8 C) 99 F (37.2 C) 98.5 F (36.9 C)  TempSrc:  Oral Oral Oral  SpO2:  92% 94% 90%  Weight:        Intake/Output Summary (Last 24 hours) at 02/28/2021 0856 Last data filed at 02/28/2021 0749 Gross per 24 hour  Intake 3002.5 ml  Output 515 ml  Net 2487.5 ml   Filed Weights   02/25/21 0541 02/27/21 0604  Weight: 64 kg 69.5 kg    Examination: Nad, calm Decrease bs at bases. No wheezing Regular s1/s2 no gallop Distended abd.ttp, decrease bs, drain in place Mild edema Aaxox3, grossly intact Mood and affect appropriate      Data Reviewed: I have personally reviewed  following labs and imaging studies  CBC: Recent Labs  Lab 02/21/21 2021 02/22/21 1245 02/23/21 0126 02/24/21 0455 02/27/21 0048 02/28/21 0215  WBC 14.7* 9.4 7.6 9.5 8.4 11.9*  NEUTROABS 13.4* 8.4* 6.6  --   --   --   HGB 14.4 12.7* 12.7* 12.1* 12.8* 12.5*  HCT 41.4 38.0* 38.1* 34.5* 38.1* 37.4*  MCV 85.4 89.2 89.0 86.5 86.4 87.6  PLT 136* 95* 89* 80* 149* 734   Basic Metabolic Panel: Recent Labs  Lab 02/22/21 1245 02/23/21 0126 02/24/21 0455 02/25/21 0052 02/27/21 0048 02/28/21 0215  NA 138 138 136  --  140 138  K 3.9 3.6 3.2* 3.6 3.9 3.6  CL 107 106 106  --  109 108  CO2 24 20* 23  --  22 21*  GLUCOSE 90 75 104*  --  141* 142*  BUN '18 23 18  ' --  20 36*  CREATININE 1.25* 1.27* 1.07  --  1.11 1.45*  CALCIUM 8.0* 8.2* 8.1*  --  7.7* 7.3*  MG 1.9 2.0  --   --   --   --    GFR: Estimated Creatinine Clearance: 33.6 mL/min (A) (by C-G formula based on SCr of 1.45 mg/dL (H)). Liver Function Tests: Recent Labs  Lab 02/21/21 2021 02/22/21 1245 02/23/21 0126 02/24/21 0455 02/27/21 0048  AST 210* 102* 66* 30 54*  ALT 185* 125* 95* 56* 39  ALKPHOS 209* 178* 169* 137*  150*  BILITOT 6.3* 4.7* 2.6* 1.2 1.0  PROT 7.7 6.1* 6.2* 5.5* 5.5*  ALBUMIN 3.5 2.8* 2.6* 2.1* 2.0*   Recent Labs  Lab 02/21/21 2021 02/23/21 0126 02/25/21 0052  LIPASE 2,952* 382* 92*   No results for input(s): AMMONIA in the last 168 hours. Coagulation Profile: No results for input(s): INR, PROTIME in the last 168 hours. Cardiac Enzymes: No results for input(s): CKTOTAL, CKMB, CKMBINDEX, TROPONINI in the last 168 hours. BNP (last 3 results) No results for input(s): PROBNP in the last 8760 hours. HbA1C: No results for input(s): HGBA1C in the last 72 hours. CBG: No results for input(s): GLUCAP in the last 168 hours. Lipid Profile: No results for input(s): CHOL, HDL, LDLCALC, TRIG, CHOLHDL, LDLDIRECT in the last 72 hours. Thyroid Function Tests: Recent Labs    02/28/21 0215  TSH 0.039*   FREET4 2.74*   Anemia Panel: No results for input(s): VITAMINB12, FOLATE, FERRITIN, TIBC, IRON, RETICCTPCT in the last 72 hours. Sepsis Labs: Recent Labs  Lab 02/21/21 2021 02/21/21 2207  PROCALCITON 0.96  --   LATICACIDVEN 2.4* 1.1    Recent Results (from the past 240 hour(s))  Blood culture (routine x 2)     Status: Abnormal   Collection Time: 02/21/21  8:21 PM   Specimen: BLOOD  Result Value Ref Range Status   Specimen Description   Final    BLOOD LEFT ANTECUBITAL Performed at [facility], [address], [place], [place] [address]    Special Requests   Final    BOTTLES DRAWN AEROBIC AND ANAEROBIC Blood Culture adequate volume Performed at [facility], [address], [place], [place] [address]    Culture  Setup Time   Final    GRAM NEGATIVE RODS IN BOTH AEROBIC AND ANAEROBIC BOTTLES CRITICAL RESULT CALLED TO, READ BACK BY AND VERIFIED WITH: AMY THOMPSON AT 0840 02/22/21 SDR Performed at [facility], [place] [address], [place], [place] [address]    Culture ESCHERICHIA COLI (A)  Final   Report Status 02/24/2021 FINAL  Final   Organism ID, Bacteria ESCHERICHIA COLI  Final      Susceptibility   Escherichia coli - MIC*    AMPICILLIN 8 SENSITIVE Sensitive     CEFAZOLIN <=4 SENSITIVE Sensitive     CEFEPIME <=0.12 SENSITIVE Sensitive     CEFTAZIDIME <=1 SENSITIVE Sensitive     CEFTRIAXONE <=0.25 SENSITIVE Sensitive     CIPROFLOXACIN <=0.25 SENSITIVE Sensitive     GENTAMICIN <=1 SENSITIVE Sensitive     IMIPENEM <=0.25 SENSITIVE Sensitive     TRIMETH/SULFA <=20 SENSITIVE Sensitive     AMPICILLIN/SULBACTAM 4 SENSITIVE Sensitive     PIP/TAZO <=4 SENSITIVE Sensitive     * ESCHERICHIA COLI  Blood Culture ID Panel (Reflexed)     Status: Abnormal   Collection Time: 02/21/21  8:21 PM  Result Value Ref Range Status   Enterococcus faecalis NOT DETECTED NOT DETECTED Final   Enterococcus Faecium NOT DETECTED NOT DETECTED Final   Listeria monocytogenes  NOT DETECTED NOT DETECTED Final   Staphylococcus species NOT DETECTED NOT DETECTED Final   Staphylococcus aureus (BCID) NOT DETECTED NOT DETECTED Final   Staphylococcus epidermidis NOT DETECTED NOT DETECTED Final   Staphylococcus lugdunensis NOT DETECTED NOT DETECTED Final   Streptococcus species NOT DETECTED NOT DETECTED Final   Streptococcus agalactiae NOT DETECTED NOT DETECTED Final   Streptococcus pneumoniae NOT DETECTED NOT DETECTED Final   Streptococcus pyogenes NOT DETECTED NOT DETECTED Final   A.calcoaceticus-baumannii NOT DETECTED  NOT DETECTED Final   Bacteroides fragilis NOT DETECTED NOT DETECTED Final   Enterobacterales DETECTED (A) NOT DETECTED Final    Comment: Enterobacterales represent a large order of gram negative bacteria, not a single organism. CRITICAL RESULT CALLED TO, READ BACK BY AND VERIFIED WITH:  AMY THOMPSON AT 0840 02/22/21 SDR    Enterobacter cloacae complex NOT DETECTED NOT DETECTED Final   Escherichia coli DETECTED (A) NOT DETECTED Final    Comment: CRITICAL RESULT CALLED TO, READ BACK BY AND VERIFIED WITH:  AMY THOMPSON AT 0840 02/22/21 SDR    Klebsiella aerogenes NOT DETECTED NOT DETECTED Final   Klebsiella oxytoca NOT DETECTED NOT DETECTED Final   Klebsiella pneumoniae NOT DETECTED NOT DETECTED Final   Proteus species NOT DETECTED NOT DETECTED Final   Salmonella species NOT DETECTED NOT DETECTED Final   Serratia marcescens NOT DETECTED NOT DETECTED Final   Haemophilus influenzae NOT DETECTED NOT DETECTED Final   Neisseria meningitidis NOT DETECTED NOT DETECTED Final   Pseudomonas aeruginosa NOT DETECTED NOT DETECTED Final   Stenotrophomonas maltophilia NOT DETECTED NOT DETECTED Final   Candida albicans NOT DETECTED NOT DETECTED Final   Candida auris NOT DETECTED NOT DETECTED Final   Candida glabrata NOT DETECTED NOT DETECTED Final   Candida krusei NOT DETECTED NOT DETECTED Final   Candida parapsilosis NOT DETECTED NOT DETECTED Final   Candida  tropicalis NOT DETECTED NOT DETECTED Final   Cryptococcus neoformans/gattii NOT DETECTED NOT DETECTED Final   CTX-M ESBL NOT DETECTED NOT DETECTED Final   Carbapenem resistance IMP NOT DETECTED NOT DETECTED Final   Carbapenem resistance KPC NOT DETECTED NOT DETECTED Final   Carbapenem resistance NDM NOT DETECTED NOT DETECTED Final   Carbapenem resist OXA 48 LIKE NOT DETECTED NOT DETECTED Final   Carbapenem resistance VIM NOT DETECTED NOT DETECTED Final    Comment: Performed at [facility], [address] [place], [place] [address]  Urine culture     Status: Abnormal   Collection Time: 02/21/21  8:30 PM   Specimen: Urine, Random  Result Value Ref Range Status   Specimen Description   Final    URINE, RANDOM Performed at [facility], [address] [place], [place] [address]    Special Requests   Final    NONE Performed at [facility], [address], [place], [place] [address]    Culture (A)  Final    <10,000 COLONIES/mL INSIGNIFICANT GROWTH Performed at [facility], [address], [place], [place] [address]    Report Status 02/23/2021 FINAL  Final  Blood culture (routine x 2)     Status: Abnormal   Collection Time: 02/21/21  8:39 PM   Specimen: BLOOD  Result Value Ref Range Status   Specimen Description   Final    BLOOD BLOOD RIGHT WRIST Performed at [facility], [address], [place], [place] [address]    Special Requests   Final    BOTTLES DRAWN AEROBIC AND ANAEROBIC Blood Culture adequate volume Performed at [facility], [address] [place], [place] [address]    Culture  Setup Time   Final    GRAM NEGATIVE RODS IN BOTH AEROBIC AND ANAEROBIC BOTTLES CRITICAL VALUE NOTED.  VALUE IS CONSISTENT WITH PREVIOUSLY REPORTED AND CALLED VALUE. Performed at [facility], [address] [place], [place] [address]    Culture (A)  Final    ESCHERICHIA COLI SUSCEPTIBILITIES PERFORMED ON PREVIOUS CULTURE  WITHIN THE LAST 5 DAYS. Performed at [facility], [place] [address] [place], [place] [address]    Report Status 02/24/2021 FINAL  Final  Resp Panel by RT-PCR (Flu A&B, Covid) Nasopharyngeal Swab     Status: None   Collection Time: 02/21/21  8:39 PM   Specimen: Nasopharyngeal Swab; Nasopharyngeal(NP) swabs in vial transport medium  Result Value Ref Range Status   SARS Coronavirus 2 by RT PCR NEGATIVE NEGATIVE Final    Comment: (NOTE) SARS-CoV-2 target nucleic acids are NOT DETECTED.  The SARS-CoV-2 RNA is generally detectable in upper respiratory specimens during the acute phase of infection. The lowest concentration of SARS-CoV-2 viral copies this assay can detect is 138 copies/mL. A negative result does not preclude SARS-Cov-2 infection and should not be used as the sole basis for treatment or other patient management decisions. A negative result may occur with  improper specimen collection/handling, submission of specimen other than nasopharyngeal swab, presence of viral mutation(s) within the areas targeted by this assay, and inadequate number of viral copies(<138 copies/mL). A negative result must be combined with clinical observations, patient history, and epidemiological information. The expected result is Negative.  Fact Sheet for Patients:  EntrepreneurPulse.com.au  Fact Sheet for Healthcare Providers:  IncredibleEmployment.be  This test is no t yet approved or cleared by the [place] FDA and  has been authorized for detection and/or diagnosis of SARS-CoV-2 by FDA under an Emergency Use Authorization (EUA). This EUA will remain  in effect (meaning this test can be used) for the duration of the COVID-19 declaration under Section 564(b)(1) of the Act, 21 U.S.C.section 360bbb-3(b)(1), unless the authorization is terminated  or revoked sooner.       Influenza A by PCR NEGATIVE NEGATIVE Final   Influenza B by PCR NEGATIVE  NEGATIVE Final    Comment: (NOTE) The Xpert Xpress SARS-CoV-2/FLU/RSV plus assay is intended as an aid in the diagnosis of influenza from Nasopharyngeal swab specimens and should not be used as a sole basis for treatment. Nasal washings and aspirates are unacceptable for Xpert Xpress SARS-CoV-2/FLU/RSV testing.  Fact Sheet for Patients: EntrepreneurPulse.com.au  Fact Sheet for Healthcare Providers: IncredibleEmployment.be  This test is not yet approved or cleared by the [place] FDA and has been authorized for detection and/or diagnosis of SARS-CoV-2 by FDA under an Emergency Use Authorization (EUA). This EUA will remain in effect (meaning this test can be used) for the duration of the COVID-19 declaration under Section 564(b)(1) of the Act, 21 U.S.C. section 360bbb-3(b)(1), unless the authorization is terminated or revoked.  Performed at [facility], [address], [place], [place] [address]   Surgical pcr screen     Status: Abnormal   Collection Time: 02/23/21  8:25 PM   Specimen: Nasal Mucosa; Nasal Swab  Result Value Ref Range Status   MRSA, PCR NEGATIVE NEGATIVE Final   Staphylococcus aureus POSITIVE (A) NEGATIVE Final    Comment: (NOTE) The Xpert SA Assay (FDA approved for NASAL specimens in patients 26 years of age and older), is one component of a comprehensive surveillance program. It is not intended to diagnose infection nor to guide or monitor treatment. Performed at [facility], [place] [address] [place], [place] [address]          Radiology Studies: No results found.      Scheduled Meds: . bisacodyl  5 mg Oral Daily  . carvedilol  3.125 mg Oral BID WC  . Chlorhexidine Gluconate Cloth  6 each Topical Daily  . mupirocin ointment  1 application  Nasal BID  . pantoprazole (PROTONIX) IV  40 mg Intravenous Q24H   Continuous Infusions: . cefTRIAXone (ROCEPHIN)  IV 2 g (02/27/21 2105)  . lactated  ringers 50 mL/hr at 02/28/21 0516  . metronidazole 500 mg (02/28/21 0700)    Assessment & Plan:   Principal Problem:   Acute pancreatitis Active Problems:   Severe sepsis (HCC)   Cholangitis   Abdominal pain   Nausea   Hypertension   GERD (gastroesophageal reflux disease)   Elevated troponin   #) Acute biliary pancreatitis:  GI Consulted, input was appreciated. Presumed biliary in the setting of cholelithiasis, dilated CBD.  None of the recent imaging confirms actual choledocholelithiasis so may have passed a stone ?cholangitis-on metro and rocephin Cardiology was consulted for preoperative stratification, was cleared for surgery 5/3-POD #2 s/p lap chole. By Dr. Durward Fortes  JP drain in place No flatus or BM possibly has mild ileus Encourage mobilization, KUB per sx Operative findings of liver cyst and cirrhosis-will need pcp/GI follow up     #) Severe sepsis/enterobacters. and E. coli bacteremia Upon arrival at [facility] ED on 4 / 26/22, criteria met for sepsis in the setting of presenting objective fever, tachycardia, leukocytosis, lactic acid elevation. Likely from above 5/4-continue Rocephin for total 10 days      #) Elevated troponin: Possibly secondary to demand ischemia  EKG no ischemic changes  Echo with EF of 45 to 50%.  Diffuse hypokinesis with abnormal septal motion worsening inferior basal wall 5/3 cardiology following Will need outpatient f/u , hold off asa given recent post op status    #)LV systolic dysfunction EF 22-63%. Cards following Started on low dose coreg with parameters DC ivf as he is retaining fluid. May need lasix prn , will monitor for now Will need outpatient follow up  May need ARB can be evaluated as outpt by cards  #Mild Dilation of acending aorta No surgical intervention at this time BP control Will need yearly assessment      #PSVT- brief run.  On 4/29 asx echo findings Ef 45-50%, diffuse hypokinesis with abn. Septal  motion worsening inferior basal wall Continue carvedilol 3.125 mg twice daily TSH down, FT4 up on labs, will need reevaluation after acute illness as outpt and further mx by pcp   #AKI-etiology unclear. Has been on ivf.  Avoid nephrotoxic meds Monitor levels    #) Essential hypertension: Not on any antihypertensive medications at home. Started on beta blk.       #) GERD: Continue IV PPI        DVT prophylaxis: SCD Code Status: Full Family Communication: Family at bedside  Status is: Inpatient  Remains inpatient appropriate because:Inpatient level of care appropriate due to severity of illness   Dispo: The patient is from: Home              Anticipated d/c is to: Home              Patient currently is not medically stable to d/c.   Difficult to place patient No            LOS: 6 days   Time spent: 45 minutes with more than 50% on [place], MD [facility] Pager [phone]  If 7PM-7AM, please contact night-coverage 02/28/2021, 8:56 AM

## 2021-02-28 NOTE — TOC Initial Note (Addendum)
Transition of Care [facility]) - Initial/Assessment Note    Patient Details  Name: Travis Gallagher MRN: [id number] Date of Birth: 29-Dec-1930  Transition of Care [facility]) CM/SW Contact:    Kingsley Plan, RN Phone Number: 02/28/2021, 1:28 PM  Clinical Narrative:                 NCM spoke to OT and PT , they recommend hhpt , 3 in 1 and walker. They are no longer recommending a wheel chair   Spoke to patient and daughter [name] with interpreter Wyvonnia Dusky. Patient and his wife staying with her at address in Epic.   [name]'s cell is [phone] and her husband's cell is [phone]. [name]'s children are the only ones in the home who speak [place].   Discussed above recommendations. [name] in agreement. Explained [employer] will call regarding DME and [facility] will call and ask finical questions to see if he qualifies for charity home health. If he does not qualify for charity home health , discussed OP PT at [facility] and [place] in agreement.    Patient does not have a PCP. NCM emailed [facility] in [place] to see if he can establish care there. [name] aware.   NCM will ask [facility] operational officer if he / she can write orders for patient for home health between discharge and appointment with [facility].   Explained [facility] program and [facility] pharmacy to daughter also. She voiced understanding.  Will continue to follow.  [address] at [employer] in [place] will have someone call patient / yolanda to see if he is eligible .   Expected Discharge Plan: Home w Home Health Services     Patient Goals and CMS Choice Patient states their goals for this hospitalization and ongoing recovery are:: to return to daughters home CMS Medicare.gov Compare Post Acute Care list provided to:: Patient    Expected Discharge Plan and Services Expected Discharge Plan: Home w Home Health Services   Discharge Planning Services: CM Consult Post Acute Care Choice: Durable  Medical Equipment Living arrangements for the past 2 months: Single Family Home                 DME Arranged: Dan Humphreys rolling,3-N-1 DME Agency: [employer]       HH Arranged: PT HH Agency: [facility] Date HH Agency Contacted: 02/28/21 Time HH Agency Contacted: 1327 Representative spoke with at [facility] Agency: Misty Stanley  Prior Living Arrangements/Services Living arrangements for the past 2 months: Single Family Home Lives with:: Adult Children Patient language and need for interpreter reviewed:: Yes Do you feel safe going back to the place where you live?: Yes      Need for Family Participation in Patient Care: Yes (Comment) Care giver support system in place?: Yes (comment)   Criminal Activity/Legal Involvement Pertinent to Current Situation/Hospitalization: No - Comment as needed  Activities of Daily Living Home Assistive Devices/Equipment: None ADL Screening (condition at time of admission) Patient's cognitive ability adequate to safely complete daily activities?: Yes Is the patient deaf or have difficulty hearing?: Yes Does the patient have difficulty seeing, even when wearing glasses/contacts?: No Does the patient have difficulty concentrating, remembering, or making decisions?: No Patient able to express need for assistance with ADLs?: Yes Does the patient have difficulty dressing or bathing?: No Independently performs ADLs?: No Communication: Independent with device (comment) Musician) Dressing (OT): Needs assistance Grooming: Needs assistance Is this a change from baseline?: Pre-admission baseline Feeding:  Independent Bathing: Needs assistance Is this a change from baseline?: Pre-admission baseline Toileting: Needs assistance Is this a change from baseline?: Pre-admission baseline In/Out Bed: Needs assistance Is this a change from baseline?: Pre-admission baseline Walks in Home: Independent Does the patient have difficulty walking or climbing stairs?: No Weakness  of Legs: None Weakness of Arms/Hands: None  Permission Sought/Granted   Permission granted to share information with : Yes, Verbal Permission Granted  Share Information with NAME: daughter [name]           Emotional Assessment Appearance:: Appears stated age Attitude/Demeanor/Rapport: Ambitious   Orientation: : Oriented to Situation,Oriented to  Time,Oriented to Place,Oriented to Self Alcohol / Substance Use: Not Applicable Psych Involvement: No (comment)  Admission diagnosis:  Abdominal pain [R10.9] Acute pancreatitis [K85.90] Patient Active Problem List   Diagnosis Date Noted  . Acute pancreatitis 02/22/2021  . Severe sepsis (HCC) 02/22/2021  . Cholangitis 02/22/2021  . Abdominal pain 02/22/2021  . Nausea 02/22/2021  . Hypertension   . GERD (gastroesophageal reflux disease)   . Elevated troponin    PCP:  Pcp, No Pharmacy:   [facility] [address]), [place] - [address]) [place] [address] Phone: [phone] Fax: [phone]     Social Determinants of Health (SDOH) Interventions    Readmission Risk Interventions No flowsheet data found.

## 2021-03-01 ENCOUNTER — Encounter (HOSPITAL_COMMUNITY): Payer: Self-pay | Admitting: Internal Medicine

## 2021-03-01 ENCOUNTER — Inpatient Hospital Stay (HOSPITAL_COMMUNITY): Payer: Self-pay | Admitting: Anesthesiology

## 2021-03-01 ENCOUNTER — Encounter (HOSPITAL_COMMUNITY)
Admission: AD | Disposition: A | Discharge status: Home / Self Care | Payer: Self-pay | Source: Other Acute Inpatient Hospital | Attending: Internal Medicine

## 2021-03-01 ENCOUNTER — Inpatient Hospital Stay (HOSPITAL_COMMUNITY): Payer: Self-pay

## 2021-03-01 HISTORY — PX: LAPAROTOMY: SHX154

## 2021-03-01 LAB — COMPREHENSIVE METABOLIC PANEL
ALT: 29 U/L (ref 0–44)
AST: 59 U/L — ABNORMAL HIGH (ref 15–41)
Albumin: 1.7 g/dL — ABNORMAL LOW (ref 3.5–5.0)
Alkaline Phosphatase: 203 U/L — ABNORMAL HIGH (ref 38–126)
Anion gap: 5 (ref 5–15)
BUN: 50 mg/dL — ABNORMAL HIGH (ref 8–23)
CO2: 23 mmol/L (ref 22–32)
Calcium: 7.1 mg/dL — ABNORMAL LOW (ref 8.9–10.3)
Chloride: 108 mmol/L (ref 98–111)
Creatinine, Ser: 2.4 mg/dL — ABNORMAL HIGH (ref 0.61–1.24)
GFR, Estimated: 25 mL/min — ABNORMAL LOW (ref >60)
Glucose, Bld: 117 mg/dL — ABNORMAL HIGH (ref 70–99)
Potassium: 3.5 mmol/L (ref 3.5–5.1)
Sodium: 136 mmol/L (ref 135–145)
Total Bilirubin: 0.8 mg/dL (ref 0.3–1.2)
Total Protein: 4.5 g/dL — ABNORMAL LOW (ref 6.5–8.1)

## 2021-03-01 LAB — CBC
HCT: 32.9 % — ABNORMAL LOW (ref 39.0–52.0)
Hemoglobin: 10.9 g/dL — ABNORMAL LOW (ref 13.0–17.0)
MCH: 29.5 pg (ref 26.0–34.0)
MCHC: 33.1 g/dL (ref 30.0–36.0)
MCV: 89.2 fL (ref 80.0–100.0)
Platelets: 181 10*3/uL (ref 150–400)
RBC: 3.69 MIL/uL — ABNORMAL LOW (ref 4.22–5.81)
RDW: 13.4 % (ref 11.5–15.5)
WBC: 13.5 10*3/uL — ABNORMAL HIGH (ref 4.0–10.5)
nRBC: 0 % (ref 0.0–0.2)

## 2021-03-01 LAB — TYPE AND SCREEN
ABO/RH(D): B POS
Antibody Screen: NEGATIVE

## 2021-03-01 LAB — ABO/RH: ABO/RH(D): B POS

## 2021-03-01 SURGERY — LAPAROTOMY, EXPLORATORY
Anesthesia: General | Site: Abdomen

## 2021-03-01 MED ORDER — POTASSIUM CHLORIDE 2 MEQ/ML IV SOLN
INTRAVENOUS | Status: AC
  Filled 2021-03-01 (×4): qty 1000

## 2021-03-01 MED ORDER — PIPERACILLIN-TAZOBACTAM 3.375 G IVPB
3.3750 g | Freq: Three times a day (TID) | INTRAVENOUS | Status: DC
  Administered 2021-03-01 – 2021-03-11 (×30): 3.375 g via INTRAVENOUS
  Filled 2021-03-01 (×29): qty 50

## 2021-03-01 MED ORDER — ACETAMINOPHEN 10 MG/ML IV SOLN
1000.0000 mg | Freq: Three times a day (TID) | INTRAVENOUS | Status: AC
  Administered 2021-03-01 – 2021-03-02 (×3): 1000 mg via INTRAVENOUS
  Filled 2021-03-01 (×3): qty 100

## 2021-03-01 MED ORDER — CHLORHEXIDINE GLUCONATE 0.12 % MT SOLN
OROMUCOSAL | Status: AC
  Administered 2021-03-01: 15 mL via OROMUCOSAL
  Filled 2021-03-01: qty 15

## 2021-03-01 MED ORDER — PROPOFOL 10 MG/ML IV BOLUS
INTRAVENOUS | Status: AC
  Filled 2021-03-01: qty 20

## 2021-03-01 MED ORDER — METHOCARBAMOL 500 MG PO TABS: 500.0000 mg | ORAL_TABLET | Freq: Three times a day (TID) | ORAL | Status: DC

## 2021-03-01 MED ORDER — STERILE WATER FOR IRRIGATION IR SOLN
Status: DC | PRN
  Administered 2021-03-01: 1000 mL

## 2021-03-01 MED ORDER — CHLORHEXIDINE GLUCONATE 4 % EX LIQD
1.0000 "application " | Freq: Once | CUTANEOUS | Status: DC
  Filled 2021-03-01: qty 15

## 2021-03-01 MED ORDER — CHLORHEXIDINE GLUCONATE CLOTH 2 % EX PADS
6.0000 | MEDICATED_PAD | Freq: Every day | CUTANEOUS | Status: DC
  Administered 2021-03-01 – 2021-03-14 (×14): 6 via TOPICAL

## 2021-03-01 MED ORDER — SODIUM CHLORIDE 0.9 % IV SOLN: INTRAVENOUS | Status: DC | PRN

## 2021-03-01 MED ORDER — LACTATED RINGERS IV SOLN: INTRAVENOUS | Status: DC | PRN

## 2021-03-01 MED ORDER — FENTANYL CITRATE (PF) 100 MCG/2ML IJ SOLN
INTRAMUSCULAR | Status: AC
  Filled 2021-03-01: qty 2

## 2021-03-01 MED ORDER — DEXAMETHASONE SODIUM PHOSPHATE 10 MG/ML IJ SOLN
INTRAMUSCULAR | Status: DC | PRN
  Administered 2021-03-01: 10 mg via INTRAVENOUS

## 2021-03-01 MED ORDER — ETOMIDATE 2 MG/ML IV SOLN
INTRAVENOUS | Status: DC | PRN
  Administered 2021-03-01: 16 mg via INTRAVENOUS

## 2021-03-01 MED ORDER — SUCCINYLCHOLINE CHLORIDE 200 MG/10ML IV SOSY
PREFILLED_SYRINGE | INTRAVENOUS | Status: DC | PRN
  Administered 2021-03-01: 120 mg via INTRAVENOUS

## 2021-03-01 MED ORDER — ALBUMIN HUMAN 5 % IV SOLN: INTRAVENOUS | Status: DC | PRN

## 2021-03-01 MED ORDER — PHENYLEPHRINE HCL-NACL 10-0.9 MG/250ML-% IV SOLN
INTRAVENOUS | Status: DC | PRN
  Administered 2021-03-01: 40 ug/min via INTRAVENOUS

## 2021-03-01 MED ORDER — 0.9 % SODIUM CHLORIDE (POUR BTL) OPTIME
TOPICAL | Status: DC | PRN
  Administered 2021-03-01 (×4): 1000 mL

## 2021-03-01 MED ORDER — LIDOCAINE 2% (20 MG/ML) 5 ML SYRINGE
INTRAMUSCULAR | Status: DC | PRN
  Administered 2021-03-01: 80 mg via INTRAVENOUS

## 2021-03-01 MED ORDER — CHLORHEXIDINE GLUCONATE 0.12 % MT SOLN: 15.0000 mL | OROMUCOSAL | Status: AC

## 2021-03-01 MED ORDER — SUGAMMADEX SODIUM 200 MG/2ML IV SOLN
INTRAVENOUS | Status: DC | PRN
  Administered 2021-03-01: 300 mg via INTRAVENOUS

## 2021-03-01 MED ORDER — ONDANSETRON HCL 4 MG/2ML IJ SOLN
INTRAMUSCULAR | Status: DC | PRN
  Administered 2021-03-01: 4 mg via INTRAVENOUS

## 2021-03-01 MED ORDER — KCL-LACTATED RINGERS 20 MEQ/L IV SOLN
INTRAVENOUS | Status: DC
  Filled 2021-03-01: qty 1000

## 2021-03-01 MED ORDER — ROCURONIUM BROMIDE 10 MG/ML (PF) SYRINGE
PREFILLED_SYRINGE | INTRAVENOUS | Status: DC | PRN
  Administered 2021-03-01: 50 mg via INTRAVENOUS

## 2021-03-01 MED ORDER — FENTANYL CITRATE (PF) 250 MCG/5ML IJ SOLN
INTRAMUSCULAR | Status: AC
  Filled 2021-03-01: qty 5

## 2021-03-01 MED ORDER — FENTANYL CITRATE (PF) 250 MCG/5ML IJ SOLN
INTRAMUSCULAR | Status: DC | PRN
  Administered 2021-03-01 (×3): 50 ug via INTRAVENOUS
  Administered 2021-03-01: 100 ug via INTRAVENOUS

## 2021-03-01 MED ORDER — FENTANYL CITRATE (PF) 100 MCG/2ML IJ SOLN
25.0000 ug | INTRAMUSCULAR | Status: DC | PRN
  Administered 2021-03-01: 50 ug via INTRAVENOUS
  Administered 2021-03-01: 25 ug via INTRAVENOUS
  Administered 2021-03-01: 50 ug via INTRAVENOUS

## 2021-03-01 MED ORDER — SODIUM CHLORIDE 0.9 % IV SOLN: INTRAVENOUS | Status: DC

## 2021-03-01 MED ORDER — METOPROLOL TARTRATE 5 MG/5ML IV SOLN
2.5000 mg | Freq: Four times a day (QID) | INTRAVENOUS | Status: DC
  Administered 2021-03-01 – 2021-03-10 (×35): 2.5 mg via INTRAVENOUS
  Filled 2021-03-01 (×32): qty 5

## 2021-03-01 SURGICAL SUPPLY — 57 items
BIOPATCH RED 1 DISK 7.0 (GAUZE/BANDAGES/DRESSINGS) ×2 IMPLANT
BLADE CLIPPER SURG (BLADE) IMPLANT
CANISTER SUCT 3000ML PPV (MISCELLANEOUS) IMPLANT
CHLORAPREP W/TINT 26 (MISCELLANEOUS) ×2 IMPLANT
COVER SURGICAL LIGHT HANDLE (MISCELLANEOUS) ×2 IMPLANT
COVER WAND RF STERILE (DRAPES) IMPLANT
DERMABOND ADVANCED (GAUZE/BANDAGES/DRESSINGS)
DERMABOND ADVANCED .7 DNX12 (GAUZE/BANDAGES/DRESSINGS) IMPLANT
DRAIN CHANNEL 19F RND (DRAIN) ×2 IMPLANT
DRAPE INCISE IOBAN 66X45 STRL (DRAPES) ×2 IMPLANT
DRAPE LAPAROSCOPIC ABDOMINAL (DRAPES) ×2 IMPLANT
DRAPE WARM FLUID 44X44 (DRAPES) ×2 IMPLANT
DRSG OPSITE POSTOP 4X10 (GAUZE/BANDAGES/DRESSINGS) ×2 IMPLANT
DRSG OPSITE POSTOP 4X8 (GAUZE/BANDAGES/DRESSINGS) IMPLANT
DRSG TEGADERM 4X4.5 CHG (GAUZE/BANDAGES/DRESSINGS) ×4 IMPLANT
ELECT BLADE 6.5 EXT (BLADE) IMPLANT
ELECT CAUTERY BLADE 6.4 (BLADE) IMPLANT
ELECT REM PT RETURN 9FT ADLT (ELECTROSURGICAL) ×2
ELECTRODE REM PT RTRN 9FT ADLT (ELECTROSURGICAL) ×1 IMPLANT
EVACUATOR SILICONE 100CC (DRAIN) ×2 IMPLANT
GAUZE SPONGE 2X2 8PLY STRL LF (GAUZE/BANDAGES/DRESSINGS) ×1 IMPLANT
GLOVE BIOGEL PI IND STRL 6 (GLOVE) ×1 IMPLANT
GLOVE BIOGEL PI INDICATOR 6 (GLOVE) ×1
GLOVE BIOGEL PI MICRO 5.5 (GLOVE) ×1
GLOVE BIOGEL PI MICRO STRL 5.5 (GLOVE) ×1 IMPLANT
GOWN STRL REUS W/ TWL LRG LVL3 (GOWN DISPOSABLE) ×2 IMPLANT
GOWN STRL REUS W/ TWL XL LVL3 (GOWN DISPOSABLE) ×1 IMPLANT
GOWN STRL REUS W/TWL LRG LVL3 (GOWN DISPOSABLE) ×2
GOWN STRL REUS W/TWL XL LVL3 (GOWN DISPOSABLE) ×1
HANDLE SUCTION POOLE (INSTRUMENTS) ×1 IMPLANT
KIT BASIN OR (CUSTOM PROCEDURE TRAY) ×2 IMPLANT
KIT TURNOVER KIT B (KITS) ×2 IMPLANT
LIGASURE IMPACT 36 18CM CVD LR (INSTRUMENTS) IMPLANT
NS IRRIG 1000ML POUR BTL (IV SOLUTION) ×8 IMPLANT
PACK GENERAL/GYN (CUSTOM PROCEDURE TRAY) ×2 IMPLANT
PAD ARMBOARD 7.5X6 YLW CONV (MISCELLANEOUS) ×2 IMPLANT
PENCIL SMOKE EVACUATOR (MISCELLANEOUS) ×2 IMPLANT
SLEEVE SUCTION CATH 165 (SLEEVE) IMPLANT
SPECIMEN JAR LARGE (MISCELLANEOUS) IMPLANT
SPONGE GAUZE 2X2 STER 10/PKG (GAUZE/BANDAGES/DRESSINGS) ×1
SPONGE LAP 18X18 RF (DISPOSABLE) ×4 IMPLANT
STAPLER VISISTAT 35W (STAPLE) IMPLANT
SUCTION POOLE HANDLE (INSTRUMENTS) ×2
SUT ETHILON 2 0 FS 18 (SUTURE) ×2 IMPLANT
SUT MNCRL AB 4-0 PS2 18 (SUTURE) IMPLANT
SUT PDS AB 1 TP1 96 (SUTURE) ×4 IMPLANT
SUT SILK 2 0 SH CR/8 (SUTURE) ×2 IMPLANT
SUT SILK 2 0 TIES 10X30 (SUTURE) ×4 IMPLANT
SUT SILK 3 0 SH CR/8 (SUTURE) ×2 IMPLANT
SUT SILK 3 0 TIES 10X30 (SUTURE) ×2 IMPLANT
SUT VIC AB 3-0 SH 18 (SUTURE) IMPLANT
SUT VIC AB 3-0 SH 27 (SUTURE) ×1
SUT VIC AB 3-0 SH 27XBRD (SUTURE) ×1 IMPLANT
TOWEL GREEN STERILE (TOWEL DISPOSABLE) ×2 IMPLANT
TRAY FOLEY MTR SLVR 16FR STAT (SET/KITS/TRAYS/PACK) ×2 IMPLANT
TUBE CONNECTING 12X1/4 (SUCTIONS) ×2 IMPLANT
YANKAUER SUCT BULB TIP NO VENT (SUCTIONS) ×4 IMPLANT

## 2021-03-01 NOTE — Transfer of Care (Signed)
Immediate Anesthesia Transfer of Care Note  Patient: Travis Gallagher  Procedure(s) Performed: EXPLORATORY LAPAROTOMY - REPAIR OF DUODENAL PERFORATION, J TUBE (N/A )  Patient Location: PACU  Anesthesia Type:General  Level of Consciousness: awake and alert   Airway & Oxygen Therapy: Patient connected to face mask oxygen  Post-op Assessment: Report given to RN, Post -op Vital signs reviewed and stable and Patient moving all extremities X 4  Post vital signs: Reviewed and stable  Last Vitals:  Vitals Value Taken Time  BP 173/73 03/01/21 2104  Temp    Pulse 97 03/01/21 2104  Resp 21 03/01/21 2104  SpO2 93 % 03/01/21 2104  Vitals shown include unvalidated device data.  Last Pain:  Vitals:   03/01/21 1321  TempSrc:   PainSc: Asleep      Patients Stated Pain Goal: 0 (03/01/21 1236)  Complications: No complications documented.

## 2021-03-01 NOTE — Progress Notes (Signed)
PROGRESS NOTE  Travis Gallagher RFF:[id number] DOB: February 24, 1931 DOA: 02/22/2021 PCP: Pcp, No   LOS: 7 days   Brief narrative:  Travis Gerold Rodriguezis a 87 y.o.malewith medical history significant forhypertension, GERDwas admitted to hospital as transfer from [facility] for management of acute pancreatitis in the setting of potential choledocholithiasis.  Patient was also found to have acute biliary pancreatitis.  GI was initially consulted but MRCP did not show choledocholithiasis.  Patient underwent laparoscopic cholecystectomy.  Cardiology was consulted for preop risk stratification.  Assessment/Plan:  Principal Problem:   Acute pancreatitis Active Problems:   Severe sepsis (HCC)   Cholangitis   Abdominal pain   Nausea   Hypertension   GERD (gastroesophageal reflux disease)   Elevated troponin   Acute biliary pancreatitis:  Patient underwent laparoscopic cholecystectomy by general surgery on 02/26/2021.  JP drain in place still with drainage.  Today with increasing pain.  Keep NPO.  Might need a CT scan.  Rising creatinine levels.  We will need to closely monitor.  Liver cyst and cirrhosis during surgical intervention.  Will need outpatient PCP and GI follow-up.    Severe sepsis/enterobacter and E. coli bacteremia secondary to ascending cholangitis, gangrenous cholecystitis Continue IV Rocephin for total of 10 days.  Patient initially met sepsis criteria on admission.    Elevated troponin with LV systolic dysfunction.: EKG did not show any ischemic changes.  Possibly secondary to demand ischemia .  2D echocardiogram with left ventricular ejection fraction of 40 to 45% with diffuse hypokinesis and abnormal septal motion wall.  Cardiology on board.  Aspirin on hold due to recent postop status.  Will need outpatient follow-up with cardiology.  Has been started on low-dose Coreg.  Off IV fluids.  Will need ARB at some point.  Mild Dilation of acending aorta Follow-up  as outpatient.  PSVT- 2D echo with EF of 45 to 50%.  On Coreg 3.125 mg twice a days.  Will need TSH follow-up as outpatient.  Acute kidney injury Patient with a creatinine of 2.4 today.  We will continue to monitor.  Avoid nephrotoxic medications.  We will continue with IV fluid hydration today.  Monitor intake and output charting.  Daily weights.  Overall patient is positive balance for [id number].   Essential hypertension: Not on antihypertensives at home.  Has been started on beta-blockers.   GERD: Continue IV PPI   DVT prophylaxis: Place and maintain sequential compression device Start: 02/23/21 1520 SCDs Start: 02/22/21 1225   Code Status: Full code  Family Communication: None  Status is: Inpatient  Remains inpatient appropriate because:Unsafe d/c plan, IV treatments appropriate due to intensity of illness or inability to take PO and Inpatient level of care appropriate due to severity of illness   Dispo: The patient is from: Home              Anticipated d/c is to: Home              Patient currently is not medically stable to d/c.   Difficult to place patient No  Consultants:  GI  Cardiology  General surgery  Procedures:  Laparoscopic cholecystectomy 02/26/2021  Anti-infectives:  Marland Kitchen Ceftriaxone and metronidazole IV 4/27>  Anti-infectives (From admission, onward)   Start     Dose/Rate Route Frequency Ordered Stop   02/26/21 1500  metroNIDAZOLE (FLAGYL) IVPB 500 mg        500 mg 100 mL/hr over 60 Minutes Intravenous Every 8 hours 02/26/21 1414 03/02/21 1139   02/22/21 2000  cefTRIAXone (  ROCEPHIN) 2 g in sodium chloride 0.9 % 100 mL IVPB        2 g 200 mL/hr over 30 Minutes Intravenous Daily at 10 pm 02/22/21 1339 03/02/21 1140   02/22/21 1600  metroNIDAZOLE (FLAGYL) IVPB 500 mg  Status:  Discontinued        500 mg 100 mL/hr over 60 Minutes Intravenous Every 8 hours 02/22/21 1339 02/26/21 1414     Subjective:  Today, patient was seen and examined at  bedside.  Still complains of abdominal pain with increased distention.  No mention of vomiting.  Patient is Spanish-speaking.  Had small bowel movement with suppository.  Objective: Vitals:   03/01/21 0457 03/01/21 1008  BP: (!) 126/52 133/60  Pulse: 81 81  Resp: 17 17  Temp: 98.1 F (36.7 C) 98.2 F (36.8 C)  SpO2: 95% 96%    Intake/Output Summary (Last 24 hours) at 03/01/2021 1416 Last data filed at 03/01/2021 1242 Gross per 24 hour  Intake 1094.47 ml  Output 415 ml  Net 679.47 ml   Filed Weights   02/25/21 0541 02/27/21 0604 03/01/21 0457  Weight: 64 kg 69.5 kg 70.8 kg   Body mass index is 24.45 kg/m.   Physical Exam:  GENERAL: Patient is alert awake and communicative.  In mild distress due to pain HENT: No scleral pallor or icterus. Pupils equally reactive to light. Oral mucosa is moist NECK: is supple, no gross swelling noted. CHEST: Clear to auscultation. No crackles or wheezes.  Diminished breath sounds bilaterally. CVS: S1 and S2 heard, no murmur. Regular rate and rhythm.  ABDOMEN: Soft, mildly distended abdomen with tenderness, bowel sounds are present.  Mild guarding over the right lower quadrant.  JP drain in place with cloudy bilious output.Marland Kitchen EXTREMITIES: Mild edema noted CNS: Cranial nerves are intact. No focal motor deficits. SKIN: warm and dry without rashes.  Data Review: I have personally reviewed the following laboratory data and studies,  CBC: Recent Labs  Lab 02/23/21 0126 02/24/21 0455 02/27/21 0048 02/28/21 0215 03/01/21 0242  WBC 7.6 9.5 8.4 11.9* 13.5*  NEUTROABS 6.6  --   --   --   --   HGB 12.7* 12.1* 12.8* 12.5* 10.9*  HCT 38.1* 34.5* 38.1* 37.4* 32.9*  MCV 89.0 86.5 86.4 87.6 89.2  PLT 89* 80* 149* 177 323   Basic Metabolic Panel: Recent Labs  Lab 02/23/21 0126 02/24/21 0455 02/25/21 0052 02/27/21 0048 02/28/21 0215 03/01/21 0242  NA 138 136  --  140 138 136  K 3.6 3.2* 3.6 3.9 3.6 3.5  CL 106 106  --  109 108 108  CO2 20*  23  --  22 21* 23  GLUCOSE 75 104*  --  141* 142* 117*  BUN 23 18  --  20 36* 50*  CREATININE 1.27* 1.07  --  1.11 1.45* 2.40*  CALCIUM 8.2* 8.1*  --  7.7* 7.3* 7.1*  MG 2.0  --   --   --   --   --    Liver Function Tests: Recent Labs  Lab 02/23/21 0126 02/24/21 0455 02/27/21 0048 03/01/21 0242  AST 66* 30 54* 59*  ALT 95* 56* 39 29  ALKPHOS 169* 137* 150* 203*  BILITOT 2.6* 1.2 1.0 0.8  PROT 6.2* 5.5* 5.5* 4.5*  ALBUMIN 2.6* 2.1* 2.0* 1.7*   Recent Labs  Lab 02/23/21 0126 02/25/21 0052  LIPASE 382* 92*   No results for input(s): AMMONIA in the last 168 hours. Cardiac Enzymes: No results  for input(s): CKTOTAL, CKMB, CKMBINDEX, TROPONINI in the last 168 hours. BNP (last 3 results) No results for input(s): BNP in the last 8760 hours.  ProBNP (last 3 results) No results for input(s): PROBNP in the last 8760 hours.  CBG: No results for input(s): GLUCAP in the last 168 hours. Recent Results (from the past 240 hour(s))  Blood culture (routine x 2)     Status: Abnormal   Collection Time: 02/21/21  8:21 PM   Specimen: BLOOD  Result Value Ref Range Status   Specimen Description   Final    BLOOD LEFT ANTECUBITAL Performed at [facility], [address] [place], [place] [address]    Special Requests   Final    BOTTLES DRAWN AEROBIC AND ANAEROBIC Blood Culture adequate volume Performed at [facility], [address] [place], [place] [address]    Culture  Setup Time   Final    GRAM NEGATIVE RODS IN BOTH AEROBIC AND ANAEROBIC BOTTLES CRITICAL RESULT CALLED TO, READ BACK BY AND VERIFIED WITH: AMY THOMPSON AT 0840 02/22/21 SDR Performed at [facility], [place] [address], [place], [place] [address]    Culture ESCHERICHIA COLI (A)  Final   Report Status 02/24/2021 FINAL  Final   Organism ID, Bacteria ESCHERICHIA COLI  Final      Susceptibility   Escherichia coli - MIC*    AMPICILLIN 8 SENSITIVE Sensitive     CEFAZOLIN <=4 SENSITIVE Sensitive      CEFEPIME <=0.12 SENSITIVE Sensitive     CEFTAZIDIME <=1 SENSITIVE Sensitive     CEFTRIAXONE <=0.25 SENSITIVE Sensitive     CIPROFLOXACIN <=0.25 SENSITIVE Sensitive     GENTAMICIN <=1 SENSITIVE Sensitive     IMIPENEM <=0.25 SENSITIVE Sensitive     TRIMETH/SULFA <=20 SENSITIVE Sensitive     AMPICILLIN/SULBACTAM 4 SENSITIVE Sensitive     PIP/TAZO <=4 SENSITIVE Sensitive     * ESCHERICHIA COLI  Blood Culture ID Panel (Reflexed)     Status: Abnormal   Collection Time: 02/21/21  8:21 PM  Result Value Ref Range Status   Enterococcus faecalis NOT DETECTED NOT DETECTED Final   Enterococcus Faecium NOT DETECTED NOT DETECTED Final   Listeria monocytogenes NOT DETECTED NOT DETECTED Final   Staphylococcus species NOT DETECTED NOT DETECTED Final   Staphylococcus aureus (BCID) NOT DETECTED NOT DETECTED Final   Staphylococcus epidermidis NOT DETECTED NOT DETECTED Final   Staphylococcus lugdunensis NOT DETECTED NOT DETECTED Final   Streptococcus species NOT DETECTED NOT DETECTED Final   Streptococcus agalactiae NOT DETECTED NOT DETECTED Final   Streptococcus pneumoniae NOT DETECTED NOT DETECTED Final   Streptococcus pyogenes NOT DETECTED NOT DETECTED Final   A.calcoaceticus-baumannii NOT DETECTED NOT DETECTED Final   Bacteroides fragilis NOT DETECTED NOT DETECTED Final   Enterobacterales DETECTED (A) NOT DETECTED Final    Comment: Enterobacterales represent a large order of gram negative bacteria, not a single organism. CRITICAL RESULT CALLED TO, READ BACK BY AND VERIFIED WITH:  AMY THOMPSON AT 0840 02/22/21 SDR    Enterobacter cloacae complex NOT DETECTED NOT DETECTED Final   Escherichia coli DETECTED (A) NOT DETECTED Final    Comment: CRITICAL RESULT CALLED TO, READ BACK BY AND VERIFIED WITH:  AMY THOMPSON AT 0840 02/22/21 SDR    Klebsiella aerogenes NOT DETECTED NOT DETECTED Final   Klebsiella oxytoca NOT DETECTED NOT DETECTED Final   Klebsiella pneumoniae NOT DETECTED NOT DETECTED Final    Proteus species NOT DETECTED NOT DETECTED Final   Salmonella species NOT DETECTED  NOT DETECTED Final   Serratia marcescens NOT DETECTED NOT DETECTED Final   Haemophilus influenzae NOT DETECTED NOT DETECTED Final   Neisseria meningitidis NOT DETECTED NOT DETECTED Final   Pseudomonas aeruginosa NOT DETECTED NOT DETECTED Final   Stenotrophomonas maltophilia NOT DETECTED NOT DETECTED Final   Candida albicans NOT DETECTED NOT DETECTED Final   Candida auris NOT DETECTED NOT DETECTED Final   Candida glabrata NOT DETECTED NOT DETECTED Final   Candida krusei NOT DETECTED NOT DETECTED Final   Candida parapsilosis NOT DETECTED NOT DETECTED Final   Candida tropicalis NOT DETECTED NOT DETECTED Final   Cryptococcus neoformans/gattii NOT DETECTED NOT DETECTED Final   CTX-M ESBL NOT DETECTED NOT DETECTED Final   Carbapenem resistance IMP NOT DETECTED NOT DETECTED Final   Carbapenem resistance KPC NOT DETECTED NOT DETECTED Final   Carbapenem resistance NDM NOT DETECTED NOT DETECTED Final   Carbapenem resist OXA 48 LIKE NOT DETECTED NOT DETECTED Final   Carbapenem resistance VIM NOT DETECTED NOT DETECTED Final    Comment: Performed at [facility], [address] [place], [place] [address]  Urine culture     Status: Abnormal   Collection Time: 02/21/21  8:30 PM   Specimen: Urine, Random  Result Value Ref Range Status   Specimen Description   Final    URINE, RANDOM Performed at [facility], [address], [place], [place] [address]    Special Requests   Final    NONE Performed at [facility], [address], [place], [place] [address]    Culture (A)  Final    <10,000 COLONIES/mL INSIGNIFICANT GROWTH Performed at [facility], [address] [place], [place] [address]    Report Status 02/23/2021 FINAL  Final  Blood culture (routine x 2)     Status: Abnormal   Collection Time: 02/21/21  8:39 PM   Specimen: BLOOD  Result Value Ref Range Status   Specimen  Description   Final    BLOOD BLOOD RIGHT WRIST Performed at [facility], [address] [place], [place] [address]    Special Requests   Final    BOTTLES DRAWN AEROBIC AND ANAEROBIC Blood Culture adequate volume Performed at [facility], [address] [place], [place] [address]    Culture  Setup Time   Final    GRAM NEGATIVE RODS IN BOTH AEROBIC AND ANAEROBIC BOTTLES CRITICAL VALUE NOTED.  VALUE IS CONSISTENT WITH PREVIOUSLY REPORTED AND CALLED VALUE. Performed at [facility], [address] [place], [place] [address]    Culture (A)  Final    ESCHERICHIA COLI SUSCEPTIBILITIES PERFORMED ON PREVIOUS CULTURE WITHIN THE LAST 5 DAYS. Performed at [facility], [place] [address], [place], [place] [address]    Report Status 02/24/2021 FINAL  Final  Resp Panel by RT-PCR (Flu A&B, Covid) Nasopharyngeal Swab     Status: None   Collection Time: 02/21/21  8:39 PM   Specimen: Nasopharyngeal Swab; Nasopharyngeal(NP) swabs in vial transport medium  Result Value Ref Range Status   SARS Coronavirus 2 by RT PCR NEGATIVE NEGATIVE Final    Comment: (NOTE) SARS-CoV-2 target nucleic acids are NOT DETECTED.  The SARS-CoV-2 RNA is generally detectable in upper respiratory specimens during the acute phase of infection. The lowest concentration of SARS-CoV-2 viral copies this assay can detect is 138 copies/mL. A negative result does not preclude SARS-Cov-2 infection and should not be used as the sole basis for treatment or other patient management decisions. A  negative result may occur with  improper specimen collection/handling, submission of specimen other than nasopharyngeal swab, presence of viral mutation(s) within the areas targeted by this assay, and inadequate number of viral copies(<138 copies/mL). A negative result must be combined with clinical observations, patient history, and epidemiological information. The expected result is Negative.  Fact Sheet  for Patients:  EntrepreneurPulse.com.au  Fact Sheet for Healthcare Providers:  IncredibleEmployment.be  This test is no t yet approved or cleared by the [place] FDA and  has been authorized for detection and/or diagnosis of SARS-CoV-2 by FDA under an Emergency Use Authorization (EUA). This EUA will remain  in effect (meaning this test can be used) for the duration of the COVID-19 declaration under Section 564(b)(1) of the Act, 21 U.S.C.section 360bbb-3(b)(1), unless the authorization is terminated  or revoked sooner.       Influenza A by PCR NEGATIVE NEGATIVE Final   Influenza B by PCR NEGATIVE NEGATIVE Final    Comment: (NOTE) The Xpert Xpress SARS-CoV-2/FLU/RSV plus assay is intended as an aid in the diagnosis of influenza from Nasopharyngeal swab specimens and should not be used as a sole basis for treatment. Nasal washings and aspirates are unacceptable for Xpert Xpress SARS-CoV-2/FLU/RSV testing.  Fact Sheet for Patients: EntrepreneurPulse.com.au  Fact Sheet for Healthcare Providers: IncredibleEmployment.be  This test is not yet approved or cleared by the [place] FDA and has been authorized for detection and/or diagnosis of SARS-CoV-2 by FDA under an Emergency Use Authorization (EUA). This EUA will remain in effect (meaning this test can be used) for the duration of the COVID-19 declaration under Section 564(b)(1) of the Act, 21 U.S.C. section 360bbb-3(b)(1), unless the authorization is terminated or revoked.  Performed at [facility], [address] [place], [place] [address]   Surgical pcr screen     Status: Abnormal   Collection Time: 02/23/21  8:25 PM   Specimen: Nasal Mucosa; Nasal Swab  Result Value Ref Range Status   MRSA, PCR NEGATIVE NEGATIVE Final   Staphylococcus aureus POSITIVE (A) NEGATIVE Final    Comment: (NOTE) The Xpert SA Assay (FDA approved for NASAL  specimens in patients 37 years of age and older), is one component of a comprehensive surveillance program. It is not intended to diagnose infection nor to guide or monitor treatment. Performed at [facility], [place] [address] [place], [place] [address]      Studies: DG Abd Portable 1V  Result Date: 02/28/2021 CLINICAL DATA:  Ileus.  Pancreatitis.  Recent cholecystectomy EXAM: PORTABLE ABDOMEN - 1 VIEW COMPARISON:  CT abdomen and pelvis February 21, 2021; MR abdomen February 22, 2021 FINDINGS: Loops of mildly dilated small bowel noted without air-fluid levels. No free air evident on supine examination. Surgical clips noted in gallbladder fossa region. Surgical drain noted in the lateral right abdomen. IMPRESSION: Loops of mildly dilated small bowel without air-fluid levels likely represent postoperative ileus. Drain in lateral right mid abdomen. Surgical clips gallbladder fossa. No free air evident on supine examination. Electronically Signed   By: Lowella Grip III M.D.   On: 02/28/2021 15:59      Flora Lipps, MD  [facility] 03/01/2021  If 7PM-7AM, please contact night-coverage

## 2021-03-01 NOTE — Op Note (Signed)
Date: 03/01/21  Patient: Travis Gallagher MRN: [id number]  Preoperative Diagnosis: Duodenal perforation Postoperative Diagnosis: Same  Procedure: Exploratory laparotomy, primary repair of duodenal perforation with [name] patch  Surgeon: Sophronia Simas, MD Assistant: Emelia Loron, MD  EBL: 30 mL  Anesthesia: General  Specimens: None  Indications: Mr. Travis Gallagher is an 87 yo male who underwent a laparoscopic cholecystectomy 3 days ago for gangrenous cholecystitis. Today he had worsening abdominal pain, leukocytosis and acute kidney injury, and was noted to have bilious output from his surgical JP drain. CT scan with oral contrast showed extravasation from the duodenum. He was brought to the operating room emergently for exploration.  Findings: 0.5cm defect on the lateral aspect of the second portion of the duodenum, with healthy viable surrounding tissue. This was closed primarily and reinforced with an omental patch. Contained fluid collection in the pelvis.  Procedure details: Informed consent was obtained in the preoperative area prior to the procedure. The patient was brought to the operating room and placed on the table in the supine position. General anesthesia was induced and appropriate lines and drains were placed for intraoperative monitoring. Perioperative antibiotics were administered per SCIP guidelines. The abdomen was prepped and draped in the usual sterile fashion. A pre-procedure timeout was taken verifying patient identity, surgical site and procedure to be performed.  An upper midline skin incision was made and the subcutaneous tissue was divided with cautery. The fascia was incised and opened along the linea alba and the peritoneal cavity was entered. The falciform ligament was ligated with 2-0 silk ties and divided. A Bookwalter fixed retractor was placed. The omentum and duodenum were adherent to the gallbladder fossa, and were gently peeled off the  gallbladder fossa using blunt dissection. There was a contained collection of bilious fluid surrounding the JP drain. The drain was removed. A small full-thickness defect approximately 0.5cm in diameter was identified on the second portion of the duodenum on the anterolateral aspect. The edges of the defect were clean and healthy, and the surrounding tissue was viable. Because the defect was small with viable surrounding tissue we felt it was amenable to primary repair. The defect was closed with simple interrupted 3-0 Vicryl sutures. A tongue of omentum was then mobilized. A layer of 3-0 silk Lembert sutures was placed over the duodenal repair, and the omental tongue was layed over the repair and the silk sutures were tied down over the omentum to secure it in place. At the completion of the repair there was no visible leakage of bilious fluid. The gallbladder fossa was inspected. The clips remained in place on the cystic duct stump with no evidence of bile leak. A pelvic fluid collection had been identified on the CT scan, and this was entered in the RLQ using gentle finger dissection. It yielded bile-stained fluid and was completely drained. The small bowel was examined and there were no enterotomies identified. The abdomen was thoroughly irrigated with warmed saline. A 19-Fr fluted JP drain was brought onto the field and placed in the gallbladder fossa and adjacent to the duodenal repair. This was brought out through a new hole in the right lateral abdominal wall and secured to the skin with 2-0 Nylon. The nasogastric tube tip was palpated within the stomach. The retractors were removed. The fascia was closed with a running looped 1 PDS suture and the skin was closed with staples. A sterile dressing was applied.  The patient tolerated the procedure well with no apparent complications. All counts were correct x2  at the end of the procedure. The patient was extubated and taken to PACU in stable  condition.  Sophronia Simas, MD 03/01/21 9:11 PM

## 2021-03-01 NOTE — Anesthesia Postprocedure Evaluation (Signed)
Anesthesia Post Note  Patient: Travis Gallagher  Procedure(s) Performed: EXPLORATORY LAPAROTOMY - PRIMARY REPAIR OF DUODENAL PERFORATION WITH GRAHAM PATCH;  INSERTION OF 19 FR. DRAIN (N/A Abdomen)     Patient location during evaluation: PACU Anesthesia Type: General Level of consciousness: sedated Pain management: pain level controlled Vital Signs Assessment: post-procedure vital signs reviewed and stable Respiratory status: spontaneous breathing and respiratory function stable Cardiovascular status: stable Postop Assessment: no apparent nausea or vomiting Anesthetic complications: no   No complications documented.  Last Vitals:  Vitals:   03/01/21 2230 03/01/21 2242  BP: (!) 159/58 (!) 160/74  Pulse: 94 98  Resp: 20 (!) 22  Temp: 36.5 C   SpO2: 97% 96%    Last Pain:  Vitals:   03/01/21 2230  TempSrc: Axillary  PainSc: 5                  Tawnie Ehresman DANIEL

## 2021-03-01 NOTE — Progress Notes (Addendum)
Progress Note  3 Days Post-Op  Subjective: CC: he is having more abdominal pain today. He reports a BM with suppository early this am. He has not had nausea or emesis. He denies respiratory complaints. He has been ambulatory but admits not much given his pain. Son and daughter in law bedside   Objective: Vital signs in last 24 hours: Temp:  [97.7 F (36.5 C)-98.2 F (36.8 C)] 98.2 F (36.8 C) (05/04 1008) Pulse Rate:  [81-95] 81 (05/04 1008) Resp:  [16-17] 17 (05/04 1008) BP: (104-133)/(49-61) 133/60 (05/04 1008) SpO2:  [88 %-96 %] 96 % (05/04 1008) Weight:  [70.8 kg] 70.8 kg (05/04 0457) Last BM Date: 02/25/21  Intake/Output from previous day: 05/03 0701 - 05/04 0700 In: 1514.5 [P.O.:480; I.V.:618.2; IV Piggyback:416.3] Out: 515 [Urine:450; Drains:65] Intake/Output this shift: No intake/output data recorded.  PE: General: pleasant, WD, male who is laying in bed in NAD HEENT: head is normocephalic, atraumatic.  Sclera are noninjected. Mouth is pink and moist Heart: regular, rate, and rhythm. Palpable radial and pedal pulses bilaterally Lungs: CTAB, no wheezes, rhonchi, or rales noted.  Respiratory effort nonlabored Abd: soft, +BS, mildly distended. Tenderness and guarding to palpation over right lower quadrant. Drain with cloudy bilious output MS: all 4 extremities are symmetrical with no cyanosis, clubbing, or edema. No calf tenderness to palpation Skin: warm and dry with no masses, lesions, or rashes Neuro: Cranial nerves 2-12 grossly intact, sensation is normal throughout Psych: A&Ox3 with an appropriate affect.    Lab Results:  Recent Labs    02/28/21 0215 03/01/21 0242  WBC 11.9* 13.5*  HGB 12.5* 10.9*  HCT 37.4* 32.9*  PLT 177 181   BMET Recent Labs    02/28/21 0215 03/01/21 0242  NA 138 136  K 3.6 3.5  CL 108 108  CO2 21* 23  GLUCOSE 142* 117*  BUN 36* 50*  CREATININE 1.45* 2.40*  CALCIUM 7.3* 7.1*   PT/INR No results for input(s): LABPROT,  INR in the last 72 hours. CMP     Component Value Date/Time   NA 136 03/01/2021 0242   K 3.5 03/01/2021 0242   CL 108 03/01/2021 0242   CO2 23 03/01/2021 0242   GLUCOSE 117 (H) 03/01/2021 0242   BUN 50 (H) 03/01/2021 0242   CREATININE 2.40 (H) 03/01/2021 0242   CALCIUM 7.1 (L) 03/01/2021 0242   PROT 4.5 (L) 03/01/2021 0242   ALBUMIN 1.7 (L) 03/01/2021 0242   AST 59 (H) 03/01/2021 0242   ALT 29 03/01/2021 0242   ALKPHOS 203 (H) 03/01/2021 0242   BILITOT 0.8 03/01/2021 0242   GFRNONAA 25 (L) 03/01/2021 0242   Lipase     Component Value Date/Time   LIPASE 92 (H) 02/25/2021 0052       Studies/Results: DG Abd Portable 1V  Result Date: 02/28/2021 CLINICAL DATA:  Ileus.  Pancreatitis.  Recent cholecystectomy EXAM: PORTABLE ABDOMEN - 1 VIEW COMPARISON:  CT abdomen and pelvis February 21, 2021; MR abdomen February 22, 2021 FINDINGS: Loops of mildly dilated small bowel noted without air-fluid levels. No free air evident on supine examination. Surgical clips noted in gallbladder fossa region. Surgical drain noted in the lateral right abdomen. IMPRESSION: Loops of mildly dilated small bowel without air-fluid levels likely represent postoperative ileus. Drain in lateral right mid abdomen. Surgical clips gallbladder fossa. No free air evident on supine examination. Electronically Signed   By: Bretta Bang III M.D.   On: 02/28/2021 15:59    Anti-infectives: Anti-infectives (From admission,  onward)   Start     Dose/Rate Route Frequency Ordered Stop   02/26/21 1500  metroNIDAZOLE (FLAGYL) IVPB 500 mg        500 mg 100 mL/hr over 60 Minutes Intravenous Every 8 hours 02/26/21 1414     02/22/21 2000  cefTRIAXone (ROCEPHIN) 2 g in sodium chloride 0.9 % 100 mL IVPB        2 g 200 mL/hr over 30 Minutes Intravenous Daily at 10 pm 02/22/21 1339     02/22/21 1600  metroNIDAZOLE (FLAGYL) IVPB 500 mg  Status:  Discontinued        500 mg 100 mL/hr over 60 Minutes Intravenous Every 8 hours 02/22/21  1339 02/26/21 1414       Assessment/Plan  Elevated troponin- 32 >174 >112, EKG without ischemia changes,cards cleared for surgery E.coli, Enterobacterales bacteremia - IV abx per primary team   Acute biliary pancreatitis, suspect resolving ascending cholangitis, gangrenous cholecystitis  s/p lap chole with Dr. Dwain Sarna on 5/1 - POD#3 - operative findings of liver cysts and cirrhosis - will need PCP/GI follow up  - appears to have mild ileus - continue to mobilize, has had BM with suppository -  JP drain - 65 cc recorded out in last 24 hr - cloudy bilious concerning for duodenal leak. Will get CT scan with oral contrast - NPO  Interpretor services were used for the duration of interview and PE  FEN: NPO, LR+KCl 100 mL/hr ID: ceftriaxone, flagyl VTE: SCDs    LOS: 7 days    Eric Form, [facility] Surgery 03/01/2021, 10:28 AM Please see Amion for pager number during day hours 7:00am-4:30pm

## 2021-03-01 NOTE — Anesthesia Preprocedure Evaluation (Addendum)
Anesthesia Evaluation  Patient identified by MRN, date of birth, ID band Patient awake    Reviewed: Allergy & Precautions, NPO status , Patient's Chart, lab work & pertinent test results  History of Anesthesia Complications Negative for: history of anesthetic complications  Airway Mallampati: I  TM Distance: >3 FB Neck ROM: Full    Dental  (+) Edentulous Upper, Edentulous Lower   Pulmonary neg pulmonary ROS,    Pulmonary exam normal        Cardiovascular hypertension, Normal cardiovascular exam     Neuro/Psych negative neurological ROS  negative psych ROS   GI/Hepatic Neg liver ROS, GERD  ,  Endo/Other  negative endocrine ROS  Renal/GU negative Renal ROS     Musculoskeletal   Abdominal Normal abdominal exam  (+)   Peds  Hematology negative hematology ROS (+)   Anesthesia Other Findings   Reproductive/Obstetrics                            Anesthesia Physical  Anesthesia Plan  ASA: III and emergent  Anesthesia Plan: General   Post-op Pain Management:    Induction: Intravenous, Rapid sequence and Cricoid pressure planned  PONV Risk Score and Plan: 3 and Ondansetron, Dexamethasone and Treatment may vary due to age or medical condition  Airway Management Planned: Oral ETT  Additional Equipment: None  Intra-op Plan:   Post-operative Plan: Extubation in OR  Informed Consent: I have reviewed the patients History and Physical, chart, labs and discussed the procedure including the risks, benefits and alternatives for the proposed anesthesia with the patient or authorized representative who has indicated his/her understanding and acceptance.     Dental advisory given and Interpreter used for interveiw  Plan Discussed with: Anesthesiologist, CRNA and Surgeon  Anesthesia Plan Comments:        Anesthesia Quick Evaluation

## 2021-03-01 NOTE — Anesthesia Procedure Notes (Signed)
Procedure Name: Intubation Date/Time: 03/01/2021 7:34 PM Performed by: Molli Hazard, CRNA Pre-anesthesia Checklist: Patient identified, Emergency Drugs available, Suction available and Patient being monitored Patient Re-evaluated:Patient Re-evaluated prior to induction Oxygen Delivery Method: Circle system utilized Preoxygenation: Pre-oxygenation with 100% oxygen Induction Type: IV induction, Cricoid Pressure applied and Rapid sequence Laryngoscope Size: Miller and 2 Grade View: Grade I Tube type: Oral Tube size: 7.5 mm Number of attempts: 1 Airway Equipment and Method: Stylet Placement Confirmation: ETT inserted through vocal cords under direct vision,  positive ETCO2 and breath sounds checked- equal and bilateral Secured at: 22 cm Tube secured with: Tape Dental Injury: Teeth and Oropharynx as per pre-operative assessment

## 2021-03-01 NOTE — Progress Notes (Signed)
PT Cancellation Note  Patient Details Name: Travis Gallagher MRN: [id number] DOB: 1931/09/21   Cancelled Treatment:    Reason Eval/Treat Not Completed: (P) Pain limiting ability to participate;Medical issues which prohibited therapy (RN reports to defer PT tx at this time based on pain and need to transfer to CT for imaging, will follow up per POC.)   Giorgio Chabot Artis Delay 03/01/2021, 3:41 PM  Bonney Leitz , PTA Acute Rehabilitation Services Pager [phone] Office [phone]

## 2021-03-01 NOTE — Progress Notes (Signed)
Pharmacy Antibiotic Note  Travis Gallagher is a 87 y.o. male admitted on 02/22/2021 with abdominal pain and acute pancreatitis.  Pt had lap chole on 02/26/21.  Pt with more main, abd distention and tenderness today. Pt underwent CT scan this afternoon, which showed perforation of proximal duodenum. Pharmacy has been consulted for Zosyn dosing for intra-abdominal infection/duodenal leak.  Pt has been receiving ceftriaxone since 4/27 (completed 7 doses today) and metronidazole IV (scheduled to end tomorrow AM).  WBC up to 13.5, afebrile; Scr up to 2.40 (CrCl 20.3 ml/min; renal function unstable and worsening)  Plan: D/C metronidazole Zosyn 3.375 gm IV Q 8 hrs (extended infusion) Since Scr trending up, will repeat Scr this afternoon to see if Zosyn adjustment needed Monitor WBC, temp, clinical improvement, renal function  Weight: 70.8 kg (156 lb 1.4 oz)  Temp (24hrs), Avg:98 F (36.7 C), Min:97.7 F (36.5 C), Max:98.2 F (36.8 C)  Recent Labs  Lab 02/23/21 0126 02/24/21 0455 02/27/21 0048 02/28/21 0215 03/01/21 0242  WBC 7.6 9.5 8.4 11.9* 13.5*  CREATININE 1.27* 1.07 1.11 1.45* 2.40*    Estimated Creatinine Clearance: 20.3 mL/min (A) (by C-G formula based on SCr of 2.4 mg/dL (H)).    No Known Allergies  Antimicrobials this admission: Ceftriaxone: 4/27-5/4 Metronidazole IV: 4/27-5/4 Zosyn: 5/4 >>  Microbiology results: 4/26 BCx X 2: E coli, pan sensitive 4/26 UCx: <10,000 colonies/ml insignificant growth 4/28 MRSA PCR: negative 4/28 Staph aureus (nasal swab): positive 4/26 COVID, flu A, flu B: negative  Thank you for allowing pharmacy to be a part of this patient's care.  Vicki Mallet, PharmD, BCPS, [facility] Clinical Pharmacist 03/01/2021 5:19 PM

## 2021-03-02 ENCOUNTER — Inpatient Hospital Stay (HOSPITAL_COMMUNITY): Payer: Self-pay

## 2021-03-02 ENCOUNTER — Inpatient Hospital Stay: Payer: Self-pay

## 2021-03-02 ENCOUNTER — Encounter (HOSPITAL_COMMUNITY): Payer: Self-pay | Admitting: Surgery

## 2021-03-02 DIAGNOSIS — K631 Perforation of intestine (nontraumatic): Secondary | ICD-10-CM

## 2021-03-02 LAB — BASIC METABOLIC PANEL
Anion gap: 7 (ref 5–15)
BUN: 44 mg/dL — ABNORMAL HIGH (ref 8–23)
CO2: 20 mmol/L — ABNORMAL LOW (ref 22–32)
Calcium: 7.4 mg/dL — ABNORMAL LOW (ref 8.9–10.3)
Chloride: 110 mmol/L (ref 98–111)
Creatinine, Ser: 1.5 mg/dL — ABNORMAL HIGH (ref 0.61–1.24)
GFR, Estimated: 45 mL/min — ABNORMAL LOW (ref >60)
Glucose, Bld: 127 mg/dL — ABNORMAL HIGH (ref 70–99)
Potassium: 4 mmol/L (ref 3.5–5.1)
Sodium: 137 mmol/L (ref 135–145)

## 2021-03-02 LAB — CBC
HCT: 32.8 % — ABNORMAL LOW (ref 39.0–52.0)
Hemoglobin: 10.9 g/dL — ABNORMAL LOW (ref 13.0–17.0)
MCH: 29.6 pg (ref 26.0–34.0)
MCHC: 33.2 g/dL (ref 30.0–36.0)
MCV: 89.1 fL (ref 80.0–100.0)
Platelets: 194 10*3/uL (ref 150–400)
RBC: 3.68 MIL/uL — ABNORMAL LOW (ref 4.22–5.81)
RDW: 13.5 % (ref 11.5–15.5)
WBC: 14.4 10*3/uL — ABNORMAL HIGH (ref 4.0–10.5)
nRBC: 0 % (ref 0.0–0.2)

## 2021-03-02 MED ORDER — BOOST / RESOURCE BREEZE PO LIQD CUSTOM: 1.0000 | Freq: Three times a day (TID) | ORAL | Status: DC

## 2021-03-02 MED ORDER — MENTHOL 3 MG MT LOZG
1.0000 | LOZENGE | OROMUCOSAL | Status: DC | PRN
  Filled 2021-03-02: qty 9

## 2021-03-02 MED ORDER — ENSURE ENLIVE PO LIQD: 237.0000 mL | Freq: Three times a day (TID) | ORAL | Status: DC

## 2021-03-02 MED ORDER — PHENOL 1.4 % MT LIQD
1.0000 | OROMUCOSAL | Status: DC | PRN
  Administered 2021-03-03: 1 via OROMUCOSAL
  Filled 2021-03-02: qty 177

## 2021-03-02 MED ORDER — LACTATED RINGERS IV SOLN: INTRAVENOUS | Status: AC

## 2021-03-02 MED ORDER — SODIUM CHLORIDE 0.9% FLUSH
10.0000 mL | Freq: Two times a day (BID) | INTRAVENOUS | Status: DC
  Administered 2021-03-02: 40 mL
  Administered 2021-03-03 – 2021-03-07 (×8): 10 mL
  Administered 2021-03-08: 40 mL
  Administered 2021-03-08 – 2021-03-14 (×12): 10 mL

## 2021-03-02 MED ORDER — ADULT MULTIVITAMIN W/MINERALS CH: 1.0000 | ORAL_TABLET | Freq: Every day | ORAL | Status: DC

## 2021-03-02 MED ORDER — SODIUM CHLORIDE 0.9% FLUSH
10.0000 mL | INTRAVENOUS | Status: DC | PRN
  Administered 2021-03-06 – 2021-03-07 (×2): 10 mL

## 2021-03-02 NOTE — Significant Event (Signed)
Rapid Response Event Note   Reason for Call :  Inability to get NG tube in  Initial Focused Assessment:  Alert, spanish speaking.  COnfused at times but oriented at this time.  Used interpreter to speak with patient who reported he has an obstruction in his right nare and to use left nare.  States "he will leave it in and do whatever they are asking of him".    Patient complains of abd pain and advised via interpreter the tube will help relieve pain and pressure.    Interventions:  Placed 65fr NG tube in left nare and listened over epigastric for bubbles with toomey syringe.  CXR for tube verification Plan of Care:  Remain on unit and verify tube placement.    Event Summary:   MD Notified: n/a Call Time: 0200 Arrival Time: 7416  End Time: 3845  Samuel Bouche, RN

## 2021-03-02 NOTE — Progress Notes (Signed)
NG tube 14 Fr.replaced on left nare by Magan RRT.  NG tube position confirmed by x-ray. Pt tolerated well. NG tube connected with low intermittent suction. No drainage appeared. He's hemodynamically stable. We will monitor.  Filiberto Pinks, RN

## 2021-03-02 NOTE — Progress Notes (Signed)
Notified Dr. Julian Reil for Pt's diet status clarification. Pt has been NPO with NG tube intermittent low suction. We continue to keep Pt NPO and start TPN at am tomorrow per pharmacist recommendation. Ensure and Boots supplement will be discontinued until NG tube removed on 5/9. Gastric content appears dark red with bile 100 ml on day shift.  Dr Julian Reil ordered for LR at 125 ml/hr IV.  Pt's hemodynamically stable. Continue to monitor.  Filiberto Pinks, RN

## 2021-03-02 NOTE — Progress Notes (Signed)
Physical Therapy Treatment Patient Details Name: Travis Gallagher MRN: [id number] DOB: [birth year] Today's Date: 03/02/2021    History of Present Illness 87 y.o. M admitted 02/22/2021 with gallstone pancreatitis, underwent laparoscopic cholecystectomy with gangrenous cholecystitis. Also found to have LV dysfunction with mild-moderate mitral regurgitation, PSVT, elevated troponin. On 5/4 pt found to have duodenal perforation on CT, underwent ex-lap with repair of duodenal perf with Travis Gallagher patch. Significant PMH: HTN, GERD, Visiting from [place].    PT Comments    PT updates goals and POC based on pt's current functional level after duodenal perforation repair on 5/4. Pt currently requires physical assistance to perform bed mobility and transfers due to generalized weakness and pain. Pt activity tolerance remains limited at this time. Pt will benefit from frequent aggressive mobilization to improve activity tolerance and to restore independence in all mobility. PT continues to recommend discharge home with HHPT services. Pt is uninsured so outpatient PT or pro bono PT may need to be considered.  Follow Up Recommendations  Home health PT;Supervision for mobility/OOB     Equipment Recommendations  Rolling walker with 5" wheels;3in1 (PT)    Recommendations for Other Services       Precautions / Restrictions Precautions Precautions: Fall Precaution Comments: JP drain, NG tube Restrictions Weight Bearing Restrictions: No    Mobility  Bed Mobility Overal bed mobility: Needs Assistance Bed Mobility: Supine to Sit;Sit to Supine     Supine to sit: Min assist;HOB elevated Sit to supine: Min assist;HOB elevated   General bed mobility comments: PT attempts to cue for log roll however pt does not follow these cues, instead sits into long sitting requiring assistance to elevate trunk    Transfers Overall transfer level: Needs assistance Equipment used: Rolling walker (2  wheeled) Transfers: Sit to/from Stand Sit to Stand: Min guard         General transfer comment: verbal cues for anterior trunk lean  Ambulation/Gait Ambulation/Gait assistance: Min guard Gait Distance (Feet): 12 Feet (3' forward and backward twice) Assistive device: Rolling walker (2 wheeled) Gait Pattern/deviations: Step-to pattern Gait velocity: reduced Gait velocity interpretation: <1.31 ft/sec, indicative of household ambulator General Gait Details: pt with shortened step-to gait, mild posterior loss of balance with backward stepping initially   Stairs             Wheelchair Mobility    Modified Rankin (Stroke Patients Only)       Balance Overall balance assessment: Needs assistance Sitting-balance support: No upper extremity supported;Feet supported Sitting balance-Leahy Scale: Fair     Standing balance support: Single extremity supported;Bilateral upper extremity supported Standing balance-Leahy Scale: Poor Standing balance comment: reliant on external support                            Cognition Arousal/Alertness: Awake/alert Behavior During Therapy: WFL for tasks assessed/performed Overall Cognitive Status: Within Functional Limits for tasks assessed                                        Exercises      General Comments General comments (skin integrity, edema, etc.): pt on 2L supplemental oxygen via face mask, without mask over nose pt sats 90-91%, with sats in mid 90s at rest with face mask on. Otherwise VSS      Pertinent Vitals/Pain Pain Assessment: 0-10 Pain Score: 8  Pain Location: abdomen  Pain Descriptors / Indicators: Sore Pain Intervention(s): Monitored during session    Home Living                      Prior Function            PT Goals (current goals can now be found in the care plan section) Acute Rehab PT Goals Patient Stated Goal: get better Progress towards PT goals: Progressing toward  goals    Frequency    Min 3X/week      PT Plan Current plan remains appropriate;Equipment recommendations need to be updated    Co-evaluation              AM-PAC PT "6 Clicks" Mobility   Outcome Measure  Help needed turning from your back to your side while in a flat bed without using bedrails?: A Little Help needed moving from lying on your back to sitting on the side of a flat bed without using bedrails?: A Little Help needed moving to and from a bed to a chair (including a wheelchair)?: A Little Help needed standing up from a chair using your arms (e.g., wheelchair or bedside chair)?: A Little Help needed to walk in hospital room?: A Little Help needed climbing 3-5 steps with a railing? : A Lot 6 Click Score: 17    End of Session   Activity Tolerance: Patient limited by fatigue;Patient limited by pain Patient left: in bed;with call bell/phone within reach;with bed alarm set;with SCD's reapplied Nurse Communication: Mobility status PT Visit Diagnosis: Unsteadiness on feet (R26.81);Muscle weakness (generalized) (M62.81);Difficulty in walking, not elsewhere classified (R26.2);Pain Pain - part of body:  (abdomen)     Time: 7035-0093 PT Time Calculation (min) (ACUTE ONLY): 29 min  Charges:  $Therapeutic Activity: 8-22 mins                     Arlyss Gandy, PT, DPT Acute Rehabilitation Pager: [phone]    Arlyss Gandy 03/02/2021, 3:33 PM

## 2021-03-02 NOTE — Progress Notes (Signed)
Occupational Therapy Treatment Patient Details Name: Travis Gallagher MRN: [id number] DOB: 1931-10-07 Today's Date: 03/02/2021    History of present illness Pt is a 87 y.o. M admitted with gallstone pancreatitis, underwent laparoscopic cholecystectomy with gangrenous cholecystitis. Also found to have LV dysfunction with mild-moderate mitral regurgitation, PSVT, elevated troponin. Significant PMH: HTN, GERD, Visiting from [place].   OT comments  Pt. Was seen for skilled OT to increase I and safety with ADLs and mobiltiy.Pt. needs further OT to maximize I and safety. Pt. Was cooperative during session. Used tele interpreter during session  Follow Up Recommendations  Home health OT    Equipment Recommendations  3 in 1 bedside commode;Other (comment)    Recommendations for Other Services      Precautions / Restrictions Precautions Precautions: Fall Restrictions Weight Bearing Restrictions: No       Mobility Bed Mobility Overal bed mobility: Needs Assistance Bed Mobility: Supine to Sit;Sit to Supine     Supine to sit: Mod assist Sit to supine: Mod assist        Transfers Overall transfer level: Needs assistance Equipment used: Rolling walker (2 wheeled) Transfers: Sit to/from Stand Sit to Stand: Min assist              Balance     Sitting balance-Leahy Scale: Fair       Standing balance-Leahy Scale: Poor                             ADL either performed or assessed with clinical judgement   ADL Overall ADL's : Needs assistance/impaired         Upper Body Bathing: Min guard;Sitting   Lower Body Bathing: Moderate assistance;Sitting/lateral leans;Sit to/from stand   Upper Body Dressing : Min guard;Sitting   Lower Body Dressing: Moderate assistance;Sitting/lateral leans;Sit to/from stand   Toilet Transfer: Minimal assistance;Regular Toilet;RW;Stand-pivot           Functional mobility during ADLs: Minimal assistance;Rolling  walker;Min guard General ADL Comments: Pt. sat EOB for ADLs.     Vision       Perception     Praxis      Cognition Arousal/Alertness: Awake/alert Behavior During Therapy: WFL for tasks assessed/performed Overall Cognitive Status: Within Functional Limits for tasks assessed                                          Exercises Other Exercises Other Exercises: B shoulder elevation in sitting x 20 reps.   Shoulder Instructions       General Comments      Pertinent Vitals/ Pain       Pain Assessment: 0-10 Pain Score: 6  Pain Location: throat Pain Descriptors / Indicators: Sore Pain Intervention(s): Monitored during session;RN gave pain meds during session  Home Living Family/patient expects to be discharged to:: Private residence Living Arrangements: Children                                      Prior Functioning/Environment              Frequency  Min 2X/week        Progress Toward Goals  OT Goals(current goals can now be found in the care plan section)     Acute Rehab OT  Goals Patient Stated Goal: get better OT Goal Formulation: With patient/family Time For Goal Achievement: 02/28/21 Potential to Achieve Goals: Good ADL Goals Pt Will Perform Grooming: with supervision;standing Pt Will Perform Upper Body Dressing: with modified independence;sitting Pt Will Perform Lower Body Dressing: with modified independence;with adaptive equipment;sit to/from stand;sitting/lateral leans Pt Will Transfer to Toilet: with modified independence;ambulating;regular height toilet Additional ADL Goal #1: Pt will tolerate standing for 5 mins to complete self care activities at the sink  Plan Discharge plan remains appropriate    Co-evaluation                 AM-PAC OT "6 Clicks" Daily Activity     Outcome Measure   Help from another person eating meals?: A Little Help from another person taking care of personal grooming?: A  Little Help from another person toileting, which includes using toliet, bedpan, or urinal?: A Little Help from another person bathing (including washing, rinsing, drying)?: A Lot Help from another person to put on and taking off regular upper body clothing?: A Little Help from another person to put on and taking off regular lower body clothing?: A Lot 6 Click Score: 16    End of Session Equipment Utilized During Treatment: Rolling walker  OT Visit Diagnosis: Unsteadiness on feet (R26.81);Other abnormalities of gait and mobility (R26.89);Muscle weakness (generalized) (M62.81)   Activity Tolerance Patient limited by fatigue;Patient limited by pain   Patient Left in bed;with call bell/phone within reach;with bed alarm set   Nurse Communication  (ok therapy)        Time: 1423-9532 OT Time Calculation (min): 43 min  Charges: OT General Charges $OT Visit: 1 Visit OT Treatments $Self Care/Home Management : 38-52 mins  Derrek Gu OT/L    Zona Pedro 03/02/2021, 12:32 PM

## 2021-03-02 NOTE — Progress Notes (Signed)
Initial Nutrition Assessment  DOCUMENTATION CODES:  Not applicable  INTERVENTION:  Start TPN per pharmacy.  Advance diet as tolerated and as medically able.  Once diet is advanced to clear liquids, add Boost Breeze po TID, each supplement provides 250 kcal and 9 grams of protein.  Once diet is advanced to full liquids, discontinue Boost Breeze and add Ensure Enlive po TID, each supplement provides 350 kcal and 20 grams of protein.  Add MVI with minerals daily.  NUTRITION DIAGNOSIS:  Inadequate oral intake related to inability to eat as evidenced by NPO status.  GOAL:  Patient will meet greater than or equal to 90% of their needs  MONITOR:  Diet advancement,Weight trends,Skin,Labs,I & O's  REASON FOR ASSESSMENT:  NPO/Clear Liquid Diet    ASSESSMENT:  87 yo male with a PMH of HTN and GERD who presents with acute pancreatitis. 5/2 - lap cholecystectomy for gangrenous cholecystitis 5/4 - AKI, CT showed extravasation from the duodenum - brought to the operating room emergently for exploration 5/5 - NGT placed for onset of abdominal pain  Spoke with pt via iPad interpreter.   Pt reports wanting a meal. He has not had very much and is aware he is NPO currently. He reports eating well prior to his admission at the hospital - unable to elaborate on what. Pt speaking rather quietly and interpreter had a difficult time understanding, despite iPad being directly at bedside.  Pt has not noticed any weight changes recently. Per Epic, pt has gained about 8 kg in the past 10 days. This is questionable. Likely closer to admit weight of 141 lbs - RD to use this wt when calculating needs.  On exam, pt had no remarkable depletions that are not from age. However, given pt's prolonged NPO/clear liquid diet status, pt at risk for malnutrition.  Recommend advancing diet as medically able as soon as possible - secure chatted MD regarding plan for diet advancement. MD reports that pt will be on NGT  for a few days per surgery. RD to recommend TPN per pharmacy.  RD to add Boost Breeze TID once moved to clears and then Ensure TID once on full liquids to promote caloric and protein intake. Also recommend MVI with minerals daily.  Medications: IVF @ 10 ml/hr, LR w/ KCl 20 mEq @ 100 ml/hr, Zosyn Labs: reviewed; Glucose 127, serum Ca 7.4  NUTRITION - FOCUSED PHYSICAL EXAM: Flowsheet Row Most Recent Value  Orbital Region Mild depletion  Upper Arm Region No depletion  Thoracic and Lumbar Region No depletion  Buccal Region Mild depletion  Temple Region Mild depletion  Clavicle Bone Region No depletion  Clavicle and Acromion Bone Region No depletion  Scapular Bone Region No depletion  Dorsal Hand No depletion  Patellar Region No depletion  Anterior Thigh Region No depletion  Posterior Calf Region No depletion  Edema (RD Assessment) None  Hair Reviewed  Eyes Reviewed  Mouth Reviewed  Skin Reviewed  Nails Reviewed     Diet Order:   Diet Order            Diet NPO time specified Except for: Ice Chips  Diet effective now                EDUCATION NEEDS:  No education needs have been identified at this time  Skin:  Skin Assessment: Skin Integrity Issues: Skin Integrity Issues:: Incisions Incisions: Abdomen x 5, closed  Last BM:  03/01/21 - Type 6, yellow in color, smear  Height:  Ht  Readings from Last 1 Encounters:  02/21/21 5\' 7"  (1.702 m)   Weight:  Wt Readings from Last 1 Encounters:  03/02/21 70.8 kg   Ideal Body Weight:  67.3 kg  BMI:  Body mass index is 24.45 kg/m.  Estimated Nutritional Needs:  Kcal:  1900-2100 Protein:  90-105 grams Fluid:  >1.9 L  05/02/21, RD, LDN Registered Dietitian After Hours/Weekend Pager # [phone] [phone]

## 2021-03-02 NOTE — Progress Notes (Signed)
PHARMACY - TOTAL PARENTERAL NUTRITION CONSULT NOTE  Indication: Prolonged ileus  Patient Measurements: Weight: 70.8 kg (156 lb 1.4 oz)   Body mass index is 24.45 kg/m.  Assessment:  18 YOM presented to [facility] with abdominal pain, found to have acute pancreatitis in setting of choledocholithiasis and transferred to [facility] on 02/22/21.  Underwent ex-lap for gallstone pancreatitis on 5/1.  Had contrast extravasation from proximal duodenum with bilious output and underwent ex-lap with repair of duodenal perforation with Phillip Heal patch on 5/4.  Patient has inadequate nutrition for 9 days and Pharmacy consulted to dose TPN.  Patient has been on a clear liquid diet with intermittent NPO status.  Diet advanced to soft post-surgery on 5/2, but soon reverted to NPO for second surgery on 5/4.  Per documentation, no recent weight changes and patient was eating well PTA.  Glucose / Insulin: no hx DM - AM glucose < 180 Electrolytes: all WNL Renal: SCr 1.5 Hepatic: alk phos up 203, AST 59, tbili normalized, lipase down to 92 Intake / Output; MIVF: NS at [facility] GI Imaging: none since TPN start GI Surgeries / Procedures: none since TPN start  Central access: PICC ordered TPN start date: 03/03/21  Nutritional Goals (per RD rec on 5/5): 1900-2100 kCal, 90-105g AA and >1.9L fluid per day  Current Nutrition:  NPO Boost BID Ensure Enlive BID  Plan:  TPN consult received after hour, will initiate 03/03/21 Standard TPN labs and nursing care orders  Hala Narula D. Mina Marble, PharmD, BCPS, [place] 03/02/2021, 3:55 PM

## 2021-03-02 NOTE — Progress Notes (Signed)
Peripherally Inserted Central Catheter Placement  The IV Nurse has discussed with the patient and/or persons authorized to consent for the patient, the purpose of this procedure and the potential benefits and risks involved with this procedure.  The benefits include less needle sticks, lab draws from the catheter, and the patient may be discharged home with the catheter. Risks include, but not limited to, infection, bleeding, blood clot (thrombus formation), and puncture of an artery; nerve damage and irregular heartbeat and possibility to perform a PICC exchange if needed/ordered by physician.  Alternatives to this procedure were also discussed.  Bard Power PICC patient education guide, fact sheet on infection prevention and patient information card has been provided to patient /or left at bedside.    PICC Placement Documentation  PICC Double Lumen 03/02/21 PICC Right Basilic 37 cm 0 cm (Active)  Indication for Insertion or Continuance of Line Administration of hyperosmolar/irritating solutions (i.e. TPN, Vancomycin, etc.) 03/02/21 1840  Exposed Catheter (cm) 0 cm 03/02/21 1840  Site Assessment Clean;Dry;Intact 03/02/21 1840  Lumen #1 Status Flushed;Blood return noted 03/02/21 1840  Lumen #2 Status Flushed;Blood return noted 03/02/21 1840  Dressing Type Transparent 03/02/21 1840  Dressing Status Clean;Dry;Intact 03/02/21 1840  Antimicrobial disc in place? Yes 03/02/21 1840  Dressing Intervention New dressing;Other (Comment) 03/02/21 1840  Dressing Change Due 03/09/21 03/02/21 1840   Consent signed by son at bedside with spanish interpreter    Reginia Forts Albarece 03/02/2021, 6:41 PM

## 2021-03-02 NOTE — Progress Notes (Addendum)
Progress Note  1 Day Post-Op  Subjective: CC: He is having throat pain secondary to his NGT. Abdominal pain is very well controlled except with movement. He is on supplemental O2 but denies SHOB. He denies nausea/emesis  JP drain 120 mL  Objective: Vital signs in last 24 hours: Temp:  [97.6 F (36.4 C)-98.4 F (36.9 C)] 98.3 F (36.8 C) (05/05 0834) Pulse Rate:  [77-98] 79 (05/05 0834) Resp:  [16-22] 18 (05/05 0834) BP: (116-169)/(55-77) 145/69 (05/05 0834) SpO2:  [84 %-98 %] 96 % (05/05 0834) Weight:  [70.8 kg] 70.8 kg (05/05 0514) Last BM Date: 03/01/21  Intake/Output from previous day: 05/04 0701 - 05/05 0700 In: 2879.6 [I.V.:1976; IV Piggyback:903.6] Out: 975 [Urine:825; Drains:120; Blood:30] Intake/Output this shift: Total I/O In: -  Out: 10 [Drains:10]  PE: General: pleasant, WD, male who is laying in bed in NAD HEENT: head is normocephalic, atraumatic.  Sclera are noninjected. Mouth is pink and moist Heart: regular, rate, and rhythm. Palpable radial and pedal pulses bilaterally Lungs: CTAB, no wheezes, rhonchi, or rales noted.  Respiratory effort nonlabored on supplemental O2 via simple mask Abd: soft, +BS, mildly distended. Expected mild tenderness to palpation diffusely. Midline staples visible through honeycomb bandage - intact without erythema or discharge. JP with scant bloody output MS: all 4 extremities are symmetrical with no cyanosis, clubbing, or edema. No calf tenderness to palpation Skin: warm and dry with no masses, lesions, or rashes Neuro: Cranial nerves 2-12 grossly intact, sensation is normal throughout Psych: A&Ox3 with an appropriate affect.    Lab Results:  Recent Labs    03/01/21 0242 03/02/21 0205  WBC 13.5* 14.4*  HGB 10.9* 10.9*  HCT 32.9* 32.8*  PLT 181 194   BMET Recent Labs    03/01/21 0242 03/02/21 0205  NA 136 137  K 3.5 4.0  CL 108 110  CO2 23 20*  GLUCOSE 117* 127*  BUN 50* 44*  CREATININE 2.40* 1.50*  CALCIUM  7.1* 7.4*   PT/INR No results for input(s): LABPROT, INR in the last 72 hours. CMP     Component Value Date/Time   NA 137 03/02/2021 0205   K 4.0 03/02/2021 0205   CL 110 03/02/2021 0205   CO2 20 (L) 03/02/2021 0205   GLUCOSE 127 (H) 03/02/2021 0205   BUN 44 (H) 03/02/2021 0205   CREATININE 1.50 (H) 03/02/2021 0205   CALCIUM 7.4 (L) 03/02/2021 0205   PROT 4.5 (L) 03/01/2021 0242   ALBUMIN 1.7 (L) 03/01/2021 0242   AST 59 (H) 03/01/2021 0242   ALT 29 03/01/2021 0242   ALKPHOS 203 (H) 03/01/2021 0242   BILITOT 0.8 03/01/2021 0242   GFRNONAA 45 (L) 03/02/2021 0205   Lipase     Component Value Date/Time   LIPASE 92 (H) 02/25/2021 0052       Studies/Results: CT ABDOMEN PELVIS WO CONTRAST  Result Date: 03/01/2021 CLINICAL DATA:  Abdominal pain, fever, recent cholecystectomy EXAM: CT ABDOMEN AND PELVIS WITHOUT CONTRAST TECHNIQUE: Multidetector CT imaging of the abdomen and pelvis was performed following the standard protocol without IV contrast. COMPARISON:  02/22/2021, 02/21/2021 FINDINGS: Lower chest: There are small bilateral pleural effusions with compressive lower lobe atelectasis. Unenhanced CT was performed per clinician order. Lack of IV contrast limits sensitivity and specificity, especially for evaluation of abdominal/pelvic solid viscera. Hepatobiliary: Postsurgical changes are seen from cholecystectomy. There is a 3.7 x 3.2 cm fluid collection in the gallbladder fossa adjacent to the cholecystectomy clips. Given recent surgical intervention, this could reflect  postoperative hematoma or seroma. Evaluation is limited without IV contrast. There is a surgical drain traversing the cholecystectomy site. Extravasated oral contrast is seen along the course of the surgical drain, compatible with perforated duodenum. Please refer to discussion below. Numerous hepatic cysts are identified. No intrahepatic duct dilation. Dilation of the common bile duct measuring 16 mm compatible with  cholecystectomy. No evidence of choledocholithiasis. Pancreas: Unremarkable. No pancreatic ductal dilatation or surrounding inflammatory changes. Spleen: Normal in size.  Stable splenic cyst. Adrenals/Urinary Tract: Mild bilateral renal cortical atrophy. No urinary tract calculi or obstructive uropathy. The adrenals and bladder are unremarkable. Stomach/Bowel: As noted above, there is extravasation of oral contrast along the right lateral margin of the proximal duodenum, reference image 41/3, consistent with duodenal perforation. Oral contrast is seen extending into the right upper quadrant along the course of the indwelling surgical drain. There is no evidence of bowel obstruction. Scattered gas fluid levels are seen throughout the colon, nonspecific. 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 Diffuse atherosclerosis. 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 Reproductive: Prostate is unremarkable. Other: There is a loculated area of fluid within the right lower quadrant measuring 7.4 by 5.6 by 8.3 cm. Fluid is low-attenuation, without any evidence of secondary infection on this limited unenhanced exam. There is a small amount of pneumoperitoneum throughout the upper abdomen, likely a combination of postoperative change, indwelling surgical drain, and the perforated duodenum as described above. No abdominal wall hernia. Body wall edema is identified greatest in the bilateral flanks. Musculoskeletal: There are no acute or destructive bony lesions. Reconstructed images demonstrate no additional findings. IMPRESSION: 1. Perforation of the proximal duodenum, with extravasated oral contrast in the right upper quadrant. 2. Minimal pneumoperitoneum within the upper abdomen, consistent with recent surgical intervention, internal drain, and perforated duodenum as described above. Favor postoperative seroma or  hematoma. 3. Postsurgical changes from cholecystectomy, with indwelling surgical drain. Small fluid collection in the gallbladder fossa is nonspecific on this unenhanced exam. 4. Loculated simple appearing fluid in the right lower quadrant, nonspecific. No internal gas or septations identified on this limited unenhanced exam. 5. No evidence of bowel obstruction or ileus. 6. 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 Recommend follow-up ultrasound every 3 years. This recommendation follows ACR consensus guidelines: White Paper of the ACR Incidental Findings Committee II on Vascular Findings. J Am Coll Radiol 2013; 10:789-794. 7. Bilateral pleural effusions with compressive atelectasis of the bilateral lower lobes. Critical Value/emergent results were called by telephone at the time of interpretation on 03/01/2021 at 4:39 pm to provider [facility], who verbally acknowledged these results. Electronically Signed   By: Sharlet Salina M.D.   On: 03/01/2021 16:45   DG Chest Portable 1 View  Result Date: 03/02/2021 CLINICAL DATA:  Nasogastric tube insertion EXAM: PORTABLE CHEST 1 VIEW COMPARISON:  02/21/2021 FINDINGS: Enteric tube with tip and side-port over the stomach. Postoperative right upper quadrant with drain. Worsening chest with hazy density on the right more than left. No superimposed Kerley lines or pneumothorax. Normal heart size. IMPRESSION: 1. New enteric tube in good position. 2. Low volume chest with atelectasis and pleural effusions. Electronically Signed   By: Marnee Spring M.D.   On: 03/02/2021 04:06   DG Abd Portable 1V  Result Date: 02/28/2021 CLINICAL DATA:  Ileus.  Pancreatitis.  Recent cholecystectomy EXAM: PORTABLE ABDOMEN - 1 VIEW COMPARISON:  CT abdomen and pelvis February 21, 2021; MR abdomen February 22, 2021 FINDINGS: Loops of mildly dilated small bowel noted without air-fluid levels. No free air evident on supine examination. Surgical  clips noted in gallbladder fossa region. Surgical drain noted in the lateral right abdomen. IMPRESSION: Loops of mildly dilated small bowel without air-fluid levels likely represent postoperative ileus. Drain in lateral right mid abdomen. Surgical clips gallbladder fossa. No free air evident on supine examination. Electronically Signed   By: Bretta Bang III M.D.   On: 02/28/2021 15:59    Anti-infectives: Anti-infectives (From admission, onward)   Start     Dose/Rate Route Frequency Ordered Stop   03/01/21 1800  piperacillin-tazobactam (ZOSYN) IVPB 3.375 g        3.375 g 12.5 mL/hr over 240 Minutes Intravenous Every 8 hours 03/01/21 1732     02/26/21 1500  metroNIDAZOLE (FLAGYL) IVPB 500 mg  Status:  Discontinued        500 mg 100 mL/hr over 60 Minutes Intravenous Every 8 hours 02/26/21 1414 03/01/21 1732   02/22/21 2000  cefTRIAXone (ROCEPHIN) 2 g in sodium chloride 0.9 % 100 mL IVPB  Status:  Discontinued        2 g 200 mL/hr over 30 Minutes Intravenous Daily at 10 pm 02/22/21 1339 03/01/21 1716   02/22/21 1600  metroNIDAZOLE (FLAGYL) IVPB 500 mg  Status:  Discontinued        500 mg 100 mL/hr over 60 Minutes Intravenous Every 8 hours 02/22/21 1339 02/26/21 1414       Assessment/Plan  Elevated troponin- 32 >174 >112, EKG without ischemia changes,cards cleared for surgery E.coli, Enterobacterales bacteremia - IV abx per primary team   Acute biliary pancreatitis, suspect resolving ascending cholangitis, gangrenous cholecystitis  s/p lap chole with Dr. Dwain Sarna on 5/1 - POD#4 - operative findings of liver cysts and cirrhosis - will need PCP/GI follow up   Duodenal perforation s/p exp lap with primary repair of duodenal perforation with [name] patch - Dr. Freida Busman 5/4 POD#1 - keep NGT to suction. NGT to remain until 5/9 - will check upper GI study 5/9 - encouraged IS use - work with PT/OT - continue JP and monitor  Interpretor services were used for the duration of  interview and PE  FEN: NPO, LR+KCl 100 mL/hr ID: ceftriaxone 4/27>5/3, flagyl 4/26>5/4, zosyn 5/4>> Foley: okay to remove today from surgical perspective VTE: SCDs    LOS: 8 days    Eric Form, [facility] 03/02/2021, 10:12 AM Please see Amion for pager number during day hours 7:00am-4:30pm

## 2021-03-02 NOTE — Progress Notes (Addendum)
Pt pulled NG tube out. Disoriented at situations and time. Oriented to self and place.  Dr. Freida Busman was paged.Order received to replace NG tube back in with carefully measurement. We had tried 3-4 attempts. Pt resisted NG insertion. RRT was called for support. Pt's hemodynamically stable.   Filiberto Pinks, RN

## 2021-03-02 NOTE — Progress Notes (Signed)
Pt transferred from PACU, alert and oriented x 3, followed commands. We communicated through Engineer, structural. Room air SPO2 84-85%. On O2 simple face mask at 5 lpm has given to maintain SPO2 96-97%. No SOB. Hemodynamics stable.   Abdominal dressing had minimal scant marked of drainage. JP drain with small serosanguinous drainage. No active bleeding. Morphine 2 mg given for severe pain at arrival. NG tube connected with intermittent low suction, no drainage appeared. NPO.  We will continue to monitor.  Filiberto Pinks, RN

## 2021-03-02 NOTE — Progress Notes (Signed)
PROGRESS NOTE  Elmo Rio WSF:[id number] DOB: 1930-12-14 DOA: 02/22/2021 PCP: Pcp, No   LOS: 8 days   Brief narrative:  [name] a 87 y.o.malewith medical history significant forhypertension, GERDwas admitted to hospital as transfer from [facility] for management of acute pancreatitis in the setting of potential choledocholithiasis.  Patient was also found to have acute biliary pancreatitis.  GI was initially consulted but MRCP did not show choledocholithiasis.  Patient underwent laparoscopic cholecystectomy on 02/26/2021.  Cardiology was consulted for preop risk stratification.  Operatively patient continued to have abdominal pain and distention with renal failure.  He was thought to have biliary leak so underwent relaparotomy.  Assessment/Plan:  Principal Problem:   Acute pancreatitis Active Problems:   Severe sepsis (HCC)   Cholangitis   Abdominal pain   Nausea   Hypertension   GERD (gastroesophageal reflux disease)   Elevated troponin  Acute biliary pancreatitis:  Patient underwent laparoscopic cholecystectomy by general surgery on 02/26/2021.  Postoperatively patient continued to have increasing pain and distention and renal failure.  CT scan of the abdomen was performed which showed signs of biliary leak.  Patient was then taken back to the OR on 03/01/2021 and was noted to have a duodenal perforation.  He underwent laparotomy with primary repair of duodenal perforation with Phillip Heal patch by Dr. Zenia Resides on 5 4.  Continue NG tube to suction.  Nasogastric tube to remain on till 5 9.  Plan for upper GI study on 5 9.  IV Zosyn.  Duodenal perforation, biliary leak.  Status post exploratory laparotomy with repair of duodenal perforation on 03/01/2021.  Management as per surgery.  Continue IV Zosyn due to biliary leak.  Continue IV fluids for now.  Mild leukocytosis we will continue to monitor closely.  Afebrile.  Liver cyst and cirrhosis during surgical  intervention.  Will need outpatient PCP and GI follow-up.    Severe sepsis/enterobacter and E. coli bacteremia secondary to ascending cholangitis, gangrenous cholecystitis Continue IV Rocephin for total of 10 days.  Patient initially met sepsis criteria on admission.    Sore throat from NG tube.  On Cepacol and Chloraseptic.  Elevated troponin with LV systolic dysfunction.: EKG did not show any ischemic changes.  Possibly secondary to demand ischemia .  2D echocardiogram with left ventricular ejection fraction of 40 to 45% with diffuse hypokinesis and abnormal septal motion wall.  Cardiology followed the patient during hospitalization.  Aspirin on hold due to recent postop status.  Will need outpatient follow-up with cardiology.  Continue low-dose Coreg.  Off IV fluids.  Will need ARB at some point.  Mild Dilation of acending aorta Follow-up as outpatient.  PSVT- 2D echo with EF of 45 to 50%.  On Coreg.  Will need repeat TSH follow-up as outpatient.  Acute kidney injury Improved today.  Creatinine was 2.4 yesterday.  Trended down to 1.5.  Continue to monitor closely.  Avoid toxic medications.  Strict intake and output charting.   Essential hypertension: Not on antihypertensives at home.  On Coreg.   GERD: Continue IV PPI   Debility, weakness.  Physical therapy has been consulted pending evaluation.  DVT prophylaxis: Place and maintain sequential compression device Start: 02/23/21 1520 SCDs Start: 02/22/21 1225   Code Status: Full code  Family Communication:  I tried to reach Ms. Reita May the patient's daughter on the phone but was unable to reach her.  Status is: Inpatient  Remains inpatient appropriate because:Unsafe d/c plan, IV treatments appropriate due to intensity of illness or  inability to take PO and Inpatient level of care appropriate due to severity of illness   Dispo: The patient is from: Home              Anticipated d/c is to: Home              Patient  currently is not medically stable to d/c.   Difficult to place patient No  Consultants:  GI  Cardiology  General surgery  Procedures:  Laparoscopic cholecystectomy 02/26/2021  Exploratory laparotomy with primary repair of duodenal perforation with Phillip Heal patch on 03/01/2021  Anti-infectives:  Marland Kitchen Ceftriaxone and metronidazole IV 4/27>5/4 . Zosyn 03/01/2021>  Anti-infectives (From admission, onward)   Start     Dose/Rate Route Frequency Ordered Stop   03/01/21 1800  piperacillin-tazobactam (ZOSYN) IVPB 3.375 g        3.375 g 12.5 mL/hr over 240 Minutes Intravenous Every 8 hours 03/01/21 1732     02/26/21 1500  metroNIDAZOLE (FLAGYL) IVPB 500 mg  Status:  Discontinued        500 mg 100 mL/hr over 60 Minutes Intravenous Every 8 hours 02/26/21 1414 03/01/21 1732   02/22/21 2000  cefTRIAXone (ROCEPHIN) 2 g in sodium chloride 0.9 % 100 mL IVPB  Status:  Discontinued        2 g 200 mL/hr over 30 Minutes Intravenous Daily at 10 pm 02/22/21 1339 03/01/21 1716   02/22/21 1600  metroNIDAZOLE (FLAGYL) IVPB 500 mg  Status:  Discontinued        500 mg 100 mL/hr over 60 Minutes Intravenous Every 8 hours 02/22/21 1339 02/26/21 1414     Subjective: Today, patient was seen and examined at bedside with the help of Spanish interpreter.  Denies any abdominal pain, nausea, vomiting.  He however complains of sore throat from NG tube placement.    Objective: Vitals:   03/02/21 0600 03/02/21 0834  BP: 137/62 (!) 145/69  Pulse: 77 79  Resp: 16 18  Temp:  98.3 F (36.8 C)  SpO2: 96% 96%    Intake/Output Summary (Last 24 hours) at 03/02/2021 1120 Last data filed at 03/02/2021 1093 Gross per 24 hour  Intake 2879.59 ml  Output 835 ml  Net 2044.59 ml   Filed Weights   02/27/21 0604 03/01/21 0457 03/02/21 0514  Weight: 69.5 kg 70.8 kg 70.8 kg   Body mass index is 24.45 kg/m.   Physical Exam: GENERAL: Patient is alert awake and communicative.  NG tube in place.  On simple mask oxygen. HENT: No  scleral pallor or icterus. Pupils equally reactive to light. Oral mucosa is moist NECK: is supple, no gross swelling noted. CHEST: Clear to auscultation. No crackles or wheezes.  Diminished breath sounds bilaterally. CVS: S1 and S2 heard, no murmur. Regular rate and rhythm.  ABDOMEN: Soft, mildly distended abdomen with appropriate tenderness, bowel sounds are present.  Mild guarding over the right lower quadrant.  JP drain in place with cloudy bilious output. Staples visible.  JP drain with scanty bloody drainage. EXTREMITIES: Mild edema noted CNS: Cranial nerves are intact. No focal motor deficits. SKIN: warm and dry, status post exploratory laparotomy.  Data Review: I have personally reviewed the following laboratory data and studies,  CBC: Recent Labs  Lab 02/24/21 0455 02/27/21 0048 02/28/21 0215 03/01/21 0242 03/02/21 0205  WBC 9.5 8.4 11.9* 13.5* 14.4*  HGB 12.1* 12.8* 12.5* 10.9* 10.9*  HCT 34.5* 38.1* 37.4* 32.9* 32.8*  MCV 86.5 86.4 87.6 89.2 89.1  PLT 80* 149* 177 181  177   Basic Metabolic Panel: Recent Labs  Lab 02/24/21 0455 02/25/21 0052 02/27/21 0048 02/28/21 0215 03/01/21 0242 03/02/21 0205  NA 136  --  140 138 136 137  K 3.2* 3.6 3.9 3.6 3.5 4.0  CL 106  --  109 108 108 110  CO2 23  --  22 21* 23 20*  GLUCOSE 104*  --  141* 142* 117* 127*  BUN 18  --  20 36* 50* 44*  CREATININE 1.07  --  1.11 1.45* 2.40* 1.50*  CALCIUM 8.1*  --  7.7* 7.3* 7.1* 7.4*   Liver Function Tests: Recent Labs  Lab 02/24/21 0455 02/27/21 0048 03/01/21 0242  AST 30 54* 59*  ALT 56* 39 29  ALKPHOS 137* 150* 203*  BILITOT 1.2 1.0 0.8  PROT 5.5* 5.5* 4.5*  ALBUMIN 2.1* 2.0* 1.7*   Recent Labs  Lab 02/25/21 0052  LIPASE 92*   No results for input(s): AMMONIA in the last 168 hours. Cardiac Enzymes: No results for input(s): CKTOTAL, CKMB, CKMBINDEX, TROPONINI in the last 168 hours. BNP (last 3 results) No results for input(s): BNP in the last 8760 hours.  ProBNP (last  3 results) No results for input(s): PROBNP in the last 8760 hours.  CBG: No results for input(s): GLUCAP in the last 168 hours. Recent Results (from the past 240 hour(s))  Blood culture (routine x 2)     Status: Abnormal   Collection Time: 02/21/21  8:21 PM   Specimen: BLOOD  Result Value Ref Range Status   Specimen Description   Final    BLOOD LEFT ANTECUBITAL Performed at [facility], [address] [place], [place] [address]    Special Requests   Final    BOTTLES DRAWN AEROBIC AND ANAEROBIC Blood Culture adequate volume Performed at [facility], [address] [place], [place] [address]    Culture  Setup Time   Final    GRAM NEGATIVE RODS IN BOTH AEROBIC AND ANAEROBIC BOTTLES CRITICAL RESULT CALLED TO, READ BACK BY AND VERIFIED WITH: AMY THOMPSON AT 0840 02/22/21 SDR Performed at [facility], [place] [address] [place], [place] [address]    Culture ESCHERICHIA COLI (A)  Final   Report Status 02/24/2021 FINAL  Final   Organism ID, Bacteria ESCHERICHIA COLI  Final      Susceptibility   Escherichia coli - MIC*    AMPICILLIN 8 SENSITIVE Sensitive     CEFAZOLIN <=4 SENSITIVE Sensitive     CEFEPIME <=0.12 SENSITIVE Sensitive     CEFTAZIDIME <=1 SENSITIVE Sensitive     CEFTRIAXONE <=0.25 SENSITIVE Sensitive     CIPROFLOXACIN <=0.25 SENSITIVE Sensitive     GENTAMICIN <=1 SENSITIVE Sensitive     IMIPENEM <=0.25 SENSITIVE Sensitive     TRIMETH/SULFA <=20 SENSITIVE Sensitive     AMPICILLIN/SULBACTAM 4 SENSITIVE Sensitive     PIP/TAZO <=4 SENSITIVE Sensitive     * ESCHERICHIA COLI  Blood Culture ID Panel (Reflexed)     Status: Abnormal   Collection Time: 02/21/21  8:21 PM  Result Value Ref Range Status   Enterococcus faecalis NOT DETECTED NOT DETECTED Final   Enterococcus Faecium NOT DETECTED NOT DETECTED Final   Listeria monocytogenes NOT DETECTED NOT DETECTED Final   Staphylococcus species NOT DETECTED NOT DETECTED Final   Staphylococcus aureus  (BCID) NOT DETECTED NOT DETECTED Final   Staphylococcus epidermidis NOT DETECTED NOT DETECTED Final   Staphylococcus lugdunensis NOT DETECTED NOT DETECTED Final   Streptococcus species NOT DETECTED NOT  DETECTED Final   Streptococcus agalactiae NOT DETECTED NOT DETECTED Final   Streptococcus pneumoniae NOT DETECTED NOT DETECTED Final   Streptococcus pyogenes NOT DETECTED NOT DETECTED Final   A.calcoaceticus-baumannii NOT DETECTED NOT DETECTED Final   Bacteroides fragilis NOT DETECTED NOT DETECTED Final   Enterobacterales DETECTED (A) NOT DETECTED Final    Comment: Enterobacterales represent a large order of gram negative bacteria, not a single organism. CRITICAL RESULT CALLED TO, READ BACK BY AND VERIFIED WITH:  AMY THOMPSON AT 0840 02/22/21 SDR    Enterobacter cloacae complex NOT DETECTED NOT DETECTED Final   Escherichia coli DETECTED (A) NOT DETECTED Final    Comment: CRITICAL RESULT CALLED TO, READ BACK BY AND VERIFIED WITH:  AMY THOMPSON AT 0840 02/22/21 SDR    Klebsiella aerogenes NOT DETECTED NOT DETECTED Final   Klebsiella oxytoca NOT DETECTED NOT DETECTED Final   Klebsiella pneumoniae NOT DETECTED NOT DETECTED Final   Proteus species NOT DETECTED NOT DETECTED Final   Salmonella species NOT DETECTED NOT DETECTED Final   Serratia marcescens NOT DETECTED NOT DETECTED Final   Haemophilus influenzae NOT DETECTED NOT DETECTED Final   Neisseria meningitidis NOT DETECTED NOT DETECTED Final   Pseudomonas aeruginosa NOT DETECTED NOT DETECTED Final   Stenotrophomonas maltophilia NOT DETECTED NOT DETECTED Final   Candida albicans NOT DETECTED NOT DETECTED Final   Candida auris NOT DETECTED NOT DETECTED Final   Candida glabrata NOT DETECTED NOT DETECTED Final   Candida krusei NOT DETECTED NOT DETECTED Final   Candida parapsilosis NOT DETECTED NOT DETECTED Final   Candida tropicalis NOT DETECTED NOT DETECTED Final   Cryptococcus neoformans/gattii NOT DETECTED NOT DETECTED Final   CTX-M  ESBL NOT DETECTED NOT DETECTED Final   Carbapenem resistance IMP NOT DETECTED NOT DETECTED Final   Carbapenem resistance KPC NOT DETECTED NOT DETECTED Final   Carbapenem resistance NDM NOT DETECTED NOT DETECTED Final   Carbapenem resist OXA 48 LIKE NOT DETECTED NOT DETECTED Final   Carbapenem resistance VIM NOT DETECTED NOT DETECTED Final    Comment: Performed at [facility], [address] [place], [place] [address]  Urine culture     Status: Abnormal   Collection Time: 02/21/21  8:30 PM   Specimen: Urine, Random  Result Value Ref Range Status   Specimen Description   Final    URINE, RANDOM Performed at [facility], [address], [place], [place] [address]    Special Requests   Final    NONE Performed at [facility], [address], [place], [place] [address]    Culture (A)  Final    <10,000 COLONIES/mL INSIGNIFICANT GROWTH Performed at [facility], [address], [place], [place] [address]    Report Status 02/23/2021 FINAL  Final  Blood culture (routine x 2)     Status: Abnormal   Collection Time: 02/21/21  8:39 PM   Specimen: BLOOD  Result Value Ref Range Status   Specimen Description   Final    BLOOD BLOOD RIGHT WRIST Performed at [facility], [address] [place], [place] [address]    Special Requests   Final    BOTTLES DRAWN AEROBIC AND ANAEROBIC Blood Culture adequate volume Performed at [facility], [address], [place],  [address]    Culture  Setup Time   Final    GRAM NEGATIVE RODS IN BOTH AEROBIC AND ANAEROBIC BOTTLES CRITICAL VALUE NOTED.  VALUE IS CONSISTENT WITH PREVIOUSLY REPORTED AND CALLED VALUE. Performed at [facility], [place], [place] [address]    Culture (A)  Final    ESCHERICHIA COLI SUSCEPTIBILITIES PERFORMED ON PREVIOUS CULTURE WITHIN THE LAST 5 DAYS. Performed at [facility], [place] [address], [place], [place] [address]    Report  Status 02/24/2021 FINAL  Final  Resp Panel by RT-PCR (Flu A&B, Covid) Nasopharyngeal Swab     Status: None   Collection Time: 02/21/21  8:39 PM   Specimen: Nasopharyngeal Swab; Nasopharyngeal(NP) swabs in vial transport medium  Result Value Ref Range Status   SARS Coronavirus 2 by RT PCR NEGATIVE NEGATIVE Final    Comment: (NOTE) SARS-CoV-2 target nucleic acids are NOT DETECTED.  The SARS-CoV-2 RNA is generally detectable in upper respiratory specimens during the acute phase of infection. The lowest concentration of SARS-CoV-2 viral copies this assay can detect is 138 copies/mL. A negative result does not preclude SARS-Cov-2 infection and should not be used as the sole basis for treatment or other patient management decisions. A negative result may occur with  improper specimen collection/handling, submission of specimen other than nasopharyngeal swab, presence of viral mutation(s) within the areas targeted by this assay, and inadequate number of viral copies(<138 copies/mL). A negative result must be combined with clinical observations, patient history, and epidemiological information. The expected result is Negative.  Fact Sheet for Patients:  EntrepreneurPulse.com.au  Fact Sheet for Healthcare Providers:  IncredibleEmployment.be  This test is no t yet approved or cleared by the [place] FDA and  has been authorized for detection and/or diagnosis of SARS-CoV-2 by FDA under an Emergency Use Authorization (EUA). This EUA will remain  in effect (meaning this test can be used) for the duration of the COVID-19 declaration under Section 564(b)(1) of the Act, 21 U.S.C.section 360bbb-3(b)(1), unless the authorization is terminated  or revoked sooner.       Influenza A by PCR NEGATIVE NEGATIVE Final   Influenza B by PCR NEGATIVE NEGATIVE Final    Comment: (NOTE) The Xpert Xpress SARS-CoV-2/FLU/RSV plus assay is intended as an aid in the  diagnosis of influenza from Nasopharyngeal swab specimens and should not be used as a sole basis for treatment. Nasal washings and aspirates are unacceptable for Xpert Xpress SARS-CoV-2/FLU/RSV testing.  Fact Sheet for Patients: EntrepreneurPulse.com.au  Fact Sheet for Healthcare Providers: IncredibleEmployment.be  This test is not yet approved or cleared by the [place] FDA and has been authorized for detection and/or diagnosis of SARS-CoV-2 by FDA under an Emergency Use Authorization (EUA). This EUA will remain in effect (meaning this test can be used) for the duration of the COVID-19 declaration under Section 564(b)(1) of the Act, 21 U.S.C. section 360bbb-3(b)(1), unless the authorization is terminated or revoked.  Performed at [facility], [address] [place], [place] [address]   Surgical pcr screen     Status: Abnormal   Collection Time: 02/23/21  8:25 PM   Specimen: Nasal Mucosa; Nasal Swab  Result Value Ref Range Status   MRSA, PCR NEGATIVE NEGATIVE Final   Staphylococcus aureus POSITIVE (A) NEGATIVE Final    Comment: (NOTE) The Xpert SA Assay (FDA approved for NASAL specimens in patients 44 years of age and older), is one component of a comprehensive surveillance program. It is not intended to diagnose infection nor to guide or monitor treatment. Performed at [facility], [place] [address] [place], [place] [address]      Studies: CT ABDOMEN PELVIS WO CONTRAST  Result Date: 03/01/2021 CLINICAL DATA:  Abdominal pain,  fever, recent cholecystectomy EXAM: CT ABDOMEN AND PELVIS WITHOUT CONTRAST TECHNIQUE: Multidetector CT imaging of the abdomen and pelvis was performed following the standard protocol without IV contrast. COMPARISON:  02/22/2021, 02/21/2021 FINDINGS: Lower chest: There are small bilateral pleural effusions with compressive lower lobe atelectasis. Unenhanced CT was performed per clinician order. Lack  of IV contrast limits sensitivity and specificity, especially for evaluation of abdominal/pelvic solid viscera. Hepatobiliary: Postsurgical changes are seen from cholecystectomy. There is a 3.7 x 3.2 cm fluid collection in the gallbladder fossa adjacent to the cholecystectomy clips. Given recent surgical intervention, this could reflect postoperative hematoma or seroma. Evaluation is limited without IV contrast. There is a surgical drain traversing the cholecystectomy site. Extravasated oral contrast is seen along the course of the surgical drain, compatible with perforated duodenum. Please refer to discussion below. Numerous hepatic cysts are identified. No intrahepatic duct dilation. Dilation of the common bile duct measuring 16 mm compatible with cholecystectomy. No evidence of choledocholithiasis. Pancreas: Unremarkable. No pancreatic ductal dilatation or surrounding inflammatory changes. Spleen: Normal in size.  Stable splenic cyst. Adrenals/Urinary Tract: Mild bilateral renal cortical atrophy. No urinary tract calculi or obstructive uropathy. The adrenals and bladder are unremarkable. Stomach/Bowel: As noted above, there is extravasation of oral contrast along the right lateral margin of the proximal duodenum, reference image 41/3, consistent with duodenal perforation. Oral contrast is seen extending into the right upper quadrant along the course of the indwelling surgical drain. There is no evidence of bowel obstruction. Scattered gas fluid levels are seen throughout the colon, nonspecific. 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 Diffuse atherosclerosis. 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 Reproductive: Prostate is unremarkable. Other: There is a loculated area of fluid within the right lower quadrant measuring 7.4 by 5.6 by 8.3 cm. Fluid is low-attenuation, without any evidence of  secondary infection on this limited unenhanced exam. There is a small amount of pneumoperitoneum throughout the upper abdomen, likely a combination of postoperative change, indwelling surgical drain, and the perforated duodenum as described above. No abdominal wall hernia. Body wall edema is identified greatest in the bilateral flanks. Musculoskeletal: There are no acute or destructive bony lesions. Reconstructed images demonstrate no additional findings. IMPRESSION: 1. Perforation of the proximal duodenum, with extravasated oral contrast in the right upper quadrant. 2. Minimal pneumoperitoneum within the upper abdomen, consistent with recent surgical intervention, internal drain, and perforated duodenum as described above. Favor postoperative seroma or hematoma. 3. Postsurgical changes from cholecystectomy, with indwelling surgical drain. Small fluid collection in the gallbladder fossa is nonspecific on this unenhanced exam. 4. Loculated simple appearing fluid in the right lower quadrant, nonspecific. No internal gas or septations identified on this limited unenhanced exam. 5. No evidence of bowel obstruction or ileus. 6. 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 Recommend follow-up ultrasound every 3 years. This recommendation follows ACR consensus guidelines: White Paper of the ACR Incidental Findings Committee II on Vascular Findings. J Am Coll Radiol 2013; 10:789-794. 7. Bilateral pleural effusions with compressive atelectasis of the bilateral lower lobes. Critical Value/emergent results were called by telephone at the time of interpretation on 03/01/2021 at 4:39 pm to provider [facility], who verbally acknowledged these results. Electronically Signed   By: Randa Ngo M.D.   On: 03/01/2021 16:45   DG Chest Portable 1 View  Result Date: 03/02/2021 CLINICAL DATA:  Nasogastric tube insertion EXAM:  PORTABLE CHEST 1 VIEW COMPARISON:  02/21/2021  FINDINGS: Enteric tube with tip and side-port over the stomach. Postoperative right upper quadrant with drain. Worsening chest with hazy density on the right more than left. No superimposed Kerley lines or pneumothorax. Normal heart size. IMPRESSION: 1. New enteric tube in good position. 2. Low volume chest with atelectasis and pleural effusions. Electronically Signed   By: Monte Fantasia M.D.   On: 03/02/2021 04:06   DG Abd Portable 1V  Result Date: 02/28/2021 CLINICAL DATA:  Ileus.  Pancreatitis.  Recent cholecystectomy EXAM: PORTABLE ABDOMEN - 1 VIEW COMPARISON:  CT abdomen and pelvis February 21, 2021; MR abdomen February 22, 2021 FINDINGS: Loops of mildly dilated small bowel noted without air-fluid levels. No free air evident on supine examination. Surgical clips noted in gallbladder fossa region. Surgical drain noted in the lateral right abdomen. IMPRESSION: Loops of mildly dilated small bowel without air-fluid levels likely represent postoperative ileus. Drain in lateral right mid abdomen. Surgical clips gallbladder fossa. No free air evident on supine examination. Electronically Signed   By: Lowella Grip III M.D.   On: 02/28/2021 15:59     Flora Lipps, MD  [facility] 03/02/2021  If 7PM-7AM, please contact night-coverage

## 2021-03-03 LAB — COMPREHENSIVE METABOLIC PANEL
ALT: 16 U/L (ref 0–44)
AST: 19 U/L (ref 15–41)
Albumin: 1.9 g/dL — ABNORMAL LOW (ref 3.5–5.0)
Alkaline Phosphatase: 124 U/L (ref 38–126)
Anion gap: 8 (ref 5–15)
BUN: 34 mg/dL — ABNORMAL HIGH (ref 8–23)
CO2: 22 mmol/L (ref 22–32)
Calcium: 8 mg/dL — ABNORMAL LOW (ref 8.9–10.3)
Chloride: 110 mmol/L (ref 98–111)
Creatinine, Ser: 1.16 mg/dL (ref 0.61–1.24)
GFR, Estimated: >60 mL/min (ref >60)
Glucose, Bld: 104 mg/dL — ABNORMAL HIGH (ref 70–99)
Potassium: 4 mmol/L (ref 3.5–5.1)
Sodium: 140 mmol/L (ref 135–145)
Total Bilirubin: 0.8 mg/dL (ref 0.3–1.2)
Total Protein: 4.9 g/dL — ABNORMAL LOW (ref 6.5–8.1)

## 2021-03-03 LAB — CBC
HCT: 32.3 % — ABNORMAL LOW (ref 39.0–52.0)
Hemoglobin: 10.8 g/dL — ABNORMAL LOW (ref 13.0–17.0)
MCH: 29.6 pg (ref 26.0–34.0)
MCHC: 33.4 g/dL (ref 30.0–36.0)
MCV: 88.5 fL (ref 80.0–100.0)
Platelets: 266 10*3/uL (ref 150–400)
RBC: 3.65 MIL/uL — ABNORMAL LOW (ref 4.22–5.81)
RDW: 13.7 % (ref 11.5–15.5)
WBC: 20.5 10*3/uL — ABNORMAL HIGH (ref 4.0–10.5)
nRBC: 0 % (ref 0.0–0.2)

## 2021-03-03 LAB — DIFFERENTIAL
Abs Immature Granulocytes: 0.19 10*3/uL — ABNORMAL HIGH (ref 0.00–0.07)
Basophils Absolute: 0 10*3/uL (ref 0.0–0.1)
Basophils Relative: 0 %
Eosinophils Absolute: 0 10*3/uL (ref 0.0–0.5)
Eosinophils Relative: 0 %
Immature Granulocytes: 1 %
Lymphocytes Relative: 6 %
Lymphs Abs: 1.2 10*3/uL (ref 0.7–4.0)
Monocytes Absolute: 1 10*3/uL (ref 0.1–1.0)
Monocytes Relative: 5 %
Neutro Abs: 18 10*3/uL — ABNORMAL HIGH (ref 1.7–7.7)
Neutrophils Relative %: 88 %

## 2021-03-03 LAB — MAGNESIUM: Magnesium: 2 mg/dL (ref 1.7–2.4)

## 2021-03-03 LAB — GLUCOSE, CAPILLARY: Glucose-Capillary: 160 mg/dL — ABNORMAL HIGH (ref 70–99)

## 2021-03-03 LAB — PREALBUMIN: Prealbumin: 5 mg/dL — ABNORMAL LOW (ref 18–38)

## 2021-03-03 LAB — PHOSPHORUS: Phosphorus: 2 mg/dL — ABNORMAL LOW (ref 2.5–4.6)

## 2021-03-03 LAB — TRIGLYCERIDES: Triglycerides: 84 mg/dL (ref <150)

## 2021-03-03 MED ORDER — POTASSIUM PHOSPHATES 15 MMOLE/5ML IV SOLN
30.0000 mmol | Freq: Once | INTRAVENOUS | Status: AC
  Administered 2021-03-03: 30 mmol via INTRAVENOUS
  Filled 2021-03-03: qty 10

## 2021-03-03 MED ORDER — FUROSEMIDE 10 MG/ML IJ SOLN
40.0000 mg | Freq: Once | INTRAMUSCULAR | Status: AC
  Administered 2021-03-03: 40 mg via INTRAVENOUS
  Filled 2021-03-03: qty 4

## 2021-03-03 MED ORDER — INSULIN ASPART 100 UNIT/ML IJ SOLN
0.0000 [IU] | Freq: Four times a day (QID) | INTRAMUSCULAR | Status: DC
  Administered 2021-03-03: 2 [IU] via SUBCUTANEOUS
  Administered 2021-03-04 (×2): 1 [IU] via SUBCUTANEOUS

## 2021-03-03 MED ORDER — MORPHINE SULFATE (PF) 2 MG/ML IV SOLN
2.0000 mg | INTRAVENOUS | Status: DC | PRN
  Administered 2021-03-03 – 2021-03-13 (×20): 2 mg via INTRAVENOUS
  Filled 2021-03-03 (×20): qty 1

## 2021-03-03 MED ORDER — ENOXAPARIN SODIUM 40 MG/0.4ML IJ SOSY
40.0000 mg | PREFILLED_SYRINGE | INTRAMUSCULAR | Status: DC
  Administered 2021-03-03 – 2021-03-14 (×12): 40 mg via SUBCUTANEOUS
  Filled 2021-03-03 (×12): qty 0.4

## 2021-03-03 MED ORDER — TRAVASOL 10 % IV SOLN
INTRAVENOUS | Status: AC
  Filled 2021-03-03: qty 411.6

## 2021-03-03 MED ORDER — ACETAMINOPHEN 10 MG/ML IV SOLN
1000.0000 mg | Freq: Four times a day (QID) | INTRAVENOUS | Status: AC
  Administered 2021-03-03 – 2021-03-04 (×4): 1000 mg via INTRAVENOUS
  Filled 2021-03-03 (×4): qty 100

## 2021-03-03 MED ORDER — HYDRALAZINE HCL 20 MG/ML IJ SOLN
10.0000 mg | Freq: Four times a day (QID) | INTRAMUSCULAR | Status: DC | PRN
  Administered 2021-03-03 – 2021-03-07 (×5): 10 mg via INTRAVENOUS
  Filled 2021-03-03 (×5): qty 1

## 2021-03-03 MED ORDER — METHOCARBAMOL 1000 MG/10ML IJ SOLN
500.0000 mg | Freq: Three times a day (TID) | INTRAVENOUS | Status: DC | PRN
  Filled 2021-03-03 (×2): qty 5

## 2021-03-03 MED ORDER — LACTATED RINGERS IV SOLN: INTRAVENOUS | Status: AC

## 2021-03-03 MED ORDER — LIDOCAINE 5 % EX PTCH
1.0000 | MEDICATED_PATCH | CUTANEOUS | Status: DC
  Administered 2021-03-03 – 2021-03-14 (×12): 1 via TRANSDERMAL
  Filled 2021-03-03 (×12): qty 1

## 2021-03-03 MED ORDER — PANTOPRAZOLE SODIUM 40 MG IV SOLR
40.0000 mg | Freq: Two times a day (BID) | INTRAVENOUS | Status: DC
  Administered 2021-03-03 – 2021-03-11 (×18): 40 mg via INTRAVENOUS
  Filled 2021-03-03 (×18): qty 40

## 2021-03-03 NOTE — Progress Notes (Signed)
Physical Therapy Treatment Patient Details Name: Travis Gallagher MRN: [id number] DOB: [birth year] Today's Date: 03/03/2021    History of Present Illness 87 y.o. M admitted 02/22/2021 with gallstone pancreatitis, underwent laparoscopic cholecystectomy with gangrenous cholecystitis. Also found to have LV dysfunction with mild-moderate mitral regurgitation, PSVT, elevated troponin. On 5/4 pt found to have duodenal perforation on CT, underwent ex-lap with repair of duodenal perf with [name] patch. Significant PMH: HTN, GERD, Visiting from [place].    PT Comments    Pt received in supine, drowsy but agreeable to therapy session with encouragement, male family member and translator Graciela present. Pt with fair tolerance for bed mobility (log roll instruction) and transfer training this date, limited due to symptomatic orthostatic hypotension and with limited standing tolerance so unable to perform gait/stair training in room and pt requiring slightly increased assist due to fatigue/dizziness with all mobility tasks. Pt remains on NGT to suction and reviewed pressure offloading/importance of keeping HOB >30 degrees due to NGT. Pt with slow processing and needs multimodal cues and increased time to perform all tasks. Pt continues to benefit from PT services to progress toward functional mobility goals. He may benefit from TED hose to see if this helps with hemodynamics next session, MD/RN notified. Continue to recommend HHPT. Orthostatic BP Supine (he also had BP meds just after this was taken) 169/77 (100)  Sitting 135/63 (83)  Standing Attempted but unable to stand long enough for assessment; dizzy  Supine after standing trials x2 126/57 (77)     Follow Up Recommendations  Home health PT;Supervision for mobility/OOB     Equipment Recommendations  Rolling walker with 5" wheels;3in1 (PT)    Recommendations for Other Services       Precautions / Restrictions Precautions Precautions:  Fall Precaution Comments: JP drain, NG tube Restrictions Weight Bearing Restrictions: No    Mobility  Bed Mobility Overal bed mobility: Needs Assistance Bed Mobility: Rolling;Sidelying to Sit;Sit to Sidelying Rolling: Mod assist;+2 for safety/equipment Sidelying to sit: Mod assist;+2 for safety/equipment;+2 for physical assistance;HOB elevated     Sit to sidelying: Min assist;+2 for physical assistance;+2 for safety/equipment;HOB elevated General bed mobility comments: single step cues for sequencing (rolling first prior to giving instruction on sitting up) to prevent pt from activating core muscles too much as he did that previous session.    Transfers Overall transfer level: Needs assistance Equipment used: Rolling walker (2 wheeled) Transfers: Sit to/from Stand Sit to Stand: Min assist;+2 safety/equipment         General transfer comment: from EOB<>RW x2 reps, dizziness upon standing and posterior lean  Ambulation/Gait Ambulation/Gait assistance: Min assist;+2 physical assistance;+2 safety/equipment Gait Distance (Feet): 2 Feet (sidesteps x2 reps (38ft total)) Assistive device: Rolling walker (2 wheeled) Gait Pattern/deviations: Step-to pattern;Shuffle;Leaning posteriorly     General Gait Details: pt with shortened step-to gait, mild posterior loss of balance with stepping; sidesteps toward [facility] x2 trials; limited by dizziness so deferred further ambulation   Stairs             Wheelchair Mobility    Modified Rankin (Stroke Patients Only)       Balance Overall balance assessment: Needs assistance Sitting-balance support: Feet supported;Bilateral upper extremity supported Sitting balance-Leahy Scale: Fair Sitting balance - Comments: fatigued/drowsy so needing UE support   Standing balance support: Bilateral upper extremity supported Standing balance-Leahy Scale: Poor Standing balance comment: reliant on external support, posterior LOB needing minA to  correct with RW support  Cognition Arousal/Alertness: Lethargic (drowsy) Behavior During Therapy: WFL for tasks assessed/performed;Flat affect Overall Cognitive Status: Difficult to assess        General Comments: pt very drowsy and tending to keep eyes closed this date, he reports dizziness with transfers but unable to stand long enough for BP assessment; HoH and seems to have slow processing this date, needs multimodal cues.      Exercises      General Comments General comments (skin integrity, edema, etc.): SpO2 96% on 2L O2 face mask supine then 88-90% seated EOB, needs cues for pursed-lip breathing throughout mobility; BP 169/77 (100) supine, BP 135/63 (83) seated; BP 126/57 (77) supine after exertion (unable to stand long enough for standing BP due to dizziness); HR 90's bpm      Pertinent Vitals/Pain Pain Assessment: Faces Faces Pain Scale: Hurts even more Pain Location: abdomen Pain Descriptors / Indicators: Sore;Grimacing;Discomfort Pain Intervention(s): Limited activity within patient's tolerance;Monitored during session;Repositioned           PT Goals (current goals can now be found in the care plan section) Acute Rehab PT Goals Patient Stated Goal: get better PT Goal Formulation: With patient Time For Goal Achievement: 03/16/21 Potential to Achieve Goals: Good Progress towards PT goals: Progressing toward goals (orthostatic sxs limiting)    Frequency    Min 3X/week      PT Plan Current plan remains appropriate    Co-evaluation              AM-PAC PT "6 Clicks" Mobility   Outcome Measure  Help needed turning from your back to your side while in a flat bed without using bedrails?: A Little Help needed moving from lying on your back to sitting on the side of a flat bed without using bedrails?: A Little Help needed moving to and from a bed to a chair (including a wheelchair)?: A Little Help needed standing up from a chair using your  arms (e.g., wheelchair or bedside chair)?: A Little Help needed to walk in hospital room?: A Lot Help needed climbing 3-5 steps with a railing? : Total 6 Click Score: 15    End of Session Equipment Utilized During Treatment: Gait belt;Oxygen Activity Tolerance: Patient limited by fatigue;Patient limited by pain;Other (comment) (orthostatic hypotension) Patient left: in bed;with call bell/phone within reach;with bed alarm set;with SCD's reapplied;with family/visitor present;Other (comment);with nursing/sitter in room (pillow under R hip to offload and heels floated, HOB >30 deg) Nurse Communication: Mobility status;Other (comment) (orthostatic hypotension) PT Visit Diagnosis: Unsteadiness on feet (R26.81);Muscle weakness (generalized) (M62.81);Difficulty in walking, not elsewhere classified (R26.2);Pain Pain - part of body:  (abdomen)     Time: 3474-2595 PT Time Calculation (min) (ACUTE ONLY): 30 min  Charges:  $Therapeutic Exercise: 8-22 mins $Therapeutic Activity: 8-22 mins                     Srishti Strnad P., PTA Acute Rehabilitation Services Pager: [phone] Office: [phone]   [place] 03/03/2021, 5:39 PM

## 2021-03-03 NOTE — Plan of Care (Signed)
  Problem: Education: Goal: Knowledge of General Education information will improve Description: Including pain rating scale, medication(s)/side effects and non-pharmacologic comfort measures Outcome: Progressing   Problem: Activity: Goal: Risk for activity intolerance will decrease Outcome: Progressing   Problem: Coping: Goal: Level of anxiety will decrease Outcome: Progressing   

## 2021-03-03 NOTE — Progress Notes (Signed)
PROGRESS NOTE  Travis Gallagher MWU:[id number] DOB: 1931-09-22 DOA: 02/22/2021 PCP: Pcp, No   LOS: 9 days   Brief narrative:  Travis Gerold Rodriguezis a 87 y.o.malewith medical history significant forhypertension, GERDwas admitted to hospital as transfer from [facility] for management of acute pancreatitis in the setting of potential choledocholithiasis.  Patient was also found to have acute biliary pancreatitis.  GI was initially consulted but MRCP did not show choledocholithiasis.  Patient underwent laparoscopic cholecystectomy on 02/26/2021.  Cardiology was consulted for preop risk stratification.  Post -operatively patient continued to have abdominal pain and distention with renal failure.  He was thought to have biliary leak so underwent relaparotomy on 03/01/2021.  Patient was continued on TPN and NG tube.  Assessment/Plan:    Principal Problem:   Acute pancreatitis Active Problems:   Severe sepsis (HCC)   Cholangitis   Abdominal pain   Nausea   Hypertension   GERD (gastroesophageal reflux disease)   Elevated troponin  Acute biliary pancreatitis:  Patient underwent laparoscopic cholecystectomy by general surgery on 02/26/2021.  Postoperatively, patient continued to have increasing pain and distention and renal failure.  CT scan of the abdomen was performed which showed signs of biliary leak.  Patient was then taken back to the OR on 03/01/2021 and was noted to have a duodenal perforation.  He underwent laparotomy with primary repair of duodenal perforation with Phillip Heal patch by Dr. Zenia Resides on 03/01/21.  Continue NG tube to suction.  Nasogastric tube to remain on till 5/ 9.  Plan for upper GI study on 5/ 9.  Continue IV Zosyn due to duodenal perforation.  Continue TPN for nutrition.  Duodenal perforation, biliary leak.  Status post exploratory laparotomy with repair of duodenal perforation on 03/01/2021.  Management as per surgery.  Continue IV Zosyn due to biliary leak.  Continue IV  fluids for now.  Mild leukocytosis we will continue to monitor closely.  Afebrile.  Mild shortness of breath hypoxia.  Patient is on IV fluids.  Will give 1 dose of IV Lasix today.  Reassess in a.m.  Liver cyst and cirrhosis during surgical intervention.  Will need outpatient PCP and GI follow-up.    Severe sepsis/enterobacter and E. coli bacteremia secondary to ascending cholangitis, gangrenous cholecystitis Currently on IV Zosyn secondary to duodenal perforation.  Patient initially met sepsis criteria on admission.    Sore throat from NG tube.  On Cepacol and Chloraseptic.  Continue supportive care.  Elevated troponin with LV systolic dysfunction.: EKG did not show any ischemic changes.  Possibly secondary to demand ischemia . 2D echocardiogram with left ventricular ejection fraction of 40 to 45% with diffuse hypokinesis and abnormal septal motion wall.  Cardiology followed the patient during hospitalization.  Aspirin on hold due to recent postop status.  Will need outpatient follow-up with cardiology.  Was on Coreg, has been changed to metoprolol IV.  Will need ARB at some point.  Mild Dilation of acending aorta Follow-up as outpatient.  PSVT- 2D echo with EF of 45 to 50%.  On IV metoprolol at this time.  Was on Coreg.  Will need repeat TSH follow-up as outpatient.  Acute kidney injury Improved.  Latest creatinine of 1.1   Essential hypertension: Not on antihypertensives at home.  Was on Coreg has been changed to metoprolol IV.  Add IV hydralazine   GERD: Continue IV PPI   Debility, weakness.  Physical therapy has seen the patient and recommend home health PT on discharge.  Hypophosphatemia.  Will replenish with K-Phos.  Check levels in a.m.  DVT prophylaxis: enoxaparin (LOVENOX) injection 40 mg Start: 03/03/21 1000 Place and maintain sequential compression device Start: 02/23/21 1520 SCDs Start: 02/22/21 1225   Code Status: Full code  Family Communication:  I  spoke with the patient's daughter at bedside. Status is: Inpatient  Remains inpatient appropriate because:Unsafe d/c plan, IV treatments appropriate due to intensity of illness or inability to take PO and Inpatient level of care appropriate due to severity of illness, status post exploratory laparotomy.   Dispo: The patient is from: Home              Anticipated d/c is to: Home with home health              Patient currently is not medically stable to d/c.   Difficult to place patient No  Consultants:  GI  Cardiology  General surgery  Procedures:  Laparoscopic cholecystectomy 02/26/2021  Exploratory laparotomy with primary repair of duodenal perforation with Phillip Heal patch on 03/01/2021  Anti-infectives:  Marland Kitchen Ceftriaxone and metronidazole IV 4/27>5/4 . Zosyn 03/01/2021>  Anti-infectives (From admission, onward)   Start     Dose/Rate Route Frequency Ordered Stop   03/01/21 1800  piperacillin-tazobactam (ZOSYN) IVPB 3.375 g        3.375 g 12.5 mL/hr over 240 Minutes Intravenous Every 8 hours 03/01/21 1732     02/26/21 1500  metroNIDAZOLE (FLAGYL) IVPB 500 mg  Status:  Discontinued        500 mg 100 mL/hr over 60 Minutes Intravenous Every 8 hours 02/26/21 1414 03/01/21 1732   02/22/21 2000  cefTRIAXone (ROCEPHIN) 2 g in sodium chloride 0.9 % 100 mL IVPB  Status:  Discontinued        2 g 200 mL/hr over 30 Minutes Intravenous Daily at 10 pm 02/22/21 1339 03/01/21 1716   02/22/21 1600  metroNIDAZOLE (FLAGYL) IVPB 500 mg  Status:  Discontinued        500 mg 100 mL/hr over 60 Minutes Intravenous Every 8 hours 02/22/21 1339 02/26/21 1414     Subjective: Today, patient was seen and examined at bedside with the help of a Spanish interpreter.  Patient's family at bedside.  Patient denies any nausea, vomiting, abdominal pain but has generalized weakness and fatigue.  He complains of sore throat.  Burning sensation during urination.   Objective: Vitals:   03/03/21 0733 03/03/21 1030  BP:  (!) 164/74 (!) 178/80  Pulse: 94 97  Resp: 20 (!) 24  Temp: 98.5 F (36.9 C) 98.6 F (37 C)  SpO2: 97% 98%    Intake/Output Summary (Last 24 hours) at 03/03/2021 1051 Last data filed at 03/03/2021 0737 Gross per 24 hour  Intake 1701.96 ml  Output 1900 ml  Net -198.04 ml   Filed Weights   03/02/21 0514 03/02/21 1534 03/03/21 0347  Weight: 70.8 kg 73.1 kg 71.9 kg   Body mass index is 24.83 kg/m.   Physical Exam: General:  Average built, not in obvious distress, NG tube in place, on simple facemask as scheduled HENT:   No scleral pallor or icterus noted. Oral mucosa is moist.  Chest:  Clear breath sounds.  Diminished breath sounds bilaterally. No crackles or wheezes.  CVS: S1 &S2 heard. No murmur.  Regular rate and rhythm. Abdomen: Soft, appropriately tender on palpation, bowel sounds are present, mildly distended, JP drain in place,  extremities: No cyanosis, clubbing but bilateral lower extremity edema noted.  Peripheral pulses are palpable.  Right upper extremity PICC line in place  Psych: Alert, awake and oriented, normal mood CNS:  No cranial nerve deficits.  Power equal in all extremities.   Skin: Warm and dry.  Status post exploratory laparotomy   Data Review: I have personally reviewed the following laboratory data and studies,  CBC: Recent Labs  Lab 02/27/21 0048 02/28/21 0215 03/01/21 0242 03/02/21 0205 03/03/21 0412  WBC 8.4 11.9* 13.5* 14.4* 20.5*  NEUTROABS  --   --   --   --  18.0*  HGB 12.8* 12.5* 10.9* 10.9* 10.8*  HCT 38.1* 37.4* 32.9* 32.8* 32.3*  MCV 86.4 87.6 89.2 89.1 88.5  PLT 149* 177 181 194 326   Basic Metabolic Panel: Recent Labs  Lab 02/27/21 0048 02/28/21 0215 03/01/21 0242 03/02/21 0205 03/03/21 0412  NA 140 138 136 137 140  K 3.9 3.6 3.5 4.0 4.0  CL 109 108 108 110 110  CO2 22 21* 23 20* 22  GLUCOSE 141* 142* 117* 127* 104*  BUN 20 36* 50* 44* 34*  CREATININE 1.11 1.45* 2.40* 1.50* 1.16  CALCIUM 7.7* 7.3* 7.1* 7.4* 8.0*  MG  --    --   --   --  2.0  PHOS  --   --   --   --  2.0*   Liver Function Tests: Recent Labs  Lab 02/27/21 0048 03/01/21 0242 03/03/21 0412  AST 54* 59* 19  ALT 39 29 16  ALKPHOS 150* 203* 124  BILITOT 1.0 0.8 0.8  PROT 5.5* 4.5* 4.9*  ALBUMIN 2.0* 1.7* 1.9*   Recent Labs  Lab 02/25/21 0052  LIPASE 92*   No results for input(s): AMMONIA in the last 168 hours. Cardiac Enzymes: No results for input(s): CKTOTAL, CKMB, CKMBINDEX, TROPONINI in the last 168 hours. BNP (last 3 results) No results for input(s): BNP in the last 8760 hours.  ProBNP (last 3 results) No results for input(s): PROBNP in the last 8760 hours.  CBG: No results for input(s): GLUCAP in the last 168 hours. Recent Results (from the past 240 hour(s))  Blood culture (routine x 2)     Status: Abnormal   Collection Time: 02/21/21  8:21 PM   Specimen: BLOOD  Result Value Ref Range Status   Specimen Description   Final    BLOOD LEFT ANTECUBITAL Performed at [facility], [address] [place], [place] [address]    Special Requests   Final    BOTTLES DRAWN AEROBIC AND ANAEROBIC Blood Culture adequate volume Performed at [facility], [address] [place], [place] [address]    Culture  Setup Time   Final    GRAM NEGATIVE RODS IN BOTH AEROBIC AND ANAEROBIC BOTTLES CRITICAL RESULT CALLED TO, READ BACK BY AND VERIFIED WITH: AMY THOMPSON AT 0840 02/22/21 SDR Performed at [facility], [place] [address] [place], [place] [address]    Culture ESCHERICHIA COLI (A)  Final   Report Status 02/24/2021 FINAL  Final   Organism ID, Bacteria ESCHERICHIA COLI  Final      Susceptibility   Escherichia coli - MIC*    AMPICILLIN 8 SENSITIVE Sensitive     CEFAZOLIN <=4 SENSITIVE Sensitive     CEFEPIME <=0.12 SENSITIVE Sensitive     CEFTAZIDIME <=1 SENSITIVE Sensitive     CEFTRIAXONE <=0.25 SENSITIVE Sensitive     CIPROFLOXACIN <=0.25 SENSITIVE Sensitive     GENTAMICIN <=1 SENSITIVE Sensitive      IMIPENEM <=0.25 SENSITIVE Sensitive     TRIMETH/SULFA <=20 SENSITIVE Sensitive     AMPICILLIN/SULBACTAM  4 SENSITIVE Sensitive     PIP/TAZO <=4 SENSITIVE Sensitive     * ESCHERICHIA COLI  Blood Culture ID Panel (Reflexed)     Status: Abnormal   Collection Time: 02/21/21  8:21 PM  Result Value Ref Range Status   Enterococcus faecalis NOT DETECTED NOT DETECTED Final   Enterococcus Faecium NOT DETECTED NOT DETECTED Final   Listeria monocytogenes NOT DETECTED NOT DETECTED Final   Staphylococcus species NOT DETECTED NOT DETECTED Final   Staphylococcus aureus (BCID) NOT DETECTED NOT DETECTED Final   Staphylococcus epidermidis NOT DETECTED NOT DETECTED Final   Staphylococcus lugdunensis NOT DETECTED NOT DETECTED Final   Streptococcus species NOT DETECTED NOT DETECTED Final   Streptococcus agalactiae NOT DETECTED NOT DETECTED Final   Streptococcus pneumoniae NOT DETECTED NOT DETECTED Final   Streptococcus pyogenes NOT DETECTED NOT DETECTED Final   A.calcoaceticus-baumannii NOT DETECTED NOT DETECTED Final   Bacteroides fragilis NOT DETECTED NOT DETECTED Final   Enterobacterales DETECTED (A) NOT DETECTED Final    Comment: Enterobacterales represent a large order of gram negative bacteria, not a single organism. CRITICAL RESULT CALLED TO, READ BACK BY AND VERIFIED WITH:  AMY THOMPSON AT 0840 02/22/21 SDR    Enterobacter cloacae complex NOT DETECTED NOT DETECTED Final   Escherichia coli DETECTED (A) NOT DETECTED Final    Comment: CRITICAL RESULT CALLED TO, READ BACK BY AND VERIFIED WITH:  AMY THOMPSON AT 0840 02/22/21 SDR    Klebsiella aerogenes NOT DETECTED NOT DETECTED Final   Klebsiella oxytoca NOT DETECTED NOT DETECTED Final   Klebsiella pneumoniae NOT DETECTED NOT DETECTED Final   Proteus species NOT DETECTED NOT DETECTED Final   Salmonella species NOT DETECTED NOT DETECTED Final   Serratia marcescens NOT DETECTED NOT DETECTED Final   Haemophilus influenzae NOT DETECTED NOT DETECTED Final    Neisseria meningitidis NOT DETECTED NOT DETECTED Final   Pseudomonas aeruginosa NOT DETECTED NOT DETECTED Final   Stenotrophomonas maltophilia NOT DETECTED NOT DETECTED Final   Candida albicans NOT DETECTED NOT DETECTED Final   Candida auris NOT DETECTED NOT DETECTED Final   Candida glabrata NOT DETECTED NOT DETECTED Final   Candida krusei NOT DETECTED NOT DETECTED Final   Candida parapsilosis NOT DETECTED NOT DETECTED Final   Candida tropicalis NOT DETECTED NOT DETECTED Final   Cryptococcus neoformans/gattii NOT DETECTED NOT DETECTED Final   CTX-M ESBL NOT DETECTED NOT DETECTED Final   Carbapenem resistance IMP NOT DETECTED NOT DETECTED Final   Carbapenem resistance KPC NOT DETECTED NOT DETECTED Final   Carbapenem resistance NDM NOT DETECTED NOT DETECTED Final   Carbapenem resist OXA 48 LIKE NOT DETECTED NOT DETECTED Final   Carbapenem resistance VIM NOT DETECTED NOT DETECTED Final    Comment: Performed at [facility], [address] [place], [place] [address]  Urine culture     Status: Abnormal   Collection Time: 02/21/21  8:30 PM   Specimen: Urine, Random  Result Value Ref Range Status   Specimen Description   Final    URINE, RANDOM Performed at [facility], [address], [place], [place] [address]    Special Requests   Final    NONE Performed at [facility], [address], [place], [place] [address]    Culture (A)  Final    <10,000 COLONIES/mL INSIGNIFICANT GROWTH Performed at [facility], [address], [place], [place] [address]    Report Status 02/23/2021 FINAL  Final  Blood culture (routine x 2)     Status: Abnormal  Collection Time: 02/21/21  8:39 PM   Specimen: BLOOD  Result Value Ref Range Status   Specimen Description   Final    BLOOD BLOOD RIGHT WRIST Performed at [facility], [address], [place], [place] [address]    Special Requests   Final    BOTTLES DRAWN AEROBIC AND ANAEROBIC Blood Culture  adequate volume Performed at [facility], [address] [place], [place] [address]    Culture  Setup Time   Final    GRAM NEGATIVE RODS IN BOTH AEROBIC AND ANAEROBIC BOTTLES CRITICAL VALUE NOTED.  VALUE IS CONSISTENT WITH PREVIOUSLY REPORTED AND CALLED VALUE. Performed at [facility], [address] [place], [place] [address]    Culture (A)  Final    ESCHERICHIA COLI SUSCEPTIBILITIES PERFORMED ON PREVIOUS CULTURE WITHIN THE LAST 5 DAYS. Performed at [facility], [place] [address], [place], [place] [address]    Report Status 02/24/2021 FINAL  Final  Resp Panel by RT-PCR (Flu A&B, Covid) Nasopharyngeal Swab     Status: None   Collection Time: 02/21/21  8:39 PM   Specimen: Nasopharyngeal Swab; Nasopharyngeal(NP) swabs in vial transport medium  Result Value Ref Range Status   SARS Coronavirus 2 by RT PCR NEGATIVE NEGATIVE Final    Comment: (NOTE) SARS-CoV-2 target nucleic acids are NOT DETECTED.  The SARS-CoV-2 RNA is generally detectable in upper respiratory specimens during the acute phase of infection. The lowest concentration of SARS-CoV-2 viral copies this assay can detect is 138 copies/mL. A negative result does not preclude SARS-Cov-2 infection and should not be used as the sole basis for treatment or other patient management decisions. A negative result may occur with  improper specimen collection/handling, submission of specimen other than nasopharyngeal swab, presence of viral mutation(s) within the areas targeted by this assay, and inadequate number of viral copies(<138 copies/mL). A negative result must be combined with clinical observations, patient history, and epidemiological information. The expected result is Negative.  Fact Sheet for Patients:  EntrepreneurPulse.com.au  Fact Sheet for Healthcare Providers:  IncredibleEmployment.be  This test is no t yet approved or cleared by the [place] FDA  and  has been authorized for detection and/or diagnosis of SARS-CoV-2 by FDA under an Emergency Use Authorization (EUA). This EUA will remain  in effect (meaning this test can be used) for the duration of the COVID-19 declaration under Section 564(b)(1) of the Act, 21 U.S.C.section 360bbb-3(b)(1), unless the authorization is terminated  or revoked sooner.       Influenza A by PCR NEGATIVE NEGATIVE Final   Influenza B by PCR NEGATIVE NEGATIVE Final    Comment: (NOTE) The Xpert Xpress SARS-CoV-2/FLU/RSV plus assay is intended as an aid in the diagnosis of influenza from Nasopharyngeal swab specimens and should not be used as a sole basis for treatment. Nasal washings and aspirates are unacceptable for Xpert Xpress SARS-CoV-2/FLU/RSV testing.  Fact Sheet for Patients: EntrepreneurPulse.com.au  Fact Sheet for Healthcare Providers: IncredibleEmployment.be  This test is not yet approved or cleared by the [place] FDA and has been authorized for detection and/or diagnosis of SARS-CoV-2 by FDA under an Emergency Use Authorization (EUA). This EUA will remain in effect (meaning this test can be used) for the duration of the COVID-19 declaration under Section 564(b)(1) of the Act, 21 U.S.C. section 360bbb-3(b)(1), unless the authorization is terminated or revoked.  Performed at [facility], [address] [place], [place] [address]   Surgical pcr screen     Status:  Abnormal   Collection Time: 02/23/21  8:25 PM   Specimen: Nasal Mucosa; Nasal Swab  Result Value Ref Range Status   MRSA, PCR NEGATIVE NEGATIVE Final   Staphylococcus aureus POSITIVE (A) NEGATIVE Final    Comment: (NOTE) The Xpert SA Assay (FDA approved for NASAL specimens in patients 35 years of age and older), is one component of a comprehensive surveillance program. It is not intended to diagnose infection nor to guide or monitor treatment. Performed at [facility], [place] [address] [place], [place] [address]      Studies: CT ABDOMEN PELVIS WO CONTRAST  Result Date: 03/01/2021 CLINICAL DATA:  Abdominal pain, fever, recent cholecystectomy EXAM: CT ABDOMEN AND PELVIS WITHOUT CONTRAST TECHNIQUE: Multidetector CT imaging of the abdomen and pelvis was performed following the standard protocol without IV contrast. COMPARISON:  02/22/2021, 02/21/2021 FINDINGS: Lower chest: There are small bilateral pleural effusions with compressive lower lobe atelectasis. Unenhanced CT was performed per clinician order. Lack of IV contrast limits sensitivity and specificity, especially for evaluation of abdominal/pelvic solid viscera. Hepatobiliary: Postsurgical changes are seen from cholecystectomy. There is a 3.7 x 3.2 cm fluid collection in the gallbladder fossa adjacent to the cholecystectomy clips. Given recent surgical intervention, this could reflect postoperative hematoma or seroma. Evaluation is limited without IV contrast. There is a surgical drain traversing the cholecystectomy site. Extravasated oral contrast is seen along the course of the surgical drain, compatible with perforated duodenum. Please refer to discussion below. Numerous hepatic cysts are identified. No intrahepatic duct dilation. Dilation of the common bile duct measuring 16 mm compatible with cholecystectomy. No evidence of choledocholithiasis. Pancreas: Unremarkable. No pancreatic ductal dilatation or surrounding inflammatory changes. Spleen: Normal in size.  Stable splenic cyst. Adrenals/Urinary Tract: Mild bilateral renal cortical atrophy. No urinary tract calculi or obstructive uropathy. The adrenals and bladder are unremarkable. Stomach/Bowel: As noted above, there is extravasation of oral contrast along the right lateral margin of the proximal duodenum, reference image 41/3, consistent with duodenal perforation. Oral contrast is seen extending into the right upper quadrant along the course of the  indwelling surgical drain. There is no evidence of bowel obstruction. Scattered gas fluid levels are seen throughout the colon, nonspecific. 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 Diffuse atherosclerosis. 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 Reproductive: Prostate is unremarkable. Other: There is a loculated area of fluid within the right lower quadrant measuring 7.4 by 5.6 by 8.3 cm. Fluid is low-attenuation, without any evidence of secondary infection on this limited unenhanced exam. There is a small amount of pneumoperitoneum throughout the upper abdomen, likely a combination of postoperative change, indwelling surgical drain, and the perforated duodenum as described above. No abdominal wall hernia. Body wall edema is identified greatest in the bilateral flanks. Musculoskeletal: There are no acute or destructive bony lesions. Reconstructed images demonstrate no additional findings. IMPRESSION: 1. Perforation of the proximal duodenum, with extravasated oral contrast in the right upper quadrant. 2. Minimal pneumoperitoneum within the upper abdomen, consistent with recent surgical intervention, internal drain, and perforated duodenum as described above. Favor postoperative seroma or hematoma. 3. Postsurgical changes from cholecystectomy, with indwelling surgical drain. Small fluid collection in the gallbladder fossa is nonspecific on this unenhanced exam. 4. Loculated simple appearing fluid in the right lower quadrant, nonspecific. No internal gas or septations identified on this limited unenhanced exam. 5. No evidence of bowel obstruction or ileus. 6. 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 Recommend follow-up ultrasound every 3 years. This recommendation follows ACR consensus guidelines: White Paper of the ACR Incidental Findings  Committee II on Vascular Findings. J Am Coll Radiol 2013; 10:789-794. 7. Bilateral pleural effusions with compressive atelectasis of the bilateral lower lobes. Critical Value/emergent results were called by telephone at the time of interpretation on 03/01/2021 at 4:39 pm to provider [facility], who verbally acknowledged these results. Electronically Signed   By: Randa Ngo M.D.   On: 03/01/2021 16:45   DG Chest Portable 1 View  Result Date: 03/02/2021 CLINICAL DATA:  Nasogastric tube insertion EXAM: PORTABLE CHEST 1 VIEW COMPARISON:  02/21/2021 FINDINGS: Enteric tube with tip and side-port over the stomach. Postoperative right upper quadrant with drain. Worsening chest with hazy density on the right more than left. No superimposed Kerley lines or pneumothorax. Normal heart size. IMPRESSION: 1. New enteric tube in good position. 2. Low volume chest with atelectasis and pleural effusions. Electronically Signed   By: Monte Fantasia M.D.   On: 03/02/2021 04:06   [place] EKG SITE RITE  Result Date: 03/02/2021 If Site Rite image not attached, placement could not be confirmed due to current cardiac rhythm.    Flora Lipps, MD  [facility] 03/03/2021  If 7PM-7AM, please contact night-coverage

## 2021-03-03 NOTE — Progress Notes (Signed)
PHARMACY - TOTAL PARENTERAL NUTRITION CONSULT NOTE  Indication: Prolonged ileus  Patient Measurements: Weight: 71.9 kg (158 lb 8.2 oz)   Body mass index is 24.83 kg/m.  Assessment:  23 YOM presented to [facility] with abdominal pain, found to have acute pancreatitis in setting of choledocholithiasis and transferred to [facility] on 02/22/21.  Underwent ex-lap for gallstone pancreatitis on 5/1.  Had contrast extravasation from proximal duodenum with bilious output and underwent ex-lap with repair of duodenal perforation with [name] patch on 5/4.  Patient has inadequate nutrition for 9 days and Pharmacy consulted to dose TPN.  Patient has been on a clear liquid diet with intermittent NPO status.  Diet advanced to soft post-surgery on 5/2, but soon reverted to NPO for second surgery on 5/4.  Per documentation, no recent weight changes and patient was eating well PTA.  Glucose / Insulin: no hx DM - AM glucose < 180 Electrolytes: Phos 2, others WNL Renal: SCr down 1.16, BUN down to 34 Hepatic: LFTs / tbili normalized, lipase down to 92, BL prealbumin low at 5, albumin 1.9, TG WNL Intake / Output; MIVF: UOP 0.8 ml/kg/hr, NG , drain 26mL, LR at 125 ml/hr GI Imaging: none since TPN start GI Surgeries / Procedures: none since TPN start  Central access:  PICC placed 03/02/21 TPN start date: 03/03/21  Nutritional Goals (per RD rec on 5/5): 1900-2100 kCal, 90-105g AA and >1.9L fluid per day  Current Nutrition:  NPO  Plan:  Initiate TPN at 35 ml/hr at 1800 (goal rate 80 ml/hr) Electrolytes in TPN: Na 38mEq/L, K 66mEq/L, Ca 70mEq/L, Mag 27mEq/L, Phos 16mmol/L, Cl:Ac 1:2 Add multivitamin and trace elements to TPN (D/C PO multivitamin) Start sensitive SSI Q6H KPhos 30 mmol IV x 1 Reduce LR to 90 when TPN starts F/U AM labs, monitor for possible refeeding  Ilithyia Titzer D. Laney Potash, PharmD, BCPS, BCCCP 03/03/2021, 7:32 AM

## 2021-03-03 NOTE — Progress Notes (Addendum)
Progress Note  2 Days Post-Op  Subjective: CC: reports poor sleep 2/2 abdominal pain. Also c/o dry mouth and throat discomfort. Patient had a loose BM and some flatus yesterday. Denies fever, chills, CP, or SOB. Remains on 2 L O2 via mask because of O2 sats 88% ORA. Foley remains in place. Pt did ask when he will be able to eat. Family member at bedside. Video interpreter used to gather history and answer patient questions.    Objective: Vital signs in last 24 hours: Temp:  [97.4 F (36.3 C)-98.5 F (36.9 C)] 98.5 F (36.9 C) (05/06 0733) Pulse Rate:  [80-95] 94 (05/06 0733) Resp:  [17-23] 20 (05/06 0733) BP: (146-164)/(64-76) 164/74 (05/06 0733) SpO2:  [89 %-98 %] 97 % (05/06 0733) Weight:  [71.9 kg-73.1 kg] 71.9 kg (05/06 0347) Last BM Date: 03/01/21  Intake/Output from previous day: 05/05 0701 - 05/06 0700 In: 1702 [I.V.:1212.2; IV Piggyback:489.8] Out: 1610 [Urine:1400; Emesis/NG output:150; Drains:60] Intake/Output this shift: Total I/O In: -  Out: 300 [Urine:300]  PE: General: pleasant male laying in bed in NAD, appears uncomfortable. HEENT: head is normocephalic, atraumatic.  Sclera are noninjected. Mouth is pink and moist Heart: regular, rate, and rhythm.  Lungs: CTAB, diminished breath sounds lung bases - no wheezes, rhonchi, or rales. Respiratory effort nonlabored on 2L O2 via mask Abd: soft, mildly distended, present but hypoactive bowel sounds, Expected mild tenderness to palpation diffusely. Midline staples visible through honeycomb bandage - intact without erythema or discharge. JP with SS output MS: all 4 extremities are symmetrical with no cyanosis, clubbing, or edema. No calf tenderness to palpation Skin: warm and dry with no masses, lesions, or rashes Psych: A&Ox3 with an appropriate affect.    Lab Results:  Recent Labs    03/02/21 0205 03/03/21 0412  WBC 14.4* 20.5*  HGB 10.9* 10.8*  HCT 32.8* 32.3*  PLT 194 266   BMET Recent Labs     03/02/21 0205 03/03/21 0412  NA 137 140  K 4.0 4.0  CL 110 110  CO2 20* 22  GLUCOSE 127* 104*  BUN 44* 34*  CREATININE 1.50* 1.16  CALCIUM 7.4* 8.0*   PT/INR No results for input(s): LABPROT, INR in the last 72 hours. CMP     Component Value Date/Time   NA 140 03/03/2021 0412   K 4.0 03/03/2021 0412   CL 110 03/03/2021 0412   CO2 22 03/03/2021 0412   GLUCOSE 104 (H) 03/03/2021 0412   BUN 34 (H) 03/03/2021 0412   CREATININE 1.16 03/03/2021 0412   CALCIUM 8.0 (L) 03/03/2021 0412   PROT 4.9 (L) 03/03/2021 0412   ALBUMIN 1.9 (L) 03/03/2021 0412   AST 19 03/03/2021 0412   ALT 16 03/03/2021 0412   ALKPHOS 124 03/03/2021 0412   BILITOT 0.8 03/03/2021 0412   GFRNONAA >60 03/03/2021 0412   Lipase     Component Value Date/Time   LIPASE 92 (H) 02/25/2021 0052       Studies/Results: CT ABDOMEN PELVIS WO CONTRAST  Result Date: 03/01/2021 CLINICAL DATA:  Abdominal pain, fever, recent cholecystectomy EXAM: CT ABDOMEN AND PELVIS WITHOUT CONTRAST TECHNIQUE: Multidetector CT imaging of the abdomen and pelvis was performed following the standard protocol without IV contrast. COMPARISON:  02/22/2021, 02/21/2021 FINDINGS: Lower chest: There are small bilateral pleural effusions with compressive lower lobe atelectasis. Unenhanced CT was performed per clinician order. Lack of IV contrast limits sensitivity and specificity, especially for evaluation of abdominal/pelvic solid viscera. Hepatobiliary: Postsurgical changes are seen from cholecystectomy. There  is a 3.7 x 3.2 cm fluid collection in the gallbladder fossa adjacent to the cholecystectomy clips. Given recent surgical intervention, this could reflect postoperative hematoma or seroma. Evaluation is limited without IV contrast. There is a surgical drain traversing the cholecystectomy site. Extravasated oral contrast is seen along the course of the surgical drain, compatible with perforated duodenum. Please refer to discussion below. Numerous  hepatic cysts are identified. No intrahepatic duct dilation. Dilation of the common bile duct measuring 16 mm compatible with cholecystectomy. No evidence of choledocholithiasis. Pancreas: Unremarkable. No pancreatic ductal dilatation or surrounding inflammatory changes. Spleen: Normal in size.  Stable splenic cyst. Adrenals/Urinary Tract: Mild bilateral renal cortical atrophy. No urinary tract calculi or obstructive uropathy. The adrenals and bladder are unremarkable. Stomach/Bowel: As noted above, there is extravasation of oral contrast along the right lateral margin of the proximal duodenum, reference image 41/3, consistent with duodenal perforation. Oral contrast is seen extending into the right upper quadrant along the course of the indwelling surgical drain. There is no evidence of bowel obstruction. Scattered gas fluid levels are seen throughout the colon, nonspecific. 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 Diffuse atherosclerosis. 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 Reproductive: Prostate is unremarkable. Other: There is a loculated area of fluid within the right lower quadrant measuring 7.4 by 5.6 by 8.3 cm. Fluid is low-attenuation, without any evidence of secondary infection on this limited unenhanced exam. There is a small amount of pneumoperitoneum throughout the upper abdomen, likely a combination of postoperative change, indwelling surgical drain, and the perforated duodenum as described above. No abdominal wall hernia. Body wall edema is identified greatest in the bilateral flanks. Musculoskeletal: There are no acute or destructive bony lesions. Reconstructed images demonstrate no additional findings. IMPRESSION: 1. Perforation of the proximal duodenum, with extravasated oral contrast in the right upper quadrant. 2. Minimal pneumoperitoneum within the upper abdomen,  consistent with recent surgical intervention, internal drain, and perforated duodenum as described above. Favor postoperative seroma or hematoma. 3. Postsurgical changes from cholecystectomy, with indwelling surgical drain. Small fluid collection in the gallbladder fossa is nonspecific on this unenhanced exam. 4. Loculated simple appearing fluid in the right lower quadrant, nonspecific. No internal gas or septations identified on this limited unenhanced exam. 5. No evidence of bowel obstruction or ileus. 6. 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 Recommend follow-up ultrasound every 3 years. This recommendation follows ACR consensus guidelines: White Paper of the ACR Incidental Findings Committee II on Vascular Findings. J Am Coll Radiol 2013; 10:789-794. 7. Bilateral pleural effusions with compressive atelectasis of the bilateral lower lobes. Critical Value/emergent results were called by telephone at the time of interpretation on 03/01/2021 at 4:39 pm to provider [facility], who verbally acknowledged these results. Electronically Signed   By: Sharlet Salina M.D.   On: 03/01/2021 16:45   DG Chest Portable 1 View  Result Date: 03/02/2021 CLINICAL DATA:  Nasogastric tube insertion EXAM: PORTABLE CHEST 1 VIEW COMPARISON:  02/21/2021 FINDINGS: Enteric tube with tip and side-port over the stomach. Postoperative right upper quadrant with drain. Worsening chest with hazy density on the right more than left. No superimposed Kerley lines or pneumothorax. Normal heart size. IMPRESSION: 1. New enteric tube in good position. 2. Low volume chest with atelectasis and pleural effusions. Electronically Signed   By: Marnee Spring M.D.   On: 03/02/2021 04:06  [place] EKG SITE RITE  Result Date: 03/02/2021 If Site Rite image not attached, placement could not be confirmed due to current cardiac rhythm.   Anti-infectives: Anti-infectives (From admission, onward)    Start     Dose/Rate Route Frequency Ordered Stop   03/01/21 1800  piperacillin-tazobactam (ZOSYN) IVPB 3.375 g        3.375 g 12.5 mL/hr over 240 Minutes Intravenous Every 8 hours 03/01/21 1732     02/26/21 1500  metroNIDAZOLE (FLAGYL) IVPB 500 mg  Status:  Discontinued        500 mg 100 mL/hr over 60 Minutes Intravenous Every 8 hours 02/26/21 1414 03/01/21 1732   02/22/21 2000  cefTRIAXone (ROCEPHIN) 2 g in sodium chloride 0.9 % 100 mL IVPB  Status:  Discontinued        2 g 200 mL/hr over 30 Minutes Intravenous Daily at 10 pm 02/22/21 1339 03/01/21 1716   02/22/21 1600  metroNIDAZOLE (FLAGYL) IVPB 500 mg  Status:  Discontinued        500 mg 100 mL/hr over 60 Minutes Intravenous Every 8 hours 02/22/21 1339 02/26/21 1414       Assessment/Plan  Elevated troponin- 32 >174 >112, EKG without ischemia changes,cards cleared for surgery E.coli, Enterobacterales bacteremia - IV abx per primary team   Acute biliary pancreatitis, suspect resolving ascending cholangitis, gangrenous cholecystitis  s/p lap chole with Dr. Dwain Sarna on 5/1 - POD#5 - operative findings of liver cysts and cirrhosis - will need PCP/GI follow up   Duodenal perforation s/p exp lap with primary repair of duodenal perforation with [name] patch - Dr. Freida Busman 5/4 POD#2 - AFVSS, WBC 20 from 14 - keep NGT to low intermittent wall suction. NGT to remain until after UGI on Monday 5/9 - strict NPO, no meds PO or per tube. - adjust pain meds -- re-order IV tylenol, add lidoderm patch, add 500 mg IV robaxin q8h PRN (discussed with RN, will try robaxin today and if patient has adverse effects like confusion/somnolence then will D/C).  - D/C foley today. - no IS at bedside. Re-ordered and discussed with RN. Will also order flutter valve. CXR yesterday w/ low lung volumes and increased R>L pulm edema. Will discuss with TRH - pt may benefit from diuresis.  - PT/OT - last seen 5/5 and recommending home with HH; OOB to chair today -  continue JP and monitor  FEN: NPO, TPN ID: ceftriaxone 4/27>5/3, flagyl 4/26>5/4, zosyn 5/4>> Foley: Remove today 5/6, ok for external cath VTE: SCDs, start chemical VTE today  Dispo: SDU, monitor CBC, adjust pain meds, VTE ppx, PT/OT   LOS: 9 days    Travis Gallagher, [facility] 03/03/2021, 8:35 AM Please see Amion for pager number during day hours 7:00am-4:30pm

## 2021-03-04 LAB — GLUCOSE, CAPILLARY
Glucose-Capillary: 141 mg/dL — ABNORMAL HIGH (ref 70–99)
Glucose-Capillary: 149 mg/dL — ABNORMAL HIGH (ref 70–99)
Glucose-Capillary: 151 mg/dL — ABNORMAL HIGH (ref 70–99)
Glucose-Capillary: 155 mg/dL — ABNORMAL HIGH (ref 70–99)
Glucose-Capillary: 165 mg/dL — ABNORMAL HIGH (ref 70–99)
Glucose-Capillary: 180 mg/dL — ABNORMAL HIGH (ref 70–99)

## 2021-03-04 LAB — COMPREHENSIVE METABOLIC PANEL
ALT: 13 U/L (ref 0–44)
AST: 21 U/L (ref 15–41)
Albumin: 1.8 g/dL — ABNORMAL LOW (ref 3.5–5.0)
Alkaline Phosphatase: 124 U/L (ref 38–126)
Anion gap: 5 (ref 5–15)
BUN: 26 mg/dL — ABNORMAL HIGH (ref 8–23)
CO2: 28 mmol/L (ref 22–32)
Calcium: 7.6 mg/dL — ABNORMAL LOW (ref 8.9–10.3)
Chloride: 106 mmol/L (ref 98–111)
Creatinine, Ser: 1 mg/dL (ref 0.61–1.24)
GFR, Estimated: >60 mL/min (ref >60)
Glucose, Bld: 120 mg/dL — ABNORMAL HIGH (ref 70–99)
Potassium: 3.9 mmol/L (ref 3.5–5.1)
Sodium: 139 mmol/L (ref 135–145)
Total Bilirubin: 0.8 mg/dL (ref 0.3–1.2)
Total Protein: 4.7 g/dL — ABNORMAL LOW (ref 6.5–8.1)

## 2021-03-04 LAB — CBC
HCT: 30.1 % — ABNORMAL LOW (ref 39.0–52.0)
Hemoglobin: 10.1 g/dL — ABNORMAL LOW (ref 13.0–17.0)
MCH: 30.5 pg (ref 26.0–34.0)
MCHC: 33.6 g/dL (ref 30.0–36.0)
MCV: 90.9 fL (ref 80.0–100.0)
Platelets: 221 10*3/uL (ref 150–400)
RBC: 3.31 MIL/uL — ABNORMAL LOW (ref 4.22–5.81)
RDW: 14.1 % (ref 11.5–15.5)
WBC: 13.5 10*3/uL — ABNORMAL HIGH (ref 4.0–10.5)
nRBC: 0 % (ref 0.0–0.2)

## 2021-03-04 LAB — PHOSPHORUS: Phosphorus: 2.9 mg/dL (ref 2.5–4.6)

## 2021-03-04 LAB — MAGNESIUM: Magnesium: 1.7 mg/dL (ref 1.7–2.4)

## 2021-03-04 MED ORDER — TRAVASOL 10 % IV SOLN
INTRAVENOUS | Status: AC
  Filled 2021-03-04: qty 940.8

## 2021-03-04 MED ORDER — INSULIN ASPART 100 UNIT/ML IJ SOLN
0.0000 [IU] | INTRAMUSCULAR | Status: DC
  Administered 2021-03-04 – 2021-03-05 (×4): 2 [IU] via SUBCUTANEOUS
  Administered 2021-03-05: 1 [IU] via SUBCUTANEOUS
  Administered 2021-03-05 – 2021-03-06 (×5): 2 [IU] via SUBCUTANEOUS
  Administered 2021-03-06: 1 [IU] via SUBCUTANEOUS
  Administered 2021-03-06 – 2021-03-07 (×5): 2 [IU] via SUBCUTANEOUS
  Administered 2021-03-07: 1 [IU] via SUBCUTANEOUS
  Administered 2021-03-07 (×2): 2 [IU] via SUBCUTANEOUS
  Administered 2021-03-07 (×2): 1 [IU] via SUBCUTANEOUS
  Administered 2021-03-07: 2 [IU] via SUBCUTANEOUS
  Administered 2021-03-08: 1 [IU] via SUBCUTANEOUS

## 2021-03-04 MED ORDER — FUROSEMIDE 10 MG/ML IJ SOLN
40.0000 mg | Freq: Once | INTRAMUSCULAR | Status: AC
  Administered 2021-03-04: 40 mg via INTRAVENOUS
  Filled 2021-03-04: qty 4

## 2021-03-04 MED ORDER — MAGNESIUM SULFATE 2 GM/50ML IV SOLN
2.0000 g | Freq: Once | INTRAVENOUS | Status: AC
  Administered 2021-03-04: 2 g via INTRAVENOUS
  Filled 2021-03-04: qty 50

## 2021-03-04 MED ORDER — POTASSIUM CHLORIDE 10 MEQ/50ML IV SOLN
10.0000 meq | INTRAVENOUS | Status: AC
  Administered 2021-03-04 (×2): 10 meq via INTRAVENOUS
  Filled 2021-03-04 (×2): qty 50

## 2021-03-04 MED ORDER — LACTATED RINGERS IV SOLN: INTRAVENOUS | Status: DC

## 2021-03-04 NOTE — Progress Notes (Signed)
PROGRESS NOTE  Travis Gallagher JEH:[id number] DOB: 03-Apr-1931 DOA: 02/22/2021 PCP: Pcp, No   LOS: 10 days   Brief narrative:  Travis Gerold Rodriguezis a 87 y.o.malewith medical history significant forhypertension, GERDwas admitted to hospital as transfer from [facility] for management of acute pancreatitis in the setting of potential choledocholithiasis.  Patient was also found to have acute biliary pancreatitis.  GI was initially consulted but MRCP did not show choledocholithiasis.  Patient underwent laparoscopic cholecystectomy on 02/26/2021.  Cardiology was consulted for preop risk stratification.    During hospitalization, post -operatively patient continued to have abdominal pain and distention with renal failure.  He was noted to have duodenal perforation and biliary leak so underwent relaparotomy on 03/01/2021.  Patient was continued on TPN and NG tube.  General surgery following   Assessment/Plan:    Principal Problem:   Acute pancreatitis Active Problems:   Severe sepsis (HCC)   Cholangitis   Abdominal pain   Nausea   Hypertension   GERD (gastroesophageal reflux disease)   Elevated troponin  Acute biliary pancreatitis:  Patient underwent laparoscopic cholecystectomy by general surgery on 02/26/2021.  Postoperatively, patient continued to have increasing pain and distention and renal failure.  CT scan of the abdomen was performed which showed signs of biliary leak.  Patient was then taken back to the OR on 03/01/2021 and was noted to have a duodenal perforation.  He underwent laparotomy with primary repair of duodenal perforation with Phillip Heal patch by Dr. Zenia Resides on 03/01/21.    Continue NG tube to suction.  Nasogastric tube to remain on till 5/ 9.  Plan for upper GI study on 5/ 9.  Continue IV Zosyn due to duodenal perforation.  Continue TPN for nutrition.  Patient is currently strict NPO.  Duodenal perforation, biliary leak.  Status post exploratory laparotomy with repair of  duodenal perforation on 03/01/2021.    Continue IV Zosyn due to biliary leak.  Continue IV fluids for now.  Mild leukocytosis but trending down, we will continue to monitor closely.  Afebrile.  T-max of 98.6 F.  Mild shortness of breath, hypoxia.   Will Repeat 1 dose of IV Lasix today.  Liver cyst and cirrhosis during surgical intervention.  Will need outpatient PCP and GI follow-up.    Severe sepsis/enterobacter and E. coli bacteremia secondary to ascending cholangitis, gangrenous cholecystitis Currently on IV Zosyn secondary to duodenal perforation.  Patient initially met sepsis criteria on admission.    Sore throat from NG tube.  Continue supportive care with  Cepacol and Chloraseptic.   Elevated troponin with LV systolic dysfunction.: EKG did not show any ischemic changes.  Possibly secondary to demand ischemia . 2D echocardiogram with left ventricular ejection fraction of 40 to 45% with diffuse hypokinesis and abnormal septal motion wall.  Cardiology followed the patient during hospitalization.  Aspirin on hold due to recent postop status.  Will need outpatient follow-up with cardiology.  Was on Coreg, has been changed to metoprolol IV.  Will need ARB at some point.  Mild Dilation of acending aorta Follow-up as outpatient.  PSVT- 2D echo with EF of 45 to 50%.  On IV metoprolol at this time.  Was on Coreg.  Will need repeat TSH follow-up as outpatient.  Acute kidney injury Improved.  Latest creatinine of 1.1   Essential hypertension: Not on antihypertensives at home.  Was on Coreg has been changed to metoprolol IV.  IV hydralazine has been added for better control.  Continue to monitor.   GERD: Continue IV  PPI   Debility, weakness.  Physical therapy has seen the patient and recommend home health PT on discharge.  Hypophosphatemia.   Improved after replacement.  Latest phosphate of 2.9.  DVT prophylaxis: Place TED hose Start: 03/03/21 1742 enoxaparin (LOVENOX)  injection 40 mg Start: 03/03/21 1000 Place and maintain sequential compression device Start: 02/23/21 1520 SCDs Start: 02/22/21 1225   Code Status: Full code  Family Communication:  None today.  I spoke with the patient's daughter at bedside.  Status is: Inpatient  Remains inpatient appropriate because:Unsafe d/c plan, IV treatments appropriate due to intensity of illness or inability to take PO and Inpatient level of care appropriate due to severity of illness, status post exploratory laparotomy.   Dispo: The patient is from: Home              Anticipated d/c is to: Home with home health              Patient currently is not medically stable to d/c.   Difficult to place patient No  Consultants:  GI  Cardiology  General surgery  Procedures:  Laparoscopic cholecystectomy 02/26/2021  Exploratory laparotomy with primary repair of duodenal perforation with Phillip Heal patch on 03/01/2021  NG tube placement.  Right upper extremity PICC placement on 03/02/2021  Anti-infectives:  Marland Kitchen Ceftriaxone and metronidazole IV 4/27>5/4 . Zosyn 03/01/2021>  Anti-infectives (From admission, onward)   Start     Dose/Rate Route Frequency Ordered Stop   03/01/21 1800  piperacillin-tazobactam (ZOSYN) IVPB 3.375 g        3.375 g 12.5 mL/hr over 240 Minutes Intravenous Every 8 hours 03/01/21 1732     02/26/21 1500  metroNIDAZOLE (FLAGYL) IVPB 500 mg  Status:  Discontinued        500 mg 100 mL/hr over 60 Minutes Intravenous Every 8 hours 02/26/21 1414 03/01/21 1732   02/22/21 2000  cefTRIAXone (ROCEPHIN) 2 g in sodium chloride 0.9 % 100 mL IVPB  Status:  Discontinued        2 g 200 mL/hr over 30 Minutes Intravenous Daily at 10 pm 02/22/21 1339 03/01/21 1716   02/22/21 1600  metroNIDAZOLE (FLAGYL) IVPB 500 mg  Status:  Discontinued        500 mg 100 mL/hr over 60 Minutes Intravenous Every 8 hours 02/22/21 1339 02/26/21 1414     Subjective: Today, patient was seen and examined at bedside with help of  Spanish interpreter.  Patient complains of generalized weakness and mild abdominal pain.  Denies any nausea or vomiting.    Objective: Vitals:   03/04/21 0012 03/04/21 0438  BP: (!) 174/77 (!) 177/76  Pulse: 85 89  Resp: 20 20  Temp: 97.6 F (36.4 C) 98.2 F (36.8 C)  SpO2: 97% 98%    Intake/Output Summary (Last 24 hours) at 03/04/2021 0829 Last data filed at 03/03/2021 2130 Gross per 24 hour  Intake 1736.33 ml  Output 1930 ml  Net -193.67 ml   Filed Weights   03/02/21 0514 03/02/21 1534 03/03/21 0347  Weight: 70.8 kg 73.1 kg 71.9 kg   Body mass index is 24.83 kg/m.   Physical Exam: General:  Average built, not in obvious distress, NG tube in place, on facemask, appears weak and deconditioned, appears ill. HENT:   No scleral pallor or icterus noted. Oral mucosa is moist.  Chest:   Diminished breath sounds bilaterally.   CVS: S1 &S2 heard. No murmur.  Regular rate and rhythm. Abdomen: Soft, appropriately tender on palpation, distended, JP drain in  place, midline staples visible. Extremities: No cyanosis, clubbing but bilateral lower extremity edema noted.  Peripheral pulses are palpable.  Right upper extremity PICC line in place Psych: Alert, awake and communicative, CNS: Generalized weakness noted..  Moves extremities. Skin: Warm and dry.  Status post exploratory laparotomy  Data Review: I have personally reviewed the following laboratory data and studies,  CBC: Recent Labs  Lab 02/28/21 0215 03/01/21 0242 03/02/21 0205 03/03/21 0412 03/04/21 0300  WBC 11.9* 13.5* 14.4* 20.5* 13.5*  NEUTROABS  --   --   --  18.0*  --   HGB 12.5* 10.9* 10.9* 10.8* 10.1*  HCT 37.4* 32.9* 32.8* 32.3* 30.1*  MCV 87.6 89.2 89.1 88.5 90.9  PLT 177 181 194 266 888   Basic Metabolic Panel: Recent Labs  Lab 02/28/21 0215 03/01/21 0242 03/02/21 0205 03/03/21 0412 03/04/21 0410  NA 138 136 137 140 139  K 3.6 3.5 4.0 4.0 3.9  CL 108 108 110 110 106  CO2 21* 23 20* 22 28  GLUCOSE  142* 117* 127* 104* 120*  BUN 36* 50* 44* 34* 26*  CREATININE 1.45* 2.40* 1.50* 1.16 1.00  CALCIUM 7.3* 7.1* 7.4* 8.0* 7.6*  MG  --   --   --  2.0 1.7  PHOS  --   --   --  2.0* 2.9   Liver Function Tests: Recent Labs  Lab 02/27/21 0048 03/01/21 0242 03/03/21 0412 03/04/21 0410  AST 54* 59* 19 21  ALT 39 _0 ALKPHOS 150* 203* 124 124  BILITOT 1.0 0.8 0.8 0.8  PROT 5.5* 4.5* 4.9* 4.7*  ALBUMIN 2.0* 1.7* 1.9* 1.8*   No results for input(s): LIPASE, AMYLASE in the last 168 hours. No results for input(s): AMMONIA in the last 168 hours. Cardiac Enzymes: No results for input(s): CKTOTAL, CKMB, CKMBINDEX, TROPONINI in the last 168 hours. BNP (last 3 results) No results for input(s): BNP in the last 8760 hours.  ProBNP (last 3 results) No results for input(s): PROBNP in the last 8760 hours.  CBG: Recent Labs  Lab 03/03/21 1835 03/04/21 0016 03/04/21 0614  GLUCAP 160* 149* 141*   Recent Results (from the past 240 hour(s))  Surgical pcr screen     Status: Abnormal   Collection Time: 02/23/21  8:25 PM   Specimen: Nasal Mucosa; Nasal Swab  Result Value Ref Range Status   MRSA, PCR NEGATIVE NEGATIVE Final   Staphylococcus aureus POSITIVE (A) NEGATIVE Final    Comment: (NOTE) The Xpert SA Assay (FDA approved for NASAL specimens in patients 44 years of age and older), is one component of a comprehensive surveillance program. It is not intended to diagnose infection nor to guide or monitor treatment. Performed at [facility], [place] [address] [place], [place] [address]      Studies: [place] EKG SITE RITE  Result Date: 03/02/2021 If Site Rite image not attached, placement could not be confirmed due to current cardiac rhythm.    Flora Lipps, MD  [facility] 03/04/2021  If 7PM-7AM, please contact night-coverage

## 2021-03-04 NOTE — Progress Notes (Signed)
Progress Note  3 Days Post-Op  Subjective: CC:  Some abdominal pain controlled with current regimen; nares and throat sore.   Objective: Vital signs in last 24 hours: Temp:  [97.6 F (36.4 C)-98.5 F (36.9 C)] 98.5 F (36.9 C) (05/07 0841) Pulse Rate:  [84-99] 86 (05/07 0841) Resp:  [20-23] 20 (05/07 0841) BP: (126-181)/(57-88) 177/85 (05/07 0911) SpO2:  [96 %-98 %] 97 % (05/07 0841) Last BM Date: 03/02/21  Intake/Output from previous day: 05/06 0701 - 05/07 0700 In: 1736.3 [I.V.:980.5; IV Piggyback:755.9] Out: 2630 [Urine:2600; Drains:30] Intake/Output this shift: No intake/output data recorded.  PE: General: pleasant male laying in bed in NAD, appears uncomfortable. HEENT: head is normocephalic, atraumatic.  Sclera are noninjected. Mouth is pink and moist Heart: nontachycardic Lungs: 2 L on face mask, breathing comfortably Abd: soft, flat; Midline staples visible through honeycomb bandage - intact without erythema or discharge. JP with SS output Skin: warm and dry with no masses, lesions, or rashes Psych: alert, interactive, appropriate   Lab Results:  Recent Labs    03/03/21 0412 03/04/21 0300  WBC 20.5* 13.5*  HGB 10.8* 10.1*  HCT 32.3* 30.1*  PLT 266 221   BMET Recent Labs    03/03/21 0412 03/04/21 0410  NA 140 139  K 4.0 3.9  CL 110 106  CO2 22 28  GLUCOSE 104* 120*  BUN 34* 26*  CREATININE 1.16 1.00  CALCIUM 8.0* 7.6*   PT/INR No results for input(s): LABPROT, INR in the last 72 hours. CMP     Component Value Date/Time   NA 139 03/04/2021 0410   K 3.9 03/04/2021 0410   CL 106 03/04/2021 0410   CO2 28 03/04/2021 0410   GLUCOSE 120 (H) 03/04/2021 0410   BUN 26 (H) 03/04/2021 0410   CREATININE 1.00 03/04/2021 0410   CALCIUM 7.6 (L) 03/04/2021 0410   PROT 4.7 (L) 03/04/2021 0410   ALBUMIN 1.8 (L) 03/04/2021 0410   AST 21 03/04/2021 0410   ALT 13 03/04/2021 0410   ALKPHOS 124 03/04/2021 0410   BILITOT 0.8 03/04/2021 0410   GFRNONAA  >60 03/04/2021 0410   Lipase     Component Value Date/Time   LIPASE 92 (H) 02/25/2021 0052       Studies/Results: [place] EKG SITE RITE  Result Date: 03/02/2021 If Site Rite image not attached, placement could not be confirmed due to current cardiac rhythm.   Anti-infectives: Anti-infectives (From admission, onward)   Start     Dose/Rate Route Frequency Ordered Stop   03/01/21 1800  piperacillin-tazobactam (ZOSYN) IVPB 3.375 g        3.375 g 12.5 mL/hr over 240 Minutes Intravenous Every 8 hours 03/01/21 1732     02/26/21 1500  metroNIDAZOLE (FLAGYL) IVPB 500 mg  Status:  Discontinued        500 mg 100 mL/hr over 60 Minutes Intravenous Every 8 hours 02/26/21 1414 03/01/21 1732   02/22/21 2000  cefTRIAXone (ROCEPHIN) 2 g in sodium chloride 0.9 % 100 mL IVPB  Status:  Discontinued        2 g 200 mL/hr over 30 Minutes Intravenous Daily at 10 pm 02/22/21 1339 03/01/21 1716   02/22/21 1600  metroNIDAZOLE (FLAGYL) IVPB 500 mg  Status:  Discontinued        500 mg 100 mL/hr over 60 Minutes Intravenous Every 8 hours 02/22/21 1339 02/26/21 1414       Assessment/Plan  Elevated troponin- 32 >174 >112, EKG without ischemia changes,cards cleared for surgery E.coli,  Enterobacterales bacteremia - IV abx per primary team   Acute biliary pancreatitis, suspect resolving ascending cholangitis, gangrenous cholecystitis  s/p lap chole with Dr. Dwain Sarna on 5/1 - POD#6 - operative findings of liver cysts and cirrhosis - will need PCP/GI follow up   Duodenal perforation s/p exp lap with primary repair of duodenal perforation with [name] patch - Dr. Freida Busman 5/4 POD#3 - AFVSS, WBC 13 from 20 - keep NGT to low intermittent wall suction. NGT to remain until after UGI on Monday 5/9 - strict NPO, no meds PO or per tube. - appears more comfortable today with current regimen - urinating into assistive device; Foley d/c 5/6 without evidence of retention - no IS at bedside. Re-ordered and discussed  with RN. Will also order flutter valve. CXR yesterday w/ low lung volumes and increased R>L pulm edema. Will discuss with TRH - pt may benefit from diuresis.  - PT/OT - last seen 5/5 and recommending home with HH; OOB to chair today - continue JP and monitor  FEN: NPO, TPN ID: ceftriaxone 4/27>5/3, flagyl 4/26>5/4, zosyn 5/4>> Foley: Remove today 5/6, ok for external cath VTE: SCDs, start chemical VTE today  Dispo: SDU, monitor CBC, adjust pain meds, VTE ppx, PT/OT   LOS: 10 days    Discussed with Dr. Derrell Lolling.  Luan Pulling, MD [facility] 03/04/2021, 10:33 AM Please see Amion for pager number during day hours 7:00am-4:30pm

## 2021-03-04 NOTE — Progress Notes (Signed)
PT Cancellation Note  Patient Details Name: Courtland Reas MRN: [id number] DOB: October 28, 1931   Cancelled Treatment:    Reason Eval/Treat Not Completed: Other (comment) (defer due to tachycardia at rest (HR >150's bpm briefly and current >100 bpm resting) and pt somewhat lethargic.) Will continue efforts per PT POC as schedule permits.  Dorathy Kinsman Ana Woodroof 03/04/2021, 4:32 PM

## 2021-03-04 NOTE — Progress Notes (Signed)
03/04/2021 7:57 PM Pt pulled NGT out.  Dr. Renato Gails notified. Orders received to not replace tube. Travis Gallagher

## 2021-03-04 NOTE — Progress Notes (Signed)
PHARMACY - TOTAL PARENTERAL NUTRITION CONSULT NOTE  Indication: Prolonged ileus  Patient Measurements: Weight: 71.9 kg (158 lb 8.2 oz)   Body mass index is 24.83 kg/m.  Assessment:  11 YOM presented to [facility] with abdominal pain, found to have acute pancreatitis in setting of choledocholithiasis and transferred to [facility] on 02/22/21. Underwent ex-lap for gallstone pancreatitis on 5/1. Had contrast extravasation from proximal duodenum with bilious output and underwent ex-lap with repair of duodenal perforation with [name] patch on 5/4. Patient has inadequate nutrition for 9 days, and Pharmacy consulted to dose TPN.  Patient has been on a clear liquid diet with intermittent NPO status. Diet advanced to soft post-surgery on 5/2 but soon reverted to NPO for second surgery on 5/4. Per documentation, no recent weight changes and patient was eating well PTA.  Glucose / Insulin: no hx DM - CBGs controlled. Utilized 4 units SSI in last 24hrs Electrolytes: K 3.9 (goal >/=4 with ileus), Phos up to 2.9 (s/p Kphos IV x 1 yesterday), Mag down to 1.7 (goal >/=2 with ileus), others WNL Renal: SCr down 1, BUN down to 26. S/p Lasix 40mg  IV x 1 yesterday Hepatic: LFTs / Tbili / TG WNL, lipase down to 92. BL prealbumin low at 5, albumin 1.8 Intake / Output; MIVF: UOP 1.5 ml/kg/hr, NGT output 96mL/24hrs, drain output 32mL/24hrs; MIVF: LR at 90 ml/hr GI Imaging: none since TPN start GI Surgeries / Procedures: none since TPN start  Central access:  PICC placed 03/02/21 TPN start date: 03/03/21  Nutritional Goals (per RD rec on 5/5): 1900-2100 kCal, 90-105g AA, and >1.9L fluid per day  Current Nutrition:  TPN; NPO  Plan:  Advance TPN to goal rate 80 ml/hr at 1800 Electrolytes in TPN: continue same today, lytes will incresae with rate increase - Na 64mEq/L, K to 67mEq/L, Ca 17mEq/L, Mag 60mEq/L, Phos 59mmol/L, Cl:Ac 1:2 Give Mag sulfate 2g IV x 1 + K runs x 2 Add multivitamin and trace elements to TPN Adjust  sensitive SSI to Q4H and monitor CBGs Reduce LR to 45 ml/hr at 1800 when new TPN bag hung F/U AM labs, monitor for possible refeeding   12m, PharmD, BCPS Please check AMION for all [facility] contact numbers Clinical Pharmacist 03/04/2021 7:44 AM

## 2021-03-04 NOTE — Progress Notes (Signed)
Pharmacy Antibiotic Note  Travis Gallagher is a 87 y.o. male admitted on 02/22/2021 with abdominal pain and acute pancreatitis.  Pt had lap chole on 02/26/21, underwent CT scan showing perforation of proximal duodenum now s/p repair 5/4. Pharmacy has been consulted for Zosyn dosing for intra-abdominal infection/duodenal leak.   WBC 13.5, afebrile; Scr improved to 1. NG tube to remain in until 5/9, planning upper GI study 5/9.  Plan: Zosyn 3.375 gm IV Q 8 hrs (extended infusion) Monitor WBC, temp, clinical improvement, renal function  Weight: 71.9 kg (158 lb 8.2 oz)  Temp (24hrs), Avg:98.1 F (36.7 C), Min:97.6 F (36.4 C), Max:98.6 F (37 C)  Recent Labs  Lab 02/28/21 0215 03/01/21 0242 03/02/21 0205 03/03/21 0412 03/04/21 0300 03/04/21 0410  WBC 11.9* 13.5* 14.4* 20.5* 13.5*  --   CREATININE 1.45* 2.40* 1.50* 1.16  --  1.00    Estimated Creatinine Clearance: 48.7 mL/min (by C-G formula based on SCr of 1 mg/dL).    No Known Allergies  Antimicrobials this admission: Ceftriaxone: 4/27-5/4 Metronidazole IV: 4/27-5/4 Zosyn: 5/4 >>  Microbiology results: 4/26 BCx X 2: E coli, pan sensitive 4/26 UCx: <10,000 colonies/ml insignificant growth 4/28 MRSA PCR: negative 4/28 Staph aureus (nasal swab): positive 4/26 COVID, flu A, flu B: negative  Thank you for allowing pharmacy to be a part of this patient's care.  Kinnie Feil, PharmD PGY1 Acute Care Pharmacy Resident 03/04/2021 8:58 AM  Please check [email or web address] for unit specific pharmacy phone numbers.

## 2021-03-05 ENCOUNTER — Inpatient Hospital Stay (HOSPITAL_COMMUNITY): Payer: Self-pay

## 2021-03-05 LAB — MAGNESIUM: Magnesium: 1.8 mg/dL (ref 1.7–2.4)

## 2021-03-05 LAB — COMPREHENSIVE METABOLIC PANEL
ALT: 12 U/L (ref 0–44)
AST: 21 U/L (ref 15–41)
Albumin: 1.7 g/dL — ABNORMAL LOW (ref 3.5–5.0)
Alkaline Phosphatase: 106 U/L (ref 38–126)
Anion gap: 4 — ABNORMAL LOW (ref 5–15)
BUN: 23 mg/dL (ref 8–23)
CO2: 29 mmol/L (ref 22–32)
Calcium: 7.4 mg/dL — ABNORMAL LOW (ref 8.9–10.3)
Chloride: 104 mmol/L (ref 98–111)
Creatinine, Ser: 1 mg/dL (ref 0.61–1.24)
GFR, Estimated: >60 mL/min (ref >60)
Glucose, Bld: 155 mg/dL — ABNORMAL HIGH (ref 70–99)
Potassium: 3.9 mmol/L (ref 3.5–5.1)
Sodium: 137 mmol/L (ref 135–145)
Total Bilirubin: 0.5 mg/dL (ref 0.3–1.2)
Total Protein: 4.8 g/dL — ABNORMAL LOW (ref 6.5–8.1)

## 2021-03-05 LAB — CBC
HCT: 31.7 % — ABNORMAL LOW (ref 39.0–52.0)
Hemoglobin: 10.6 g/dL — ABNORMAL LOW (ref 13.0–17.0)
MCH: 29.7 pg (ref 26.0–34.0)
MCHC: 33.4 g/dL (ref 30.0–36.0)
MCV: 88.8 fL (ref 80.0–100.0)
Platelets: 235 10*3/uL (ref 150–400)
RBC: 3.57 MIL/uL — ABNORMAL LOW (ref 4.22–5.81)
RDW: 13.2 % (ref 11.5–15.5)
WBC: 15.1 10*3/uL — ABNORMAL HIGH (ref 4.0–10.5)
nRBC: 0 % (ref 0.0–0.2)

## 2021-03-05 LAB — GLUCOSE, CAPILLARY
Glucose-Capillary: 163 mg/dL — ABNORMAL HIGH (ref 70–99)
Glucose-Capillary: 149 mg/dL — ABNORMAL HIGH (ref 70–99)
Glucose-Capillary: 160 mg/dL — ABNORMAL HIGH (ref 70–99)
Glucose-Capillary: 161 mg/dL — ABNORMAL HIGH (ref 70–99)
Glucose-Capillary: 155 mg/dL — ABNORMAL HIGH (ref 70–99)

## 2021-03-05 LAB — PHOSPHORUS: Phosphorus: 2.3 mg/dL — ABNORMAL LOW (ref 2.5–4.6)

## 2021-03-05 MED ORDER — POTASSIUM PHOSPHATES 15 MMOLE/5ML IV SOLN
20.0000 mmol | Freq: Once | INTRAVENOUS | Status: AC
  Administered 2021-03-05: 20 mmol via INTRAVENOUS
  Filled 2021-03-05: qty 6.67

## 2021-03-05 MED ORDER — TRAVASOL 10 % IV SOLN
INTRAVENOUS | Status: AC
  Filled 2021-03-05: qty 940.8

## 2021-03-05 MED ORDER — ACETAMINOPHEN 325 MG PO TABS: 650.0000 mg | ORAL_TABLET | Freq: Four times a day (QID) | ORAL | Status: DC | PRN

## 2021-03-05 MED ORDER — ACETAMINOPHEN 650 MG RE SUPP
650.0000 mg | RECTAL | Status: DC | PRN
  Administered 2021-03-05 – 2021-03-07 (×3): 650 mg via RECTAL
  Filled 2021-03-05 (×3): qty 1

## 2021-03-05 MED ORDER — MAGNESIUM SULFATE 2 GM/50ML IV SOLN
2.0000 g | Freq: Once | INTRAVENOUS | Status: AC
  Administered 2021-03-05: 2 g via INTRAVENOUS
  Filled 2021-03-05: qty 50

## 2021-03-05 MED ORDER — FUROSEMIDE 10 MG/ML IJ SOLN
20.0000 mg | Freq: Once | INTRAMUSCULAR | Status: AC
  Administered 2021-03-05: 20 mg via INTRAVENOUS
  Filled 2021-03-05: qty 2

## 2021-03-05 NOTE — Progress Notes (Addendum)
4 Days Post-Op  Subjective: CC: Patient reports some right-sided abdominal pain near his drain site.  NG tube out overnight.  No current nausea. He is unsure if he is passing flatus.  No BM recorded overnight. Reports dysuria. Did not work with PT yesterday.  Spanish interpreter was used for this visit.  Family entered pond and visit.  With patient's permission I gave them an update on current plan.  Objective: Vital signs in last 24 hours: Temp:  [98 F (36.7 C)-100 F (37.8 C)] 99.7 F (37.6 C) (05/08 0747) Pulse Rate:  [94-109] 107 (05/08 0901) Resp:  [20] 20 (05/08 0901) BP: (133-175)/(61-87) 169/87 (05/08 0901) SpO2:  [90 %-95 %] 91 % (05/08 0901) Last BM Date: 03/02/21  Intake/Output from previous day: 05/07 0701 - 05/08 0700 In: 10 [I.V.:10] Out: 3965 [Urine:3950; Drains:15] Intake/Output this shift: No intake/output data recorded.  PE: General: pleasant male laying in bed in NAD, appears uncomfortable. Heart: Tachycardic in 110's Lungs: Normal rate and effort. On room air with o2 ~90 Abd: soft, flat; Midline staples visible through honeycomb bandage - intact without erythema or discharge. JP with SS output. Some tenderness on the right side of his abdomen without peritonitis.  Skin: warm and dry with no masses, lesions, or rashes Msk: No LE edema. SCD's in place  Psych: alert, interactive, appropriate  Lab Results:  Recent Labs    03/04/21 0300 03/05/21 0435  WBC 13.5* 15.1*  HGB 10.1* 10.6*  HCT 30.1* 31.7*  PLT 221 235   BMET Recent Labs    03/04/21 0410 03/05/21 0435  NA 139 137  K 3.9 3.9  CL 106 104  CO2 28 29  GLUCOSE 120* 155*  BUN 26* 23  CREATININE 1.00 1.00  CALCIUM 7.6* 7.4*   PT/INR No results for input(s): LABPROT, INR in the last 72 hours. CMP     Component Value Date/Time   NA 137 03/05/2021 0435   K 3.9 03/05/2021 0435   CL 104 03/05/2021 0435   CO2 29 03/05/2021 0435   GLUCOSE 155 (H) 03/05/2021 0435   BUN 23 03/05/2021  0435   CREATININE 1.00 03/05/2021 0435   CALCIUM 7.4 (L) 03/05/2021 0435   PROT 4.8 (L) 03/05/2021 0435   ALBUMIN 1.7 (L) 03/05/2021 0435   AST 21 03/05/2021 0435   ALT 12 03/05/2021 0435   ALKPHOS 106 03/05/2021 0435   BILITOT 0.5 03/05/2021 0435   GFRNONAA >60 03/05/2021 0435   Lipase     Component Value Date/Time   LIPASE 92 (H) 02/25/2021 0052       Studies/Results: No results found.  Anti-infectives: Anti-infectives (From admission, onward)   Start     Dose/Rate Route Frequency Ordered Stop   03/01/21 1800  piperacillin-tazobactam (ZOSYN) IVPB 3.375 g        3.375 g 12.5 mL/hr over 240 Minutes Intravenous Every 8 hours 03/01/21 1732     02/26/21 1500  metroNIDAZOLE (FLAGYL) IVPB 500 mg  Status:  Discontinued        500 mg 100 mL/hr over 60 Minutes Intravenous Every 8 hours 02/26/21 1414 03/01/21 1732   02/22/21 2000  cefTRIAXone (ROCEPHIN) 2 g in sodium chloride 0.9 % 100 mL IVPB  Status:  Discontinued        2 g 200 mL/hr over 30 Minutes Intravenous Daily at 10 pm 02/22/21 1339 03/01/21 1716   02/22/21 1600  metroNIDAZOLE (FLAGYL) IVPB 500 mg  Status:  Discontinued  500 mg 100 mL/hr over 60 Minutes Intravenous Every 8 hours 02/22/21 1339 02/26/21 1414       Assessment/Plan Elevated troponin- 32 >174 >112, EKG without ischemia changes,cards cleared for surgery E.coli, Enterobacterales bacteremia - IV abx perprimary team  Acute biliary pancreatitis, suspect resolving ascending cholangitis, gangrenous cholecystitis  s/p lap chole with Dr. Dwain Sarna on 5/1 - POD#7 - operative findings of liver cysts and cirrhosis- will need PCP/GI follow up  Duodenal perforation s/p exp lap with primary repair of duodenal perforation with [name] patch - Dr. Freida Busman 5/4 POD#4 - AFVSS, WBC 20 > 13 > 15.1. Trend  - UGI on Monday 5/9 - strict NPO, no meds PO or per tube.  - Check ua for dysuria - Repeat CXR today. CXR 5/5 w/ Low volume chest with atelectasis and  pleural effusions. Got lasix yesterday.  - PT/OT - recommending home with HH - continue JP and monitor - TPN  FEN:NPO, TPN ID: ceftriaxone 4/27>5/3, flagyl 4/26>5/4, zosyn 5/4>> Foley: Removed 5/6 VTE: SCDs, Lovenox    LOS: 11 days    Jacinto Halim , [facility] 03/05/2021, 9:52 AM Please see Amion for pager number during day hours 7:00am-4:30pm

## 2021-03-05 NOTE — Progress Notes (Signed)
PHARMACY - TOTAL PARENTERAL NUTRITION CONSULT NOTE  Indication: Prolonged ileus  Patient Measurements: Weight: 71.9 kg (158 lb 8.2 oz)   Body mass index is 24.83 kg/m.  Assessment:  60 YOM presented to [facility] with abdominal pain, found to have acute pancreatitis in setting of choledocholithiasis and transferred to [facility] on 02/22/21. Underwent ex-lap for gallstone pancreatitis on 5/1. Had contrast extravasation from proximal duodenum with bilious output and underwent ex-lap with repair of duodenal perforation with [name] patch on 5/4. Patient has inadequate nutrition for 9 days, and Pharmacy consulted to dose TPN.  Patient has been on a clear liquid diet with intermittent NPO status. Diet advanced to soft post-surgery on 5/2 but soon reverted to NPO for second surgery on 5/4. Per documentation, no recent weight changes and patient was eating well PTA.  Glucose / Insulin: no hx DM - CBGs acceptable. Utilized 10 units SSI in last 24hrs Electrolytes: K 3.9 stable (s/p K runs x 2 yesterday; goal >/=4 with ileus), Phos down to 2.3, Mag up to 1.8 (s/p Mag sulfate 2g IV x 1 yesterday; goal >/=2 with ileus), others WNL Renal: SCr down 1 - stable, BUN normalized. S/p Lasix 40mg  IV x 1 yesterday Hepatic: LFTs / Tbili / TG WNL, lipase down to 92. BL prealbumin low at 5, albumin 1.7 Intake / Output; MIVF: UOP 2.3 ml/kg/hr, NGT out, drain output documented 52mL/24hrs; net +3.7L, MIVF: LR at 45 ml/hr GI Imaging: none since TPN start GI Surgeries / Procedures: none since TPN start  Central access:  PICC placed 03/02/21 TPN start date: 03/03/21  Nutritional Goals (per RD rec on 5/5): 1900-2100 kCal, 90-105g AA, and >1.9L fluid per day  Current Nutrition:  TPN; NPO  Plan:  Continue TPN at goal rate 80 ml/hr at 1800 Electrolytes in TPN: Na 76mEq/L, K 79mEq/L, Ca 75mEq/L, increase Mag to 26mEq/L, increase Phos to 26mmol/L, Cl:Ac 1:2 Give KPhos 16m IV x 1 + Mag sulfate 2g IV x 1 Add multivitamin and trace  elements to TPN Continue sensitive SSI Q4H and adjust as needed Continue LR at 45 ml/hr for now per discussion with MD 5/8 F/U TPN labs, monitor for possible refeeding   7/8, PharmD, BCPS Please check AMION for all [facility] contact numbers Clinical Pharmacist 03/05/2021 7:30 AM

## 2021-03-05 NOTE — Progress Notes (Signed)
Pt has low grade fever at 101.  MD notified. Order for acetaminophen suppository and blood cultures placed.

## 2021-03-05 NOTE — Progress Notes (Addendum)
PROGRESS NOTE  Travis Gallagher DDU:[id number] DOB: 07-17-1931 DOA: 02/22/2021 PCP: Pcp, No   LOS: 11 days   Brief narrative:  Travis Gerold Rodriguezis a 87 y.o.malewith medical history significant forhypertension, GERDwas admitted to hospital as transfer from [facility] for management of acute pancreatitis in the setting of potential choledocholithiasis.  Patient was also found to have acute biliary pancreatitis.  GI was initially consulted but MRCP did not show choledocholithiasis.  Patient underwent laparoscopic cholecystectomy on 02/26/2021.  Cardiology was consulted for preop risk stratification.    During hospitalization, post -operatively patient continued to have abdominal pain and distention with renal failure.  He was noted to have duodenal perforation and biliary leak so underwent relaparotomy on 03/01/2021.  Patient was continued on TPN and NG tube.  General surgery following   Assessment/Plan:    Principal Problem:   Acute pancreatitis Active Problems:   Severe sepsis (HCC)   Cholangitis   Abdominal pain   Nausea   Hypertension   GERD (gastroesophageal reflux disease)   Elevated troponin  Acute biliary pancreatitis:  Patient underwent laparoscopic cholecystectomy by general surgery on 02/26/2021.  Postoperatively, patient continued to have increasing pain and distention and renal failure.  CT scan of the abdomen was performed which showed signs of biliary leak.  Patient was then taken back to the OR on 03/01/2021 and was noted to have a duodenal perforation.  He underwent laparotomy with primary repair of duodenal perforation with Phillip Heal patch by Dr. Zenia Resides on 03/01/21.    Nasogastric tube to remain on till 5/ 9 but has been accidentally removed, As per surgery ok to leave off NG.  Plan for upper GI study on 5/ 9.  Continue IV Zosyn due to duodenal perforation.  Continue TPN for nutrition.  Patient is currently strict NPO.  Duodenal perforation, biliary leak.  Status post  exploratory laparotomy with repair of duodenal perforation on 03/01/2021.    Continue IV Zosyn due to biliary leak.  Continue IV fluids for now due to NPO status.  Mild leukocytosis.  Afebrile.  T-max of 98.6 F. Will continue to monitor.  CBC Latest Ref Rng & Units 03/05/2021 03/04/2021 03/03/2021  WBC 4.0 - 10.5 K/uL 15.1(H) 13.5(H) 20.5(H)  Hemoglobin 13.0 - 17.0 g/dL 10.6(L) 10.1(L) 10.8(L)  Hematocrit 39.0 - 52.0 % 31.7(L) 30.1(L) 32.3(L)  Platelets 150 - 400 K/uL 235 221 266    Mild shortness of breath, hypoxia.   Receiving intermittent lasix.  Will need to do incentive spirometry deep breathing exercises due to basal atelectasis.  Liver cyst and cirrhosis during surgical intervention.  Will need outpatient PCP and GI follow-up.    Severe sepsis/enterobacter and E. coli bacteremia secondary to ascending cholangitis, gangrenous cholecystitis Currently on IV Zosyn secondary to duodenal perforation.  Patient initially met sepsis criteria on admission.    Sore throat from NG tube.  on  Cepacol and Chloraseptic.   Elevated troponin with LV systolic dysfunction.: EKG did not show any ischemic changes.  Possibly secondary to demand ischemia . 2D echocardiogram with left ventricular ejection fraction of 40 to 45% with diffuse hypokinesis and abnormal septal motion wall.  Cardiology followed the patient during hospitalization.  Aspirin on hold due to recent postop status.  Will need outpatient follow-up with cardiology.  Was on Coreg, has been changed to metoprolol IV.  Will need ARB at some point.  Mild Dilation of acending aorta Follow-up as outpatient.  PSVT- 2D echo with EF of 45 to 50%.  On IV metoprolol at this  time.  Was on Coreg.  Will need repeat TSH follow-up as outpatient.  Acute kidney injury Improved.  Latest creatinine of 1.0   Essential hypertension: Not on antihypertensives at home.  Was on Coreg has been changed to metoprolol IV.  IV hydralazine has been added for better  control.  Continue to monitor.   GERD: Continue IV PPI   Debility, weakness.  Physical therapy has seen the patient and recommend home health PT on discharge. Will need to try to ambulate if possible.  Hypophosphatemia.   Will replenish with IV Kphos.  Latest phosphate of 2.3  DVT prophylaxis: Place TED hose Start: 03/03/21 1742 enoxaparin (LOVENOX) injection 40 mg Start: 03/03/21 1000 Place and maintain sequential compression device Start: 02/23/21 1520 SCDs Start: 02/22/21 1225   Code Status: Full code  Family Communication:  Spoke with the patients son at bedside.  Status is: Inpatient  Remains inpatient appropriate because:Unsafe d/c plan, IV treatments appropriate due to intensity of illness or inability to take PO and Inpatient level of care appropriate due to severity of illness, status post exploratory laparotomy.  Dispo: The patient is from: Home              Anticipated d/c is to: Home with home health              Patient currently is not medically stable to d/c.   Difficult to place patient No  Consultants:  GI  Cardiology  General surgery  Procedures:  Laparoscopic cholecystectomy 02/26/2021  Exploratory laparotomy with primary repair of duodenal perforation with Phillip Heal patch on 03/01/2021  NG tube placement.  Right upper extremity PICC placement on 03/02/2021  Anti-infectives:  Marland Kitchen Ceftriaxone and metronidazole IV 4/27>5/4 . Zosyn 03/01/2021>  Anti-infectives (From admission, onward)   Start     Dose/Rate Route Frequency Ordered Stop   03/01/21 1800  piperacillin-tazobactam (ZOSYN) IVPB 3.375 g        3.375 g 12.5 mL/hr over 240 Minutes Intravenous Every 8 hours 03/01/21 1732     02/26/21 1500  metroNIDAZOLE (FLAGYL) IVPB 500 mg  Status:  Discontinued        500 mg 100 mL/hr over 60 Minutes Intravenous Every 8 hours 02/26/21 1414 03/01/21 1732   02/22/21 2000  cefTRIAXone (ROCEPHIN) 2 g in sodium chloride 0.9 % 100 mL IVPB  Status:  Discontinued         2 g 200 mL/hr over 30 Minutes Intravenous Daily at 10 pm 02/22/21 1339 03/01/21 1716   02/22/21 1600  metroNIDAZOLE (FLAGYL) IVPB 500 mg  Status:  Discontinued        500 mg 100 mL/hr over 60 Minutes Intravenous Every 8 hours 02/22/21 1339 02/26/21 1414     Subjective: Today, patient was seen and examined at bedside.  Patient's son at bedside.  Patient states that he feels little better today.  Denies any nausea vomiting but has mild abdominal pain.  Complains of generalized weakness but feels better than yesterday.   Objective: Vitals:   03/05/21 0422 03/05/21 0747  BP: (!) 158/72 (!) 160/83  Pulse: (!) 107 (!) 109  Resp: 20 20  Temp: 99.7 F (37.6 C) 99.7 F (37.6 C)  SpO2: 92% 90%    Intake/Output Summary (Last 24 hours) at 03/05/2021 0830 Last data filed at 03/05/2021 0511 Gross per 24 hour  Intake 10 ml  Output 3965 ml  Net -3955 ml   Filed Weights   03/02/21 0514 03/02/21 1534 03/03/21 0347  Weight: 70.8 kg 73.1  kg 71.9 kg   Body mass index is 24.83 kg/m.   Physical Exam: General:  Average built, not in obvious distress, off NG tube, weak deconditioned and appears ill HENT:   No scleral pallor or icterus noted. Oral mucosa is moist.  Chest: Diminished breath sounds bilaterally, CVS: S1 &S2 heard. No murmur.  Regular rate and rhythm. Abdomen: Soft, mildly distended, JP drain in place, midline staples visible, appropriately tender.  Scrotal edema present. Extremities: No cyanosis, clubbing.  Peripheral pulses are palpable.  Right upper extremity PICC line in place.  Bilateral lower extremity edema. Psych alert awake and communicative. CNS: Moves all extremities.  Neurolyse weakness. Skin: Warm and dry.  Status post laparotomy  Data Review: I have personally reviewed the following laboratory data and studies,  CBC: Recent Labs  Lab 03/01/21 0242 03/02/21 0205 03/03/21 0412 03/04/21 0300 03/05/21 0435  WBC 13.5* 14.4* 20.5* 13.5* 15.1*  NEUTROABS  --   --   18.0*  --   --   HGB 10.9* 10.9* 10.8* 10.1* 10.6*  HCT 32.9* 32.8* 32.3* 30.1* 31.7*  MCV 89.2 89.1 88.5 90.9 88.8  PLT 181 194 266 221 790   Basic Metabolic Panel: Recent Labs  Lab 03/01/21 0242 03/02/21 0205 03/03/21 0412 03/04/21 0410 03/05/21 0435  NA 136 137 140 139 137  K 3.5 4.0 4.0 3.9 3.9  CL 108 110 110 106 104  CO2 23 20* '22 28 29  ' GLUCOSE 117* 127* 104* 120* 155*  BUN 50* 44* 34* 26* 23  CREATININE 2.40* 1.50* 1.16 1.00 1.00  CALCIUM 7.1* 7.4* 8.0* 7.6* 7.4*  MG  --   --  2.0 1.7 1.8  PHOS  --   --  2.0* 2.9 2.3*   Liver Function Tests: Recent Labs  Lab 02/27/21 0048 03/01/21 0242 03/03/21 0412 03/04/21 0410 03/05/21 0435  AST 54* 59* '19 21 21  ' ALT 39 '29 16 13 12  ' ALKPHOS 150* 203* 124 124 106  BILITOT 1.0 0.8 0.8 0.8 0.5  PROT 5.5* 4.5* 4.9* 4.7* 4.8*  ALBUMIN 2.0* 1.7* 1.9* 1.8* 1.7*   No results for input(s): LIPASE, AMYLASE in the last 168 hours. No results for input(s): AMMONIA in the last 168 hours. Cardiac Enzymes: No results for input(s): CKTOTAL, CKMB, CKMBINDEX, TROPONINI in the last 168 hours. BNP (last 3 results) No results for input(s): BNP in the last 8760 hours.  ProBNP (last 3 results) No results for input(s): PROBNP in the last 8760 hours.  CBG: Recent Labs  Lab 03/04/21 1615 03/04/21 2032 03/04/21 2354 03/05/21 0425 03/05/21 0745  GLUCAP 151* 180* 165* 163* 149*   Recent Results (from the past 240 hour(s))  Surgical pcr screen     Status: Abnormal   Collection Time: 02/23/21  8:25 PM   Specimen: Nasal Mucosa; Nasal Swab  Result Value Ref Range Status   MRSA, PCR NEGATIVE NEGATIVE Final   Staphylococcus aureus POSITIVE (A) NEGATIVE Final    Comment: (NOTE) The Xpert SA Assay (FDA approved for NASAL specimens in patients 34 years of age and older), is one component of a comprehensive surveillance program. It is not intended to diagnose infection nor to guide or monitor treatment. Performed at [facility],  [address] [place], [place] [address]      Studies: No results found.   Flora Lipps, MD  [facility] 03/05/2021  If 7PM-7AM, please contact night-coverage

## 2021-03-06 ENCOUNTER — Inpatient Hospital Stay (HOSPITAL_COMMUNITY): Payer: Self-pay

## 2021-03-06 ENCOUNTER — Encounter (HOSPITAL_COMMUNITY): Payer: Self-pay | Admitting: Surgery

## 2021-03-06 LAB — COMPREHENSIVE METABOLIC PANEL
ALT: 13 U/L (ref 0–44)
AST: 19 U/L (ref 15–41)
Albumin: 1.6 g/dL — ABNORMAL LOW (ref 3.5–5.0)
Alkaline Phosphatase: 111 U/L (ref 38–126)
Anion gap: 5 (ref 5–15)
BUN: 24 mg/dL — ABNORMAL HIGH (ref 8–23)
CO2: 27 mmol/L (ref 22–32)
Calcium: 7.3 mg/dL — ABNORMAL LOW (ref 8.9–10.3)
Chloride: 102 mmol/L (ref 98–111)
Creatinine, Ser: 0.93 mg/dL (ref 0.61–1.24)
GFR, Estimated: >60 mL/min (ref >60)
Glucose, Bld: 141 mg/dL — ABNORMAL HIGH (ref 70–99)
Potassium: 3.9 mmol/L (ref 3.5–5.1)
Sodium: 134 mmol/L — ABNORMAL LOW (ref 135–145)
Total Bilirubin: 0.4 mg/dL (ref 0.3–1.2)
Total Protein: 4.8 g/dL — ABNORMAL LOW (ref 6.5–8.1)

## 2021-03-06 LAB — GLUCOSE, CAPILLARY
Glucose-Capillary: 149 mg/dL — ABNORMAL HIGH (ref 70–99)
Glucose-Capillary: 151 mg/dL — ABNORMAL HIGH (ref 70–99)
Glucose-Capillary: 153 mg/dL — ABNORMAL HIGH (ref 70–99)
Glucose-Capillary: 153 mg/dL — ABNORMAL HIGH (ref 70–99)
Glucose-Capillary: 154 mg/dL — ABNORMAL HIGH (ref 70–99)
Glucose-Capillary: 162 mg/dL — ABNORMAL HIGH (ref 70–99)
Glucose-Capillary: 164 mg/dL — ABNORMAL HIGH (ref 70–99)

## 2021-03-06 LAB — CBC
HCT: 31 % — ABNORMAL LOW (ref 39.0–52.0)
Hemoglobin: 10.2 g/dL — ABNORMAL LOW (ref 13.0–17.0)
MCH: 29.3 pg (ref 26.0–34.0)
MCHC: 32.9 g/dL (ref 30.0–36.0)
MCV: 89.1 fL (ref 80.0–100.0)
Platelets: 207 10*3/uL (ref 150–400)
RBC: 3.48 MIL/uL — ABNORMAL LOW (ref 4.22–5.81)
RDW: 13.3 % (ref 11.5–15.5)
WBC: 13.5 10*3/uL — ABNORMAL HIGH (ref 4.0–10.5)
nRBC: 0 % (ref 0.0–0.2)

## 2021-03-06 LAB — URINALYSIS, COMPLETE (UACMP) WITH MICROSCOPIC
Bacteria, UA: NONE SEEN
Bilirubin Urine: NEGATIVE
Glucose, UA: NEGATIVE mg/dL
Hgb urine dipstick: NEGATIVE
Ketones, ur: NEGATIVE mg/dL
Leukocytes,Ua: NEGATIVE
Nitrite: NEGATIVE
Protein, ur: 30 mg/dL — AB
Specific Gravity, Urine: 1.014 (ref 1.005–1.030)
pH: 9 — ABNORMAL HIGH (ref 5.0–8.0)

## 2021-03-06 LAB — DIFFERENTIAL
Abs Immature Granulocytes: 0.24 10*3/uL — ABNORMAL HIGH (ref 0.00–0.07)
Basophils Absolute: 0 10*3/uL (ref 0.0–0.1)
Basophils Relative: 0 %
Eosinophils Absolute: 0.1 10*3/uL (ref 0.0–0.5)
Eosinophils Relative: 1 %
Immature Granulocytes: 2 %
Lymphocytes Relative: 7 %
Lymphs Abs: 0.9 10*3/uL (ref 0.7–4.0)
Monocytes Absolute: 0.9 10*3/uL (ref 0.1–1.0)
Monocytes Relative: 7 %
Neutro Abs: 11.3 10*3/uL — ABNORMAL HIGH (ref 1.7–7.7)
Neutrophils Relative %: 83 %

## 2021-03-06 LAB — TRIGLYCERIDES: Triglycerides: 111 mg/dL (ref <150)

## 2021-03-06 LAB — PREALBUMIN: Prealbumin: 9 mg/dL — ABNORMAL LOW (ref 18–38)

## 2021-03-06 LAB — PHOSPHORUS: Phosphorus: 3.2 mg/dL (ref 2.5–4.6)

## 2021-03-06 LAB — MAGNESIUM: Magnesium: 2 mg/dL (ref 1.7–2.4)

## 2021-03-06 MED ORDER — FUROSEMIDE 10 MG/ML IJ SOLN
20.0000 mg | Freq: Once | INTRAMUSCULAR | Status: AC
  Administered 2021-03-06: 20 mg via INTRAVENOUS
  Filled 2021-03-06: qty 2

## 2021-03-06 MED ORDER — TRAVASOL 10 % IV SOLN
INTRAVENOUS | Status: AC
  Filled 2021-03-06: qty 940.8

## 2021-03-06 MED ORDER — IOHEXOL 300 MG/ML  SOLN
150.0000 mL | Freq: Once | INTRAMUSCULAR | Status: AC | PRN
  Administered 2021-03-06: 150 mL via ORAL

## 2021-03-06 NOTE — Progress Notes (Signed)
Physical Therapy Treatment Patient Details Name: Travis Gallagher MRN: [id number] DOB: [birth year] Today's Date: 03/06/2021    History of Present Illness 87 y.o. M admitted 02/22/2021 with gallstone pancreatitis, underwent laparoscopic cholecystectomy with gangrenous cholecystitis. Also found to have LV dysfunction with mild-moderate mitral regurgitation, PSVT, elevated troponin. On 5/4 pt found to have duodenal perforation on CT, underwent ex-lap with repair of duodenal perf with [name] patch. Significant PMH: HTN, GERD, Visiting from [place].    PT Comments    Pt received in supine, agreeable to therapy session and with good participation and fair tolerance for bed mobility and transfer training. Pt limited due to diarrhea and abdominal pain this date, reporting significant fatigue after stand pivot transfer to [facility] with +2 modA and needed Stedy lift for totalA to pivot back to bed. Reviewed supine HEP and handout given and he was able to complete a few exercises but limited due to frequent loose stools. Would be helpful to bring brief and discuss planned disposition with family next date due to limited functional progress post-op. Pt continues to benefit from PT services to progress toward functional mobility goals. Continue to recommend HHPT but will continue to assess.  Follow Up Recommendations  Home health PT;Supervision for mobility/OOB (pending progress, will continue to assess)     Equipment Recommendations  Rolling walker with 5" wheels;3in1 (PT)    Recommendations for Other Services       Precautions / Restrictions Precautions Precautions: Fall Precaution Comments: JP drain, TPN, briefs helpful (diarrhea), knee-high TED hose Restrictions Weight Bearing Restrictions: No    Mobility  Bed Mobility Overal bed mobility: Needs Assistance Bed Mobility: Rolling;Sidelying to Sit;Sit to Sidelying Rolling: Mod assist Sidelying to sit: Mod assist;+2 for safety/equipment;HOB  elevated     Sit to sidelying: Min assist;+2 for physical assistance;+2 for safety/equipment;HOB elevated General bed mobility comments: single step cues for sequencing (rolling first prior to giving instruction on sitting up) to prevent pt from activating core muscles too much, pt slow to process verbal cues needs tcs as well    Transfers Overall transfer level: Needs assistance Equipment used: Rolling walker (2 wheeled);2 person hand held assist Transfers: Sit to/from [employer] Sit to Stand: Min assist;+2 safety/equipment Stand pivot transfers: Mod assist;+2 physical assistance;+2 safety/equipment;Total assist       General transfer comment: from EOB>BSC with RW and modA +2, then from BSC>RW too painful to stand pivot back so RN got [place] and he needed dependent transfer back to bed in [place] due to pain/diarrhea.  Ambulation/Gait Ambulation/Gait assistance: +2 physical assistance;+2 safety/equipment;Mod assist Gait Distance (Feet): 4 Feet Assistive device: 2 person hand held assist Gait Pattern/deviations: Step-to pattern;Shuffle;Trunk flexed;Narrow base of support     General Gait Details: fairly unsteady, limited room next to bed so +2 HHA for safety, pt impulsive to sit due to fatigue/pain; unable to walk back from BSC>bed due to fatigue/pain so got Stedy for return   Social research officer, government Rank[employer] (Stroke Patients Only)       Balance Overall balance assessment: Needs assistance Sitting-balance support: Feet supported;Bilateral upper extremity supported Sitting balance-Leahy Scale: Poor Sitting balance - Comments: impulsive to lay down at times 2/2 fatigue/pain so needs min guard to minA for safety   Standing balance support: Bilateral upper extremity supported Standing balance-Leahy Scale: Poor Standing balance comment: reliant on external support, posterior LOB needing minA to correct with RW/HHA support  Cognition Arousal/Alertness: Awake/alert Behavior During Therapy: WFL for tasks assessed/performed;Flat affect Overall Cognitive Status: Difficult to assess                                 General Comments: Denies dizziness today but low volume speech during mobility and inconsistent with report of pain, pain seems higher with standing and RN notified; HoH and seems to have slow processing this date, needs multimodal cues.      Exercises General Exercises - Lower Extremity Short Arc Quad: AROM;Both;10 reps;Supine Other Exercises Other Exercises: ankle pumps x10 reps ea, pt given HEP handout ([email or web address]    Cdigo de acceso: [id number])    General Comments General comments (skin integrity, edema, etc.): BP 132/64 (81) supine; BP 134/61 (80) seated, not dizzy; HR to 118 bpm with standing and SpO2 poor pleth signal but WNL when signal appears      Pertinent Vitals/Pain Pain Assessment: 0-10 Pain Score: 6  Faces Pain Scale: Hurts whole lot Pain Location: abdomen, increases when standing Pain Descriptors / Indicators: Grimacing;Discomfort;Cramping Pain Intervention(s): Monitored during session;Repositioned;Patient requesting pain meds-RN notified;Limited activity within patient's tolerance    Home Living                      Prior Function            PT Goals (current goals can now be found in the care plan section) Acute Rehab PT Goals Patient Stated Goal: get better PT Goal Formulation: With patient Time For Goal Achievement: 03/16/21 Progress towards PT goals: Not progressing toward goals - comment (abdominal pain and diarrhea limiting this date)    Frequency    Min 3X/week      PT Plan Current plan remains appropriate    Co-evaluation              AM-PAC PT "6 Clicks" Mobility   Outcome Measure  Help needed turning from your back to your side while in a flat bed without using bedrails?: A  Lot Help needed moving from lying on your back to sitting on the side of a flat bed without using bedrails?: A Lot Help needed moving to and from a bed to a chair (including a wheelchair)?: A Lot Help needed standing up from a chair using your arms (e.g., wheelchair or bedside chair)?: A Lot Help needed to walk in hospital room?: A Lot Help needed climbing 3-5 steps with a railing? : Total 6 Click Score: 11    End of Session Equipment Utilized During Treatment: Gait belt;Other (comment) (TED hose) Activity Tolerance: Patient limited by fatigue;Patient limited by pain;Other (comment) (abdominal cramping with exertion, RN notified) Patient left: in bed;with call bell/phone within reach;with bed alarm set;with family/visitor present;Other (comment);with nursing/sitter in room (Heels floated, son in room, DIY scrotal sling (using pillowcase) to elevate due to edema) Nurse Communication: Mobility status;Other (comment) (abdominal cramps) PT Visit Diagnosis: Unsteadiness on feet (R26.81);Muscle weakness (generalized) (M62.81);Difficulty in walking, not elsewhere classified (R26.2);Pain Pain - part of body:  (abdomen)     Time: 6195-0932 PT Time Calculation (min) (ACUTE ONLY): 48 min  Charges:  $Therapeutic Exercise: 8-22 mins $Therapeutic Activity: 23-37 mins                     Nimsi Males P., PTA Acute Rehabilitation Services Pager: [phone] Office: [phone]    [place] 03/06/2021, 2:39 PM

## 2021-03-06 NOTE — Progress Notes (Signed)
Called [facility] PA Maczis for pt/family concerns of possibly more distention in abdomin and pain. Order for xray. Will let pt/family know. Thomas Hoff, RN

## 2021-03-06 NOTE — Progress Notes (Signed)
PHARMACY - TOTAL PARENTERAL NUTRITION CONSULT NOTE  Indication: Prolonged ileus  Patient Measurements: Weight: 71.9 kg (158 lb 8.2 oz)   Body mass index is 24.83 kg/m.  Assessment:  87 YOM presented to [facility] with abdominal pain, found to have acute pancreatitis in setting of choledocholithiasis and transferred to [facility] on 02/22/21. Underwent ex-lap for gallstone pancreatitis on 5/1. Had contrast extravasation from proximal duodenum with bilious output and underwent ex-lap with repair of duodenal perforation with [name] patch on 5/4. Patient has inadequate nutrition for 9 days, and Pharmacy consulted to dose TPN.  Patient has been on a clear liquid diet with intermittent NPO status. Diet advanced to soft post-surgery on 5/2 but soon reverted to NPO for second surgery on 5/4. Per documentation, no recent weight changes and patient was eating well PTA.  Glucose / Insulin: no hx DM - CBGs < 180. Utilized 11 units SSI in last 24hrs Electrolytes: low Na, K 3.9 and Phos 3.2 s/p Lasix 20mg  IV and KPhos 20 mmol yesterday (goal K >/= 4 with ileus), Mag up to 2 post 2gm yesterday, others WNL Renal: SCr down < 1, BUN 24 Hepatic: LFTs / Tbili / TG WNL, lipase down to 92. BL prealbumin improved to 9, albumin 1.6 Intake / Output; MIVF: UOP 0.2 ml/kg/hr, drain output documented 31mL; net +3.2L GI Imaging: none since TPN start GI Surgeries / Procedures: none since TPN start  Central access:  PICC placed 03/02/21 TPN start date: 03/03/21  Nutritional Goals (per RD rec on 5/5): 1900-2100 kCal, 90-105g AA, and >1.9L fluid per day  Current Nutrition:  TPN  Plan:  Continue TPN at goal rate 80 ml/hr to provide 94g AA and 1951 kCal, meeting 100% of needs Electrolytes in TPN: increase Na 16mEq/L, increase K slightly to 52mEq/L, Ca 23mEq/L, increase Mag to 68mEq/L on 5/8, increase Phos to 1mmol/L on 5/8, Cl:Ac 1:2 Add multivitamin and trace elements to TPN Continue sensitive SSI Q4H IVF to [facility] per discussion with  MD Standard TPN labs on Mon and Thurs  Travis Salinger D. [birth year]-05-1980, PharmD, BCPS, BCCCP 03/06/2021, 9:17 AM

## 2021-03-06 NOTE — Progress Notes (Signed)
Family at bedside.  Educated on importance of using incentive spirometer.  Pt did two breaths during morning assessment with interpreter.  Pt did 10 breaths with family in room. Graciela called to help with interpretation to complete admission information. Pt resting with call bell within reach.  Will continue to monitor.

## 2021-03-06 NOTE — Progress Notes (Signed)
5 Days Post-Op  Subjective: CC: Spanish interpreter used. Patient with temperature to 101 yesterday. Off o2. HR 101-115. CXR yesterday w/ possible atelectasis vs PNA in LLL. He complains of pain just left of his incision and in the RUQ. No nausea. Passing flatus. BM yesterday. Had dysuria yesterday but UA w/o signs of UTI. WBC down at 13.5  Objective: Vital signs in last 24 hours: Temp:  [99.2 F (37.3 C)-101 F (38.3 C)] 99.5 F (37.5 C) (05/09 0700) Pulse Rate:  [100-115] 101 (05/09 0700) Resp:  [20-31] 20 (05/09 0700) BP: (131-174)/(59-89) 161/77 (05/09 0700) SpO2:  [91 %-98 %] 93 % (05/09 0700) Last BM Date: 03/05/21  Intake/Output from previous day: 05/08 0701 - 05/09 0700 In: 10 [I.V.:10] Out: 400 [Urine:400] Intake/Output this shift: Total I/O In: -  Out: 1000 [Urine:1000]  PE: General: pleasant male laying in bed in NAD, appears uncomfortable. Heart:Tachycardic in 110's Lungs:Normal rate and effort. Appears cta b/l. On room air with o2 ~93 Abd: Soft, mild distension;midline staples visible through honeycomb bandage - intact without erythema or discharge. JP with SS output. Some tenderness on the right side of his abdomen that is slightly increased from yesterday but without peritonitis.  Skin: warm and dry with no masses, lesions, or rashes Msk: No LE edema. SCD's in place  Psych:alert, interactive, appropriate  Lab Results:  Recent Labs    03/05/21 0435 03/06/21 0300  WBC 15.1* 13.5*  HGB 10.6* 10.2*  HCT 31.7* 31.0*  PLT 235 207   BMET Recent Labs    03/05/21 0435 03/06/21 0300  NA 137 134*  K 3.9 3.9  CL 104 102  CO2 29 27  GLUCOSE 155* 141*  BUN 23 24*  CREATININE 1.00 0.93  CALCIUM 7.4* 7.3*   PT/INR No results for input(s): LABPROT, INR in the last 72 hours. CMP     Component Value Date/Time   NA 134 (L) 03/06/2021 0300   K 3.9 03/06/2021 0300   CL 102 03/06/2021 0300   CO2 27 03/06/2021 0300   GLUCOSE 141 (H) 03/06/2021 0300    BUN 24 (H) 03/06/2021 0300   CREATININE 0.93 03/06/2021 0300   CALCIUM 7.3 (L) 03/06/2021 0300   PROT 4.8 (L) 03/06/2021 0300   ALBUMIN 1.6 (L) 03/06/2021 0300   AST 19 03/06/2021 0300   ALT 13 03/06/2021 0300   ALKPHOS 111 03/06/2021 0300   BILITOT 0.4 03/06/2021 0300   GFRNONAA >60 03/06/2021 0300   Lipase     Component Value Date/Time   LIPASE 92 (H) 02/25/2021 0052       Studies/Results: DG CHEST PORT 1 VIEW  Result Date: 03/05/2021 CLINICAL DATA:  Shortness of breath. Status post exploratory laparotomy. EXAM: PORTABLE CHEST 1 VIEW COMPARISON:  03/02/2021 FINDINGS: Poor inspiration. Normal sized heart. Small left pleural effusion without significant change. Persistent dense airspace opacity in the left lower lobe. Otherwise, decreased airspace opacity at both lung bases. Thoracic spine degenerative changes. IMPRESSION: 1. Improved bibasilar atelectasis. 2. Stable dense pneumonia or atelectasis in the left lower lobe. Electronically Signed   By: Beckie Salts M.D.   On: 03/05/2021 12:23    Anti-infectives: Anti-infectives (From admission, onward)   Start     Dose/Rate Route Frequency Ordered Stop   03/01/21 1800  piperacillin-tazobactam (ZOSYN) IVPB 3.375 g        3.375 g 12.5 mL/hr over 240 Minutes Intravenous Every 8 hours 03/01/21 1732     02/26/21 1500  metroNIDAZOLE (FLAGYL) IVPB 500 mg  Status:  Discontinued        500 mg 100 mL/hr over 60 Minutes Intravenous Every 8 hours 02/26/21 1414 03/01/21 1732   02/22/21 2000  cefTRIAXone (ROCEPHIN) 2 g in sodium chloride 0.9 % 100 mL IVPB  Status:  Discontinued        2 g 200 mL/hr over 30 Minutes Intravenous Daily at 10 pm 02/22/21 1339 03/01/21 1716   02/22/21 1600  metroNIDAZOLE (FLAGYL) IVPB 500 mg  Status:  Discontinued        500 mg 100 mL/hr over 60 Minutes Intravenous Every 8 hours 02/22/21 1339 02/26/21 1414       Assessment/Plan Elevated troponin- 32 >174 >112, EKG without ischemia changes,cards cleared  for surgery E.coli, Enterobacterales bacteremia - IV abx perprimary team.   Acute biliary pancreatitis, suspect resolving ascending cholangitis, gangrenous cholecystitis  s/p lap chole with Dr. Dwain Sarna on 5/1 - POD#8 - operative findings of liver cysts and cirrhosis- will need PCP/GI follow up  Duodenal perforation s/p exp lap with primary repair of duodenal perforation with [name] patch - Dr. Freida Busman 5/4 POD#5 - Febrile to 101 overnight. WBC20 > 13 > 15.1>13.5. Trend  - UGI pending - Strict NPO, no meds PO - PT/OT - recommending home with HH - Continue JP and monitor - TPN  FEN:NPO, TPN ID: ceftriaxone 4/27>5/3, flagyl 4/26>5/4, zosyn 5/4>> Febrile overnight to 101. Repeat blood cx's pending. He is tachy w/ HR 101-115. CXR yesterday w/ possible atelectasis vs PNA in LLL. He is now off o2 and denies sob. No LE edema. UA w/o signs of UTI. WBC down at 13.5. Needs close monitoring. May need additional workup. Continue IV abx.  Foley: Removed 5/6., Voiding.  VTE: SCDs, Lovenox    LOS: 12 days    Jacinto Halim , [facility] 03/06/2021, 9:42 AM Please see Amion for pager number during day hours 7:00am-4:30pm

## 2021-03-06 NOTE — Progress Notes (Addendum)
PROGRESS NOTE  Travis Gallagher UXL:[id number] DOB: September 05, 1931 DOA: 02/22/2021 PCP: Pcp, No   LOS: 12 days   Brief narrative:  Travis Gallagher is a 87 y.o. male with medical history significant for hypertension, GERD was admitted to hospital as transfer from [facility] for management of acute pancreatitis in the setting of potential choledocholithiasis.  Patient was also found to have acute biliary pancreatitis.  GI was initially consulted but MRCP did not show choledocholithiasis.  Patient underwent laparoscopic cholecystectomy on 02/26/2021.  Cardiology was consulted for preop risk stratification.    During hospitalization, post -operatively patient continued to have abdominal pain and distention with renal failure.  He was noted to have duodenal perforation and biliary leak so underwent relaparotomy on 03/01/2021.  Patient was continued on TPN and NG tube.  General surgery following   Assessment/Plan:    Principal Problem:   Acute pancreatitis Active Problems:   Severe sepsis (HCC)   Cholangitis   Abdominal pain   Nausea   Hypertension   GERD (gastroesophageal reflux disease)   Elevated troponin  Acute biliary pancreatitis:    Patient underwent laparoscopic cholecystectomy by general surgery on 02/26/2021.  Postoperatively, patient continued to have increasing pain and distention and renal failure.  CT scan of the abdomen was performed which showed signs of biliary leak.  Patient was then taken back to the OR on 03/01/2021 and was noted to have a duodenal perforation.  He underwent laparotomy with primary repair of duodenal perforation with Travis Gallagher patch by Dr. Zenia Gallagher on 03/01/21.   Plan for upper GI study on 5/ 9.  Continue IV Zosyn due to duodenal perforation.  Continue TPN for nutrition.  Patient is currently started on clears.  1 episode of fever yesterday.  Unsure of exact etiology.  Blood cultures have been sent.  Chest x-ray showed diminished lung volumes.  Patient was  advised incentive spirometry. Latest temperature of 99.37F.  WBC has been trending down.  Duodenal perforation, biliary leak.  Status post exploratory laparotomy with repair of duodenal perforation on 03/01/2021.    Continue IV Zosyn due to biliary leak. Patient has been started on clears.  Mild leukocytosis.  Afebrile.  T-max of 98.6 F. Will continue to monitor.  CBC Latest Ref Rng & Units 03/06/2021 03/05/2021 03/04/2021  WBC 4.0 - 10.5 K/uL 13.5(H) 15.1(H) 13.5(H)  Hemoglobin 13.0 - 17.0 g/dL 10.2(L) 10.6(L) 10.1(L)  Hematocrit 39.0 - 52.0 % 31.0(L) 31.7(L) 30.1(L)  Platelets 150 - 400 K/uL 207 235 221    Mild shortness of breath, hypoxia.   Improved.  Continue incentive spirometry, deep breathing exercises due to basal atelectasis.  Liver cyst and cirrhosis during surgical intervention.  Will need outpatient PCP and GI follow-up.    Severe sepsis/enterobacter and E. coli bacteremia secondary to ascending cholangitis, gangrenous cholecystitis Currently on IV Zosyn, secondary to duodenal perforation.  Patient initially met sepsis criteria on admission.      Elevated troponin with LV systolic dysfunction, chronic combined congestive heart failure. EKG did not show any ischemic changes.  Possibly secondary to demand ischemia . 2D echocardiogram with left ventricular ejection fraction of 40 to 45% with diffuse hypokinesis and abnormal septal motion wall.  Cardiology followed the patient during hospitalization.  Aspirin on hold due to recent postop status.  Will need outpatient follow-up with cardiology.  Was on Coreg, has been changed to metoprolol IV.  Will need ARB at some point.   Mild Dilation of acending aorta Follow-up as outpatient.    PSVT- 2D  echo with EF of 45 to 50%.  On IV metoprolol at this time.  Was on Coreg.  Will need repeat TSH follow-up as outpatient.   Acute kidney injury Improved.  Latest creatinine of 1.0   Essential hypertension:  Not on antihypertensives at home.  Was  on Coreg has been changed to metoprolol IV.  IV hydralazine has been added for better control.  Continue to monitor.     GERD: Continue IV PPI   Debility, weakness.  Physical therapy has seen the patient and recommend home health PT on discharge.  Encouraged ambulation.    Hypophosphatemia.   Resolved after replacement   DVT prophylaxis: Place TED hose Start: 03/03/21 1742 enoxaparin (LOVENOX) injection 40 mg Start: 03/03/21 1000 Place and maintain sequential compression device Start: 02/23/21 1520 SCDs Start: 02/22/21 1225   Code Status: Full code  Family Communication:  I spoke with the patients son at bedside.  Status is: Inpatient  Remains inpatient appropriate because:Unsafe d/c plan, IV treatments appropriate due to intensity of illness or inability to take PO and Inpatient level of care appropriate due to severity of illness, status post exploratory laparotomy.  Dispo: The patient is from: Home              Anticipated d/c is to: Home with home health              Patient currently is not medically stable to d/c.   Difficult to place patient No  Consultants: GI Cardiology General surgery  Procedures: Laparoscopic cholecystectomy 02/26/2021 Exploratory laparotomy with primary repair of duodenal perforation with Travis Gallagher patch on 03/01/2021 NG tube placement. Right upper extremity PICC placement on 03/02/2021  Anti-infectives:  Ceftriaxone and metronidazole IV 4/27>5/4 Zosyn 03/01/2021>  Anti-infectives (From admission, onward)    Start     Dose/Rate Route Frequency Ordered Stop   03/01/21 1800  piperacillin-tazobactam (ZOSYN) IVPB 3.375 g        3.375 g 12.5 mL/hr over 240 Minutes Intravenous Every 8 hours 03/01/21 1732     02/26/21 1500  metroNIDAZOLE (FLAGYL) IVPB 500 mg  Status:  Discontinued        500 mg 100 mL/hr over 60 Minutes Intravenous Every 8 hours 02/26/21 1414 03/01/21 1732   02/22/21 2000  cefTRIAXone (ROCEPHIN) 2 g in sodium chloride 0.9 % 100 mL IVPB   Status:  Discontinued        2 g 200 mL/hr over 30 Minutes Intravenous Daily at 10 pm 02/22/21 1339 03/01/21 1716   02/22/21 1600  metroNIDAZOLE (FLAGYL) IVPB 500 mg  Status:  Discontinued        500 mg 100 mL/hr over 60 Minutes Intravenous Every 8 hours 02/22/21 1339 02/26/21 1414      Subjective: Today, patient was seen and examined at bedside.  Patient complains of mild abdominal discomfort.  No nausea, vomiting.  Feels better with breathing.  Spoke with the patient's son at bedside.   Objective: Vitals:   03/06/21 0700 03/06/21 1157  BP: (!) 161/77 133/60  Pulse: (!) 101 95  Resp: 20 (!) 30  Temp: 99.5 F (37.5 C) 99.6 F (37.6 C)  SpO2: 93% 100%    Intake/Output Summary (Last 24 hours) at 03/06/2021 1253 Last data filed at 03/06/2021 0809 Gross per 24 hour  Intake 10 ml  Output 1000 ml  Net -990 ml   Filed Weights   03/02/21 0514 03/02/21 1534 03/03/21 0347  Weight: 70.8 kg 73.1 kg 71.9 kg   Body mass index is  24.83 kg/m.   Physical Exam: General:  Average built, not in obvious distress, weak deconditioned and ill appearing HENT:   No scleral pallor or icterus noted. Oral mucosa is moist.  Chest:   Diminished breath sounds bilaterally. No crackles or wheezes.  CVS: S1 &S2 heard. No murmur.  Regular rate and rhythm. Abdomen: Soft, nontender, mildly distended abdomen.  Bowel sounds are heard.  JP drain in place, midline staples visible, scrotal edema present, Extremities: No cyanosis, clubbing or edema.  Peripheral pulses are palpable.  Right upper extremity PICC line in place.  Bilateral lower extremity edema + Psych: Alert, awake and oriented, normal mood CNS: Moves all extremities. Skin: Warm and dry.  Status post laparotomy.  Data Review: I have personally reviewed the following laboratory data and studies,  CBC: Recent Labs  Lab 03/02/21 0205 03/03/21 0412 03/04/21 0300 03/05/21 0435 03/06/21 0300  WBC 14.4* 20.5* 13.5* 15.1* 13.5*  NEUTROABS  --   18.0*  --   --  11.3*  HGB 10.9* 10.8* 10.1* 10.6* 10.2*  HCT 32.8* 32.3* 30.1* 31.7* 31.0*  MCV 89.1 88.5 90.9 88.8 89.1  PLT 194 266 221 235 400   Basic Metabolic Panel: Recent Labs  Lab 03/02/21 0205 03/03/21 0412 03/04/21 0410 03/05/21 0435 03/06/21 0300  NA 137 140 139 137 134*  K 4.0 4.0 3.9 3.9 3.9  CL 110 110 106 104 102  CO2 20* '22 28 29 27  ' GLUCOSE 127* 104* 120* 155* 141*  BUN 44* 34* 26* 23 24*  CREATININE 1.50* 1.16 1.00 1.00 0.93  CALCIUM 7.4* 8.0* 7.6* 7.4* 7.3*  MG  --  2.0 1.7 1.8 2.0  PHOS  --  2.0* 2.9 2.3* 3.2   Liver Function Tests: Recent Labs  Lab 03/01/21 0242 03/03/21 0412 03/04/21 0410 03/05/21 0435 03/06/21 0300  AST 59* '19 21 21 19  ' ALT '29 16 13 12 13  ' ALKPHOS 203* 124 124 106 111  BILITOT 0.8 0.8 0.8 0.5 0.4  PROT 4.5* 4.9* 4.7* 4.8* 4.8*  ALBUMIN 1.7* 1.9* 1.8* 1.7* 1.6*   No results for input(s): LIPASE, AMYLASE in the last 168 hours. No results for input(s): AMMONIA in the last 168 hours. Cardiac Enzymes: No results for input(s): CKTOTAL, CKMB, CKMBINDEX, TROPONINI in the last 168 hours. BNP (last 3 results) No results for input(s): BNP in the last 8760 hours.  ProBNP (last 3 results) No results for input(s): PROBNP in the last 8760 hours.  CBG: Recent Labs  Lab 03/05/21 1946 03/06/21 0002 03/06/21 0350 03/06/21 0801 03/06/21 1229  GLUCAP 155* 151* 153* 164* 149*   Recent Results (from the past 240 hour(s))  Culture, blood (Routine X 2) w Reflex to ID Panel     Status: None (Preliminary result)   Collection Time: 03/05/21  5:30 PM   Specimen: BLOOD  Result Value Ref Range Status   Specimen Description BLOOD LEFT ANTECUBITAL  Final   Special Requests   Final    BOTTLES DRAWN AEROBIC ONLY Blood Culture adequate volume   Culture   Final    NO GROWTH < 12 HOURS Performed at [facility], [address], [place], [place] [address]    Report Status PENDING  Incomplete  Culture, blood (Routine X 2) w Reflex to ID  Panel     Status: None (Preliminary result)   Collection Time: 03/05/21  5:36 PM   Specimen: BLOOD LEFT WRIST  Result Value Ref Range Status   Specimen Description BLOOD LEFT WRIST  Final   Special Requests   Final    BOTTLES DRAWN AEROBIC ONLY Blood Culture adequate volume   Culture   Final    NO GROWTH < 12 HOURS Performed at [facility], [address], [place], [place] [address]    Report Status PENDING  Incomplete     Studies: DG CHEST PORT 1 VIEW  Result Date: 03/05/2021 CLINICAL DATA:  Shortness of breath. Status post exploratory laparotomy. EXAM: PORTABLE CHEST 1 VIEW COMPARISON:  03/02/2021 FINDINGS: Poor inspiration. Normal sized heart. Small left pleural effusion without significant change. Persistent dense airspace opacity in the left lower lobe. Otherwise, decreased airspace opacity at both lung bases. Thoracic spine degenerative changes. IMPRESSION: 1. Improved bibasilar atelectasis. 2. Stable dense pneumonia or atelectasis in the left lower lobe. Electronically Signed   By: Claudie Revering M.D.   On: 03/05/2021 12:23   DG UGI W SINGLE CM (SOL OR THIN BA)  Result Date: 03/06/2021 CLINICAL DATA:  Status post duodenal perforation repair. EXAM: WATER SOLUBLE UPPER GI SERIES TECHNIQUE: Single-column upper GI series was performed using water soluble contrast. CONTRAST:  OMNIPAQUE IOHEXOL 300 MG/ML  SOLN COMPARISON:  CT 03/01/2021. FLUOROSCOPY TIME:  Fluoroscopy Time:  3 minutes and 24 seconds Radiation Exposure Index (if provided by the fluoroscopic device): 126.6 mGy Number of Acquired Spot Images: FINDINGS: Pre-procedure KUB shows a nonspecific gas pattern with surgical drain in the right upper quadrant. Challenging study due to patient immobility and pain with motion. We did elevate the head of the fluoro table as much as possible and put the patient in a right-side-down position relative to the fluoro table to promote gastric emptying. Stomach is nondistended. Water-soluble  contrast did migrate through the pylorus into the duodenal bulb, through the duodenum and into the proximal jejunum. There is no evidence for contrast extravasation from the duodenum. No evidence for contrast entering the right upper quadrant surgical drain. IMPRESSION: No demonstrable contrast extravasation from the duodenum to suggest leak. Electronically Signed   By: Misty Stanley M.D.   On: 03/06/2021 10:22     Flora Lipps, MD  [facility] 03/06/2021  If 7PM-7AM, please contact night-coverage

## 2021-03-07 ENCOUNTER — Inpatient Hospital Stay (HOSPITAL_COMMUNITY): Payer: Self-pay

## 2021-03-07 DIAGNOSIS — M7989 Other specified soft tissue disorders: Secondary | ICD-10-CM

## 2021-03-07 LAB — COMPREHENSIVE METABOLIC PANEL
ALT: 18 U/L (ref 0–44)
AST: 29 U/L (ref 15–41)
Albumin: 1.7 g/dL — ABNORMAL LOW (ref 3.5–5.0)
Alkaline Phosphatase: 100 U/L (ref 38–126)
Anion gap: 4 — ABNORMAL LOW (ref 5–15)
BUN: 26 mg/dL — ABNORMAL HIGH (ref 8–23)
CO2: 28 mmol/L (ref 22–32)
Calcium: 7.5 mg/dL — ABNORMAL LOW (ref 8.9–10.3)
Chloride: 105 mmol/L (ref 98–111)
Creatinine, Ser: 0.98 mg/dL (ref 0.61–1.24)
GFR, Estimated: >60 mL/min (ref >60)
Glucose, Bld: 136 mg/dL — ABNORMAL HIGH (ref 70–99)
Potassium: 3.9 mmol/L (ref 3.5–5.1)
Sodium: 137 mmol/L (ref 135–145)
Total Bilirubin: 0.2 mg/dL — ABNORMAL LOW (ref 0.3–1.2)
Total Protein: 5.1 g/dL — ABNORMAL LOW (ref 6.5–8.1)

## 2021-03-07 LAB — CBC
HCT: 29.4 % — ABNORMAL LOW (ref 39.0–52.0)
Hemoglobin: 9.5 g/dL — ABNORMAL LOW (ref 13.0–17.0)
MCH: 30.3 pg (ref 26.0–34.0)
MCHC: 32.3 g/dL (ref 30.0–36.0)
MCV: 93.6 fL (ref 80.0–100.0)
Platelets: 215 10*3/uL (ref 150–400)
RBC: 3.14 MIL/uL — ABNORMAL LOW (ref 4.22–5.81)
RDW: 13.7 % (ref 11.5–15.5)
WBC: 13.2 10*3/uL — ABNORMAL HIGH (ref 4.0–10.5)
nRBC: 0 % (ref 0.0–0.2)

## 2021-03-07 LAB — GLUCOSE, CAPILLARY
Glucose-Capillary: 142 mg/dL — ABNORMAL HIGH (ref 70–99)
Glucose-Capillary: 146 mg/dL — ABNORMAL HIGH (ref 70–99)
Glucose-Capillary: 149 mg/dL — ABNORMAL HIGH (ref 70–99)
Glucose-Capillary: 151 mg/dL — ABNORMAL HIGH (ref 70–99)
Glucose-Capillary: 151 mg/dL — ABNORMAL HIGH (ref 70–99)
Glucose-Capillary: 166 mg/dL — ABNORMAL HIGH (ref 70–99)

## 2021-03-07 LAB — PHOSPHORUS: Phosphorus: 3 mg/dL (ref 2.5–4.6)

## 2021-03-07 LAB — MAGNESIUM: Magnesium: 2.1 mg/dL (ref 1.7–2.4)

## 2021-03-07 MED ORDER — IOHEXOL 350 MG/ML SOLN
100.0000 mL | Freq: Once | INTRAVENOUS | Status: AC | PRN
  Administered 2021-03-07: 100 mL via INTRAVENOUS

## 2021-03-07 MED ORDER — TRAVASOL 10 % IV SOLN
INTRAVENOUS | Status: AC
  Filled 2021-03-07: qty 940.8

## 2021-03-07 MED ORDER — ZOLPIDEM TARTRATE 5 MG PO TABS
5.0000 mg | ORAL_TABLET | Freq: Every evening | ORAL | Status: DC | PRN
  Administered 2021-03-11: 5 mg via ORAL
  Filled 2021-03-07 (×4): qty 1

## 2021-03-07 NOTE — Progress Notes (Signed)
6 Days Post-Op  Subjective: CC: Spanish interpreter used.  Patient reports burning sensation to the mid upper and right abdomen that is constant. He drank some broth yesterday without n/v. Feels more distended this am. Having multiple liquid bm's. Nurse reports LE's and scrotum have been swollen. He received lasix yesterday and was able to be weaned off o2. Sats as low as 76 yesterday. Intermittently w/ tachypnea. Denies cp. Reports sob. Nurse reports he has been coughing. Has been using IS. Voiding.   Objective: Vital signs in last 24 hours: Temp:  [97.5 F (36.4 C)-100.7 F (38.2 C)] 99.9 F (37.7 C) (05/10 0740) Pulse Rate:  [94-110] 97 (05/10 0740) Resp:  [20-30] 25 (05/10 0740) BP: (132-158)/(58-76) 158/73 (05/10 0740) SpO2:  [92 %-100 %] 95 % (05/10 0740) Weight:  [72.7 kg] 72.7 kg (05/10 0420) Last BM Date: 03/06/21  Intake/Output from previous day: 05/09 0701 - 05/10 0700 In: 50 [IV Piggyback:50] Out: 3525 [Urine:3525] Intake/Output this shift: No intake/output data recorded.  PE: General: pleasant male laying in bed in NAD, appears uncomfortable. Heart:Tachycardic in 110's Lungs:Tachypnea. Distant breath sounds at the bases. On room air with o2 ~93 Abd: Soft, more distended with what appears to be anasarca of the abdomen, JP with bloody SS output. More tender on the right side of his abdomen that is slightly increased from yesterday but without peritonitis.Midline wound with staples in place. The top of the wound appears to have surrounding cellulitis and there is thick dark red fluid draining. I removed the top 8 staples and evacuated a hematoma. WTD dressing applied.  GU: Chaperone RN present. Scrotal edema present Skin: warm and dry with no masses, lesions, or rashes Msk: Trace LE pitting edema noted. TED hose and SCD's in place. Calf tenderness b/l reported by the patient.  Psych:alert, interactive, appropriate  Lab Results:  Recent Labs    03/06/21 0300  03/07/21 0740  WBC 13.5* 13.2*  HGB 10.2* 9.5*  HCT 31.0* 29.4*  PLT 207 215   BMET Recent Labs    03/05/21 0435 03/06/21 0300  NA 137 134*  K 3.9 3.9  CL 104 102  CO2 29 27  GLUCOSE 155* 141*  BUN 23 24*  CREATININE 1.00 0.93  CALCIUM 7.4* 7.3*   PT/INR No results for input(s): LABPROT, INR in the last 72 hours. CMP     Component Value Date/Time   NA 134 (L) 03/06/2021 0300   K 3.9 03/06/2021 0300   CL 102 03/06/2021 0300   CO2 27 03/06/2021 0300   GLUCOSE 141 (H) 03/06/2021 0300   BUN 24 (H) 03/06/2021 0300   CREATININE 0.93 03/06/2021 0300   CALCIUM 7.3 (L) 03/06/2021 0300   PROT 4.8 (L) 03/06/2021 0300   ALBUMIN 1.6 (L) 03/06/2021 0300   AST 19 03/06/2021 0300   ALT 13 03/06/2021 0300   ALKPHOS 111 03/06/2021 0300   BILITOT 0.4 03/06/2021 0300   GFRNONAA >60 03/06/2021 0300   Lipase     Component Value Date/Time   LIPASE 92 (H) 02/25/2021 0052       Studies/Results: DG CHEST PORT 1 VIEW  Result Date: 03/05/2021 CLINICAL DATA:  Shortness of breath. Status post exploratory laparotomy. EXAM: PORTABLE CHEST 1 VIEW COMPARISON:  03/02/2021 FINDINGS: Poor inspiration. Normal sized heart. Small left pleural effusion without significant change. Persistent dense airspace opacity in the left lower lobe. Otherwise, decreased airspace opacity at both lung bases. Thoracic spine degenerative changes. IMPRESSION: 1. Improved bibasilar atelectasis. 2. Stable dense pneumonia  or atelectasis in the left lower lobe. Electronically Signed   By: Beckie Salts M.D.   On: 03/05/2021 12:23   DG Abd Portable 1V  Result Date: 03/06/2021 CLINICAL DATA:  Abdominal distension EXAM: PORTABLE ABDOMEN - 1 VIEW COMPARISON:  Films from recent upper GI FINDINGS: Scattered large and small bowel gas is noted. Previously administered contrast from upper GI now lies within the colon. The appendix is within normal limits. Postsurgical changes with a right upper quadrant drain are noted. No  obstructive changes are seen. No definitive free air is identified. IMPRESSION: Previously administered contrast now lies throughout the colon. No obstructive changes are seen. Postsurgical changes with right upper quadrant drain are noted. No acute abnormality is seen. Electronically Signed   By: Alcide Clever M.D.   On: 03/06/2021 16:55   DG UGI W SINGLE CM (SOL OR THIN BA)  Result Date: 03/06/2021 CLINICAL DATA:  Status post duodenal perforation repair. EXAM: WATER SOLUBLE UPPER GI SERIES TECHNIQUE: Single-column upper GI series was performed using water soluble contrast. CONTRAST:  OMNIPAQUE IOHEXOL 300 MG/ML  SOLN COMPARISON:  CT 03/01/2021. FLUOROSCOPY TIME:  Fluoroscopy Time:  3 minutes and 24 seconds Radiation Exposure Index (if provided by the fluoroscopic device): 126.6 mGy Number of Acquired Spot Images: FINDINGS: Pre-procedure KUB shows a nonspecific gas pattern with surgical drain in the right upper quadrant. Challenging study due to patient immobility and pain with motion. We did elevate the head of the fluoro table as much as possible and put the patient in a right-side-down position relative to the fluoro table to promote gastric emptying. Stomach is nondistended. Water-soluble contrast did migrate through the pylorus into the duodenal bulb, through the duodenum and into the proximal jejunum. There is no evidence for contrast extravasation from the duodenum. No evidence for contrast entering the right upper quadrant surgical drain. IMPRESSION: No demonstrable contrast extravasation from the duodenum to suggest leak. Electronically Signed   By: Kennith Center M.D.   On: 03/06/2021 10:22    Anti-infectives: Anti-infectives (From admission, onward)   Start     Dose/Rate Route Frequency Ordered Stop   03/01/21 1800  piperacillin-tazobactam (ZOSYN) IVPB 3.375 g        3.375 g 12.5 mL/hr over 240 Minutes Intravenous Every 8 hours 03/01/21 1732     02/26/21 1500  metroNIDAZOLE (FLAGYL) IVPB 500 mg   Status:  Discontinued        500 mg 100 mL/hr over 60 Minutes Intravenous Every 8 hours 02/26/21 1414 03/01/21 1732   02/22/21 2000  cefTRIAXone (ROCEPHIN) 2 g in sodium chloride 0.9 % 100 mL IVPB  Status:  Discontinued        2 g 200 mL/hr over 30 Minutes Intravenous Daily at 10 pm 02/22/21 1339 03/01/21 1716   02/22/21 1600  metroNIDAZOLE (FLAGYL) IVPB 500 mg  Status:  Discontinued        500 mg 100 mL/hr over 60 Minutes Intravenous Every 8 hours 02/22/21 1339 02/26/21 1414       Assessment/Plan Elevated troponin- 32 >174 >112, EKG without ischemia changes,cards cleared for surgery E.coli, Enterobacterales bacteremia - on IV abx. Repeat Cx's with NGTD Hypoxia - off o2 this morning. Received lasix yesterday. Pulm toilet   Acute biliary pancreatitis, suspect resolving ascending cholangitis, gangrenous cholecystitis  s/p lap chole with Dr. Dwain Sarna on 5/1 - POD#9 - operative findings of liver cysts and cirrhosis- will need PCP/GI follow up - Path: Acute and chronic cholecystitis with cholelithiasis  Duodenal perforation s/p exp  lap with primary repair of duodenal perforation with [name] patch - Dr. Freida Busman 5/4 POD#6 - Febrile again overnight. WBC20 > 13 > 15.1>13.5 > 13.2. Trend - UGI negative. Follow up xray with contrast that passed into colon. Having bowel function - BID WTD for midline wound. Top 8 staples removed from midline 5/10 and hematoma was evacuated.  - PT/OT - recommending home with HH - Continue JP and monitor - TPN - Cont IV abx  FEN:NPO, TPN ID: ceftriaxone 4/27>5/3, flagyl 4/26>5/4, zosyn 5/4>>  Foley: Removed5/6., Voiding.  VTE: SCDs,Lovenox   Plan: Given patient's overall clinical picture with persistent leukocytosis, persistent fever over the last few nights, tachypnea, tachycardia, intermittent hypoxia, sob, worsening abdominal pain/distension, and edema will obtain CTA chest to rule out PE, CT A/P to rule out intra-abdominal abscess and lower  extremity ultrasounds to rule out DVT.  It is reassuring that his upper GI yesterday was negative and drain has remains serosanguineous.  However with increased pain/distention will make n.p.o. and await results of CT.  Patient did have hematoma at the top of his midline wound that I evacuated today by removing staples.  Will start WTD dressings twice daily on this. Continue to monitor him closely.  I discussed with primary team.    LOS: 13 days    Jacinto Halim , [facility] 03/07/2021, 9:49 AM Please see Amion for pager number during day hours 7:00am-4:30pm

## 2021-03-07 NOTE — Progress Notes (Signed)
BLE venous duplex has been completed.  Results can be found under chart review under CV PROC. 03/07/2021 12:19 PM Verona Hartshorn RVT, RDMS

## 2021-03-07 NOTE — Progress Notes (Signed)
Messaged Dois Davenport RN to remove PIV to RFA below PICC line. Instructed on increased risks for blood clots/complications with PIV on same line as CVC. Tomasita Morrow, RN VAST

## 2021-03-07 NOTE — Progress Notes (Signed)
Pharmacy Antibiotic Note  Travis Gallagher is a 87 y.o. male admitted on 02/22/2021 with abdominal pain and acute pancreatitis.  Pt had lap chole on 02/26/21, underwent CT scan showing perforation of proximal duodenum.   S/p repair of duodenal perf 5/4. Pharmacy has been consulted for Zosyn dosing for intra-abdominal infection/duodenal leak.   Day # 7 Zosyn:   Fever up 5/9  Tmax 100.7, Tc 99.9 WBC is trending down. CXR showed diminished lung volumes. Renal function improved Scr ~1.  Continues on  IV Zosyn due to duodenal perforation.  Plan: Continue Zosyn 3.375 gm IV Q 8 hrs (extended infusion) Monitor WBC, temp, clinical improvement, renal function  Weight: 72.7 kg (160 lb 4.4 oz)  Temp (24hrs), Avg:99.3 F (37.4 C), Min:97.5 F (36.4 C), Max:100.7 F (38.2 C)  Recent Labs  Lab 03/02/21 0205 03/03/21 0412 03/04/21 0300 03/04/21 0410 03/05/21 0435 03/06/21 0300 03/07/21 0740  WBC 14.4* 20.5* 13.5*  --  15.1* 13.5* 13.2*  CREATININE 1.50* 1.16  --  1.00 1.00 0.93  --     Estimated Creatinine Clearance: 52.3 mL/min (by C-G formula based on SCr of 0.93 mg/dL).    No Known Allergies  Antimicrobials this admission: Ceftriaxone: 4/27-5/4 Metronidazole IV: 4/27-5/4 Zosyn: 5/4 >>  Microbiology results: 4/26 BCx X 2: E coli, pan sensitive 4/26 UCx: <10,000 colonies/ml insignificant growth 4/28 surgical PCR: MRSA negative; SA positive 4/28 Staph aureus (nasal swab): positive 4/26 COVID, flu A, flu B: negative  Thank you for allowing pharmacy to be a part of this patient's care.  Noah Delaine, RPh Clinical Pharmacist [phone] Please check [email or web address] for unit specific pharmacy phone numbers. After 10:00 PM, call Main Pharmacy [phone] 03/07/2021 9:42 AM

## 2021-03-07 NOTE — Progress Notes (Signed)
Patient has had 7 liq stools since he came back from x-ray .  M.D. On call and text paged . Asked for Flex o seal. Order was given.

## 2021-03-07 NOTE — Progress Notes (Signed)
Mobility Specialist: Progress Note   03/07/21 1647  Mobility  Activity  (Cancel)   After consulting with RN going to hold off on seeing pt today. Will f/u tomorrow.   [facility] Laylynn Campanella Mobility Specialist Mobility Specialist Phone: [phone]

## 2021-03-07 NOTE — Progress Notes (Signed)
PROGRESS NOTE  Travis Gallagher TOI:[id number] DOB: 12/31/1930 DOA: 02/22/2021 PCP: Pcp, No   LOS: 13 days   Brief narrative:  Travis Gallagher is a 87 y.o. male with medical history significant for hypertension, GERD was admitted to hospital as transfer from [facility] for management of acute pancreatitis in the setting of potential choledocholithiasis.  Patient was also found to have acute biliary pancreatitis.  GI was initially consulted but MRCP did not show choledocholithiasis.  Patient underwent laparoscopic cholecystectomy on 02/26/2021.  Cardiology was consulted for preop risk stratification.    During hospitalization, post -operatively patient continued to have abdominal pain and distention with renal failure.  He was noted to have duodenal perforation and biliary leak so underwent relaparotomy on 03/01/2021.  After relaparotomy patient had briefly had an NG tube placed in.  Upper GI study was performed on 03/06/2021.  Patient did have a spikes of fever so CT scan of the abdomen chest has been ordered.  Patient has remained on TPN.   Assessment/Plan:    Principal Problem:   Acute pancreatitis Active Problems:   Severe sepsis (HCC)   Cholangitis   Abdominal pain   Nausea   Hypertension   GERD (gastroesophageal reflux disease)   Elevated troponin  Acute biliary pancreatitis:    Patient underwent laparoscopic cholecystectomy by general surgery on 02/26/2021.  Postoperatively, patient continued to have increasing pain and distention and renal failure.  CT scan of the abdomen was performed which showed signs of biliary leak.  Patient was then taken back to the OR on 03/01/2021 and was noted to have a duodenal perforation.  He underwent laparotomy with primary repair of duodenal perforation with Phillip Heal patch by Dr. Zenia Resides on 03/01/21.  Patient underwent upper GI study on 03/06/2021 without note of extravasation.  Contrast flow was demonstrated to the colon..  Continue IV Zosyn due to  duodenal perforation and a new fever..  Continue TPN for nutrition.    Fever.  Temperature max of 100.7 F.  Blood cultures were sent yesterday.  Chest x-ray showed diminished lung volumes.  Patient is doing incentive spirometry.   We will get a CT scan of the chest abdomen and pelvis today due to new fever to rule out intra-abdominal collections.  The possibility of basal atelectasis causing fever.  Patient is already on Zosyn.  DVT.  Duodenal perforation, biliary leak.  Status post exploratory laparotomy with repair of duodenal perforation on 03/01/2021.    Continue IV Zosyn due to biliary leak. Patient has been started on clears.  Mild leukocytosis.  We will continue to monitor.  CBC Latest Ref Rng & Units 03/07/2021 03/06/2021 03/05/2021  WBC 4.0 - 10.5 K/uL 13.2(H) 13.5(H) 15.1(H)  Hemoglobin 13.0 - 17.0 g/dL 9.5(L) 10.2(L) 10.6(L)  Hematocrit 39.0 - 52.0 % 29.4(L) 31.0(L) 31.7(L)  Platelets 150 - 400 K/uL 215 207 235    Mild shortness of breath, hypoxia.   Improved.  Continue incentive spirometry, deep breathing exercises due to basal atelectasis.  Liver cyst and cirrhosis during surgical intervention.  Will need outpatient PCP and GI follow-up.    Severe sepsis/enterobacter and E. coli bacteremia secondary to ascending cholangitis, gangrenous cholecystitis Currently on IV Zosyn, secondary to duodenal perforation.  Patient initially met sepsis criteria on admission.     Now with new fever.  we will do Phillip Heal of the chest abdomen pelvis and lower extremity ultrasound.   Elevated troponin with LV systolic dysfunction, chronic combined congestive heart failure. EKG did not show any ischemic changes.  Possibly secondary to demand ischemia . 2D echocardiogram with left ventricular ejection fraction of 40 to 45% with diffuse hypokinesis and abnormal septal motion wall.  Cardiology followed the patient during hospitalization.  Aspirin on hold due to recent postop status.  Will need outpatient follow-up  with cardiology.  Was on Coreg, has been changed to metoprolol IV.    Mild Dilation of acending aorta Follow-up as outpatient.    PSVT- 2D echo with EF of 45 to 50%.  On IV metoprolol at this time.  Was on Coreg.  Will need repeat TSH follow-up as outpatient.   Acute kidney injury Improved.  Latest creatinine of 0.9   Essential hypertension:  Not on antihypertensives at home.  Was on Coreg has been changed to metoprolol IV.  IV hydralazine has been added for better control.  Continue to monitor.     GERD: Continue IV PPI   Debility, weakness.  Physical therapy has seen the patient and recommend home health PT on discharge.  Encouraged ambulation.    Hypophosphatemia.   Resolved after replacement   DVT prophylaxis: Place TED hose Start: 03/03/21 1742 enoxaparin (LOVENOX) injection 40 mg Start: 03/03/21 1000 Place and maintain sequential compression device Start: 02/23/21 1520 SCDs Start: 02/22/21 1225   Code Status: Full code  Family Communication:  I again spoke with the patient's son at bedside.  Patient's wife was at bedside as well  Status is: Inpatient  Remains inpatient appropriate because: IV treatments appropriate due to intensity of illness or inability to take PO and Inpatient level of care appropriate due to severity of illness, status post exploratory laparotomy, parenteral nutrition, IV antibiotics.  Dispo: The patient is from: Home              Anticipated d/c is to: Home with home health              Patient currently is not medically stable to d/c.   Difficult to place patient No  Consultants:  GI  Cardiology  General surgery  Procedures:  Laparoscopic cholecystectomy 02/26/2021  Exploratory laparotomy with primary repair of duodenal perforation with Phillip Heal patch on 03/01/2021  NG tube placement and removal.  Right upper extremity PICC placement on 03/02/2021  Anti-infectives:  Marland Kitchen Ceftriaxone and metronidazole IV 4/27>5/4 . Zosyn  03/01/2021>  Anti-infectives (From admission, onward)   Start     Dose/Rate Route Frequency Ordered Stop   03/01/21 1800  piperacillin-tazobactam (ZOSYN) IVPB 3.375 g        3.375 g 12.5 mL/hr over 240 Minutes Intravenous Every 8 hours 03/01/21 1732     02/26/21 1500  metroNIDAZOLE (FLAGYL) IVPB 500 mg  Status:  Discontinued        500 mg 100 mL/hr over 60 Minutes Intravenous Every 8 hours 02/26/21 1414 03/01/21 1732   02/22/21 2000  cefTRIAXone (ROCEPHIN) 2 g in sodium chloride 0.9 % 100 mL IVPB  Status:  Discontinued        2 g 200 mL/hr over 30 Minutes Intravenous Daily at 10 pm 02/22/21 1339 03/01/21 1716   02/22/21 1600  metroNIDAZOLE (FLAGYL) IVPB 500 mg  Status:  Discontinued        500 mg 100 mL/hr over 60 Minutes Intravenous Every 8 hours 02/22/21 1339 02/26/21 1414     Subjective: Today, patient was seen and examined at bedside.  Complains of mild abdominal pain.  Has been having some loose stools.  Denies overt shortness of breath cough fever or chills.  Patient's family at bedside  Objective: Vitals:   03/07/21 0740 03/07/21 1045  BP: (!) 158/73 (!) 160/65  Pulse: 97 (!) 105  Resp: (!) 25 (!) 29  Temp: 99.9 F (37.7 C) 99.8 F (37.7 C)  SpO2: 95% 95%    Intake/Output Summary (Last 24 hours) at 03/07/2021 1130 Last data filed at 03/07/2021 0547 Gross per 24 hour  Intake 50 ml  Output 2525 ml  Net -2475 ml   Filed Weights   03/02/21 1534 03/03/21 0347 03/07/21 0420  Weight: 73.1 kg 71.9 kg 72.7 kg   Body mass index is 25.1 kg/m.   Physical Exam: General:  Average built, not in obvious distress, weak, deconditioned and ill. HENT:   No scleral pallor or icterus noted. Oral mucosa is moist.  Chest:  Clear breath sounds.  Diminished breath sounds bilaterally. No crackles or wheezes.  CVS: S1 &S2 heard. No murmur.  Regular rate and rhythm. Abdomen: Soft, nontender, mildly distended abdomen,.  Bowel sounds are heard.  JP drain in place, midline staples visible,  scrotal edema present, suppurative.  Subhepatic drain in place.  Wound VAC in place Extremities: No cyanosis, clubbing but with bilateral lower extremity edema peripheral pulses are palpable.  Right upper extremity PICC line in place, Psych: Alert, awake and oriented, normal mood CNS:  No cranial nerve deficits.  Power equal in all extremities but generalized weakness noted.   Skin: Warm and dry.  Status post a laparotomy.   Data Review: I have personally reviewed the following laboratory data and studies,  CBC: Recent Labs  Lab 03/03/21 0412 03/04/21 0300 03/05/21 0435 03/06/21 0300 03/07/21 0740  WBC 20.5* 13.5* 15.1* 13.5* 13.2*  NEUTROABS 18.0*  --   --  11.3*  --   HGB 10.8* 10.1* 10.6* 10.2* 9.5*  HCT 32.3* 30.1* 31.7* 31.0* 29.4*  MCV 88.5 90.9 88.8 89.1 93.6  PLT 266 221 235 207 161   Basic Metabolic Panel: Recent Labs  Lab 03/02/21 0205 03/03/21 0412 03/04/21 0410 03/05/21 0435 03/06/21 0300  NA 137 140 139 137 134*  K 4.0 4.0 3.9 3.9 3.9  CL 110 110 106 104 102  CO2 20* '22 28 29 27  ' GLUCOSE 127* 104* 120* 155* 141*  BUN 44* 34* 26* 23 24*  CREATININE 1.50* 1.16 1.00 1.00 0.93  CALCIUM 7.4* 8.0* 7.6* 7.4* 7.3*  MG  --  2.0 1.7 1.8 2.0  PHOS  --  2.0* 2.9 2.3* 3.2   Liver Function Tests: Recent Labs  Lab 03/01/21 0242 03/03/21 0412 03/04/21 0410 03/05/21 0435 03/06/21 0300  AST 59* '19 21 21 19  ' ALT '29 16 13 12 13  ' ALKPHOS 203* 124 124 106 111  BILITOT 0.8 0.8 0.8 0.5 0.4  PROT 4.5* 4.9* 4.7* 4.8* 4.8*  ALBUMIN 1.7* 1.9* 1.8* 1.7* 1.6*   No results for input(s): LIPASE, AMYLASE in the last 168 hours. No results for input(s): AMMONIA in the last 168 hours. Cardiac Enzymes: No results for input(s): CKTOTAL, CKMB, CKMBINDEX, TROPONINI in the last 168 hours. BNP (last 3 results) No results for input(s): BNP in the last 8760 hours.  ProBNP (last 3 results) No results for input(s): PROBNP in the last 8760 hours.  CBG: Recent Labs  Lab  03/06/21 1535 03/06/21 2022 03/06/21 2353 03/07/21 0425 03/07/21 0857  GLUCAP 162* 154* 153* 151* 151*   Recent Results (from the past 240 hour(s))  Culture, blood (Routine X 2) w Reflex to ID Panel     Status: None (Preliminary result)   Collection  Time: 03/05/21  5:30 PM   Specimen: BLOOD  Result Value Ref Range Status   Specimen Description BLOOD LEFT ANTECUBITAL  Final   Special Requests   Final    BOTTLES DRAWN AEROBIC ONLY Blood Culture adequate volume   Culture   Final    NO GROWTH 2 DAYS Performed at [facility], [address] [place], [place] [address]    Report Status PENDING  Incomplete  Culture, blood (Routine X 2) w Reflex to ID Panel     Status: None (Preliminary result)   Collection Time: 03/05/21  5:36 PM   Specimen: BLOOD LEFT WRIST  Result Value Ref Range Status   Specimen Description BLOOD LEFT WRIST  Final   Special Requests   Final    BOTTLES DRAWN AEROBIC ONLY Blood Culture adequate volume   Culture   Final    NO GROWTH 2 DAYS Performed at [facility], [place] [address] [place], [place] [address]    Report Status PENDING  Incomplete     Studies: DG Abd Portable 1V  Result Date: 03/06/2021 CLINICAL DATA:  Abdominal distension EXAM: PORTABLE ABDOMEN - 1 VIEW COMPARISON:  Films from recent upper GI FINDINGS: Scattered large and small bowel gas is noted. Previously administered contrast from upper GI now lies within the colon. The appendix is within normal limits. Postsurgical changes with a right upper quadrant drain are noted. No obstructive changes are seen. No definitive free air is identified. IMPRESSION: Previously administered contrast now lies throughout the colon. No obstructive changes are seen. Postsurgical changes with right upper quadrant drain are noted. No acute abnormality is seen. Electronically Signed   By: Inez Catalina M.D.   On: 03/06/2021 16:55   DG UGI W SINGLE CM (SOL OR THIN BA)  Result Date: 03/06/2021 CLINICAL DATA:   Status post duodenal perforation repair. EXAM: WATER SOLUBLE UPPER GI SERIES TECHNIQUE: Single-column upper GI series was performed using water soluble contrast. CONTRAST:  OMNIPAQUE IOHEXOL 300 MG/ML  SOLN COMPARISON:  CT 03/01/2021. FLUOROSCOPY TIME:  Fluoroscopy Time:  3 minutes and 24 seconds Radiation Exposure Index (if provided by the fluoroscopic device): 126.6 mGy Number of Acquired Spot Images: FINDINGS: Pre-procedure KUB shows a nonspecific gas pattern with surgical drain in the right upper quadrant. Challenging study due to patient immobility and pain with motion. We did elevate the head of the fluoro table as much as possible and put the patient in a right-side-down position relative to the fluoro table to promote gastric emptying. Stomach is nondistended. Water-soluble contrast did migrate through the pylorus into the duodenal bulb, through the duodenum and into the proximal jejunum. There is no evidence for contrast extravasation from the duodenum. No evidence for contrast entering the right upper quadrant surgical drain. IMPRESSION: No demonstrable contrast extravasation from the duodenum to suggest leak. Electronically Signed   By: Misty Stanley M.D.   On: 03/06/2021 10:22     Flora Lipps, MD  [facility] 03/07/2021  If 7PM-7AM, please contact night-coverage

## 2021-03-07 NOTE — Progress Notes (Addendum)
PHARMACY - TOTAL PARENTERAL NUTRITION CONSULT NOTE  Indication: Prolonged ileus  Patient Measurements: Weight: 72.7 kg (160 lb 4.4 oz)   Body mass index is 25.1 kg/m.  Assessment:  75 YOM presented to [facility] with abdominal pain, found to have acute pancreatitis in setting of choledocholithiasis and transferred to [facility] on 02/22/21. Underwent ex-lap for gallstone pancreatitis on 5/1. Had contrast extravasation from proximal duodenum with bilious output and underwent ex-lap with repair of duodenal perforation with [name] patch on 5/4. Patient has inadequate nutrition for 9 days, and Pharmacy consulted to dose TPN.  Patient has been on a clear liquid diet with intermittent NPO status. Diet advanced to soft post-surgery on 5/2 but soon reverted to NPO for second surgery on 5/4. Per documentation, no recent weight changes and patient was eating well PTA.  Glucose / Insulin: no hx DM - CBGs < 180. Utilized 11 units SSI in last 24hrs Electrolytes: 5/9 labs - low Na, K 3.9 and Phos 3.2 s/p Lasix 20mg  IV and KPhos 20 mmol on 5/8 (goal K >/= 4 with ileus), Mag up to 2 post 2gm on 5/8, others WNL Renal: SCr down < 1, BUN 24 Hepatic: LFTs / Tbili / TG WNL, lipase down to 92. BL prealbumin improved to 9, albumin 1.6 Intake / Output; MIVF: UOP 2 ml/kg/hr, BM x5 (flexiseal placed 5/10), I/O's even GI Imaging:  5/9 Abd XR - contrast throughout colon, no obstruction GI Surgeries / Procedures:  5/10 - evacuated hematoma at top of midline wound  Central access:  PICC placed 03/02/21 TPN start date: 03/03/21  Nutritional Goals (per RD rec on 5/5): 1900-2100 kCal, 90-105g AA, and >1.9L fluid per day  Current Nutrition:  TPN CLD started 5/9 >> NPO 5/10 d/t increased pain/distension   Plan:  Continue TPN at goal rate 80 ml/hr to provide 94g AA and 1951 kCal, meeting 100% of needs Electrolytes in TPN: increase Na 54mEq/L on 5/9, increase K slightly to 65mEq/L on 5/9, Ca 47mEq/L, Mag 56mEq/L, Phos 79mmol/L, Cl:Ac  1:2 Add multivitamin and trace elements to TPN Continue sensitive SSI Q4H Standard TPN labs on Mon and Thurs  No change to today's TPN since labs are still pending >> will supplement outside of TPN if needed  Travis Gallagher D. 12-05-1980, PharmD, BCPS, BCCCP 03/07/2021, 11:04 AM   ===============================  Addendum: all lytes acceptable, no repletion need.  Lasix 20mg  IV given last PM.  F/u labs on Thurs.  Travis Gallagher D. 05/07/2021, PharmD, BCPS, BCCCP 03/07/2021, 2:24 PM

## 2021-03-07 NOTE — Progress Notes (Signed)
Called pharmacy regarding label for TPN. Pharmacy reviewed and relabeled TPN bag with scanable bar code. Tomasita Morrow, RN VAST

## 2021-03-07 NOTE — Progress Notes (Signed)
OT Cancellation Note  Patient Details Name: Travis Gallagher MRN: [id number] DOB: 09/19/1931   Cancelled Treatment:    Reason Eval/Treat Not Completed: Medical issues which prohibited therapy;Other (comment) RN reports staples removed from chest incision today with wound now packed, will hold off on OOB OT session and check back as pt medically appropriate.   Pollyann Glen K., COTA/L Acute Rehabilitation Services [phone]   Barron Schmid 03/07/2021, 1:18 PM

## 2021-03-08 LAB — COMPREHENSIVE METABOLIC PANEL
ALT: 19 U/L (ref 0–44)
AST: 26 U/L (ref 15–41)
Albumin: 1.6 g/dL — ABNORMAL LOW (ref 3.5–5.0)
Alkaline Phosphatase: 90 U/L (ref 38–126)
Anion gap: 5 (ref 5–15)
BUN: 27 mg/dL — ABNORMAL HIGH (ref 8–23)
CO2: 26 mmol/L (ref 22–32)
Calcium: 7.5 mg/dL — ABNORMAL LOW (ref 8.9–10.3)
Chloride: 108 mmol/L (ref 98–111)
Creatinine, Ser: 0.96 mg/dL (ref 0.61–1.24)
GFR, Estimated: >60 mL/min (ref >60)
Glucose, Bld: 119 mg/dL — ABNORMAL HIGH (ref 70–99)
Potassium: 3.9 mmol/L (ref 3.5–5.1)
Sodium: 139 mmol/L (ref 135–145)
Total Bilirubin: 0.7 mg/dL (ref 0.3–1.2)
Total Protein: 5 g/dL — ABNORMAL LOW (ref 6.5–8.1)

## 2021-03-08 LAB — GLUCOSE, CAPILLARY
Glucose-Capillary: 129 mg/dL — ABNORMAL HIGH (ref 70–99)
Glucose-Capillary: 154 mg/dL — ABNORMAL HIGH (ref 70–99)
Glucose-Capillary: 148 mg/dL — ABNORMAL HIGH (ref 70–99)
Glucose-Capillary: 141 mg/dL — ABNORMAL HIGH (ref 70–99)
Glucose-Capillary: 130 mg/dL — ABNORMAL HIGH (ref 70–99)

## 2021-03-08 LAB — CBC
HCT: 28.9 % — ABNORMAL LOW (ref 39.0–52.0)
Hemoglobin: 9.4 g/dL — ABNORMAL LOW (ref 13.0–17.0)
MCH: 29.5 pg (ref 26.0–34.0)
MCHC: 32.5 g/dL (ref 30.0–36.0)
MCV: 90.6 fL (ref 80.0–100.0)
Platelets: 220 10*3/uL (ref 150–400)
RBC: 3.19 MIL/uL — ABNORMAL LOW (ref 4.22–5.81)
RDW: 13.4 % (ref 11.5–15.5)
WBC: 14.3 10*3/uL — ABNORMAL HIGH (ref 4.0–10.5)
nRBC: 0 % (ref 0.0–0.2)

## 2021-03-08 MED ORDER — TRAVASOL 10 % IV SOLN
INTRAVENOUS | Status: AC
  Filled 2021-03-08: qty 940.8

## 2021-03-08 MED ORDER — INSULIN ASPART 100 UNIT/ML IJ SOLN
0.0000 [IU] | Freq: Three times a day (TID) | INTRAMUSCULAR | Status: DC
  Administered 2021-03-08 – 2021-03-11 (×9): 1 [IU] via SUBCUTANEOUS

## 2021-03-08 MED ORDER — ALBUMIN HUMAN 25 % IV SOLN
25.0000 g | Freq: Four times a day (QID) | INTRAVENOUS | Status: DC
  Administered 2021-03-08 – 2021-03-09 (×3): 25 g via INTRAVENOUS
  Filled 2021-03-08 (×6): qty 100

## 2021-03-08 MED ORDER — FUROSEMIDE 10 MG/ML IJ SOLN
20.0000 mg | Freq: Two times a day (BID) | INTRAMUSCULAR | Status: DC
  Administered 2021-03-08 – 2021-03-12 (×8): 20 mg via INTRAVENOUS
  Filled 2021-03-08 (×8): qty 2

## 2021-03-08 NOTE — Progress Notes (Signed)
Occupational Therapy Treatment Patient Details Name: Travis Gallagher MRN: [id number] DOB: [birth year] Today's Date: 03/08/2021    History of present illness 87 y.o. M admitted 02/22/2021 with gallstone pancreatitis, underwent laparoscopic cholecystectomy with gangrenous cholecystitis. Also found to have LV dysfunction with mild-moderate mitral regurgitation, PSVT, elevated troponin. On 5/4 pt found to have duodenal perforation on CT, underwent ex-lap with repair of duodenal perf with [name] patch. Significant PMH: HTN, GERD, Visiting from [place].   OT comments  Pt motivated with encouragement of male tele-interpreter. Performed bed mobility using log roll technique with mod assist, stood with mod assist and ambulated with min assist and second person for lines/chair follow in hall. RR in the 40s, HR 110, Sp02 >90% on RA. Pt is dependent in all LB ADL and pericare. Pt noted to have scrotal edema, requested son purchase pt compression type underwear.   Follow Up Recommendations  Home health OT    Equipment Recommendations  3 in 1 bedside commode    Recommendations for Other Services      Precautions / Restrictions Precautions Precautions: Fall Precaution Comments: JP drain, TPN, briefs helpful (diarrhea), knee-high TED hose Restrictions Weight Bearing Restrictions: No       Mobility Bed Mobility Overal bed mobility: Needs Assistance Bed Mobility: Rolling;Sidelying to Sit Rolling: Mod assist Sidelying to sit: Mod assist       General bed mobility comments: cues to use log roll technique and minimize abdominal pain, assist for LEs over EOB and to raise trunk    Transfers Overall transfer level: Needs assistance Equipment used: Rolling walker (2 wheeled) Transfers: Sit to/from Stand Sit to Stand: Mod assist         General transfer comment: assist to rise and steady, cues for hand placement    Balance Overall balance assessment: Needs assistance   Sitting  balance-Leahy Scale: Fair Sitting balance - Comments: supervision     Standing balance-Leahy Scale: Poor Standing balance comment: reliant on RW                           ADL either performed or assessed with clinical judgement   ADL Overall ADL's : Needs assistance/impaired                     Lower Body Dressing: Total assistance;Bed level Lower Body Dressing Details (indicate cue type and reason): ted hose and socks Toilet Transfer: Ambulation;RW;Regular Toilet;Moderate assistance   Toileting- Clothing Manipulation and Hygiene: Total assistance;Sit to/from stand       Functional mobility during ADLs: Minimal assistance;+2 for safety/equipment;Rolling walker       Vision       Perception     Praxis      Cognition Arousal/Alertness: Awake/alert Behavior During Therapy: WFL for tasks assessed/performed;Flat affect Overall Cognitive Status: Difficult to assess                                 General Comments: pt motivated by male interpreter, [name] [phone]        Exercises     Shoulder Instructions       General Comments      Pertinent Vitals/ Pain       Pain Assessment: Faces Faces Pain Scale: Hurts even more Pain Location: abdomen Pain Descriptors / Indicators: Sore;Discomfort Pain Intervention(s): Monitored during session;Repositioned  Home Living  Prior Functioning/Environment              Frequency  Min 2X/week        Progress Toward Goals  OT Goals(current goals can now be found in the care plan section)  Progress towards OT goals: Progressing toward goals  Acute Rehab OT Goals Patient Stated Goal: get better OT Goal Formulation: With patient/family Time For Goal Achievement: 03/22/21 Potential to Achieve Goals: Good  Plan Discharge plan remains appropriate    Co-evaluation    PT/OT/SLP Co-Evaluation/Treatment: Yes Reason for  Co-Treatment: For patient/therapist safety   OT goals addressed during session: ADL's and self-care;Strengthening/ROM      AM-PAC OT "6 Clicks" Daily Activity     Outcome Measure   Help from another person eating meals?: A Little Help from another person taking care of personal grooming?: A Little Help from another person toileting, which includes using toliet, bedpan, or urinal?: A Lot Help from another person bathing (including washing, rinsing, drying)?: Total Help from another person to put on and taking off regular upper body clothing?: A Little Help from another person to put on and taking off regular lower body clothing?: Total 6 Click Score: 13    End of Session Equipment Utilized During Treatment: Rolling walker  OT Visit Diagnosis: Unsteadiness on feet (R26.81);Other abnormalities of gait and mobility (R26.89);Muscle weakness (generalized) (M62.81)   Activity Tolerance Patient tolerated treatment well   Patient Left in chair;with call bell/phone within reach;with chair alarm set;with family/visitor present   Nurse Communication          Time: 1319-1401 OT Time Calculation (min): 42 min  Charges: OT General Charges $OT Visit: 1 Visit OT Treatments $Self Care/Home Management : 8-22 mins  [name], OTR/L Acute Rehabilitation Services Pager: [phone] Office: [phone]   [name] 03/08/2021, 2:40 PM

## 2021-03-08 NOTE — Progress Notes (Signed)
PHARMACY - TOTAL PARENTERAL NUTRITION CONSULT NOTE  Indication: Prolonged ileus  Patient Measurements: Weight: 67.4 kg (148 lb 9.4 oz) (took pillows blankets and scd off bed)   Body mass index is 23.27 kg/m.  Assessment:  83 YOM presented to [facility] with abdominal pain, found to have acute pancreatitis in setting of choledocholithiasis and transferred to [facility] on 02/22/21. Underwent ex-lap for gallstone pancreatitis on 5/1. Had contrast extravasation from proximal duodenum with bilious output and underwent ex-lap with repair of duodenal perforation with [name] patch on 5/4. Patient has inadequate nutrition for 9 days, and Pharmacy consulted to dose TPN.  Patient has been on a clear liquid diet with intermittent NPO status. Diet advanced to soft post-surgery on 5/2 but soon reverted to NPO for second surgery on 5/4. Per documentation, no recent weight changes and patient was eating well PTA.  Glucose / Insulin: no hx DM - CBGs < 180. 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 GI Surgeries / Procedures:  5/10 - evacuated hematoma at top of midline wound  Central access:  PICC placed 03/02/21 TPN start date: 03/03/21  Nutritional Goals (per RD rec on 5/5): 1900-2100 kCal, 90-105g AA, and >1.9L fluid per day  Current Nutrition:  TPN CLD started 5/9 >> NPO 5/10 d/t increased pain/distension   Plan:  Continue TPN at goal rate 80 ml/hr to provide 94g AA  and 1951 kCal, meeting 100% of needs Electrolytes in TPN: Na 76mEq/L, increase K slightly to 32mEq/L, Ca 25mEq/L, Mag 43mEq/L, Phos 40mmol/L, Cl:Ac 1:2 Add multivitamin and trace elements to TPN Reduce sensitive SSI to Q8H Standard TPN labs on Mon and Thurs  Estephany Perot D. Laney Potash, PharmD, BCPS, BCCCP 03/08/2021, 7:09 AM

## 2021-03-08 NOTE — Progress Notes (Signed)
Physical Therapy Treatment Patient Details Name: Travis Gallagher MRN: [id number] DOB: 11/07/1930 Today's Date: 03/08/2021    History of Present Illness 87 y.o. M admitted 02/22/2021 with gallstone pancreatitis, underwent laparoscopic cholecystectomy with gangrenous cholecystitis. Also found to have LV dysfunction with mild-moderate mitral regurgitation, PSVT, elevated troponin. On 5/4 pt found to have duodenal perforation on CT, underwent ex-lap with repair of duodenal perf with [name] patch. Significant PMH: HTN, GERD, Visiting from [place].    PT Comments    Pt has been slow to improve, but has shown good improvement toward goals today.  Emphasis on transitions, transfers from bed, toilet and chair, and progression of gait stability with the RW.    Follow Up Recommendations  Home health PT;Supervision for mobility/OOB     Equipment Recommendations  Rolling walker with 5" wheels;3in1 (PT);Other (comment) (TBA further at d/c)    Recommendations for Other Services       Precautions / Restrictions Precautions Precautions: Fall Precaution Comments: JP drain, TPN, briefs helpful (diarrhea), knee-high TED hose    Mobility  Bed Mobility Overal bed mobility: Needs Assistance Bed Mobility: Rolling;Sidelying to Sit Rolling: Mod assist Sidelying to sit: Mod assist       General bed mobility comments: cues to use log roll technique and minimize abdominal pain, assist for LEs over EOB and to raise trunk    Transfers Overall transfer level: Needs assistance Equipment used: Rolling walker (2 wheeled) Transfers: Sit to/from Stand Sit to Stand: Mod assist         General transfer comment: assist to rise and steady, cues for hand placement  Ambulation/Gait Ambulation/Gait assistance: Min assist Gait Distance (Feet): 40 Feet (into the hall, then 35 feet back to the  bathroom and additional 25 to recliner on far side of the room.) Assistive device: Rolling walker (2  wheeled) Gait Pattern/deviations: Step-through pattern Gait velocity: reduced Gait velocity interpretation: <1.8 ft/sec, indicate of risk for recurrent falls General Gait Details: mildly unsteady, weak-kneed, low amplitude steps, progressive SOB.  Sats maintained in lower 90's or better, EHR clining into the upper 110's and lower 120's   Stairs             Wheelchair Mobility    Modified Rankin (Stroke Patients Only)       Balance Overall balance assessment: Needs assistance   Sitting balance-Leahy Scale: Fair Sitting balance - Comments: supervision   Standing balance support: Bilateral upper extremity supported Standing balance-Leahy Scale: Poor Standing balance comment: reliant on RW                            Cognition Arousal/Alertness: Awake/alert Behavior During Therapy: WFL for tasks assessed/performed;Flat affect Overall Cognitive Status: Difficult to assess                                 General Comments: pt motivated by male interpreter, [name] [phone]      Exercises      General Comments General comments (skin integrity, edema, etc.): vss on RA, rise in HR into the high 110's and low 120's      Pertinent Vitals/Pain Pain Assessment: Faces Faces Pain Scale: Hurts even more Pain Location: abdomen Pain Descriptors / Indicators: Sore;Discomfort Pain Intervention(s): Monitored during session    Home Living  Prior Function            PT Goals (current goals can now be found in the care plan section) Acute Rehab PT Goals Patient Stated Goal: get better PT Goal Formulation: With patient Time For Goal Achievement: 03/16/21 Potential to Achieve Goals: Good Progress towards PT goals: Progressing toward goals    Frequency    Min 3X/week      PT Plan Current plan remains appropriate    Co-evaluation PT/OT/SLP Co-Evaluation/Treatment: Yes Reason for Co-Treatment: For  patient/therapist safety;To address functional/ADL transfers PT goals addressed during session: Mobility/safety with mobility OT goals addressed during session: ADL's and self-care      AM-PAC PT "6 Clicks" Mobility   Outcome Measure  Help needed turning from your back to your side while in a flat bed without using bedrails?: A Little Help needed moving from lying on your back to sitting on the side of a flat bed without using bedrails?: A Lot Help needed moving to and from a bed to a chair (including a wheelchair)?: A Lot Help needed standing up from a chair using your arms (e.g., wheelchair or bedside chair)?: A Lot Help needed to walk in hospital room?: A Little Help needed climbing 3-5 steps with a railing? : A Lot 6 Click Score: 14    End of Session   Activity Tolerance: Patient tolerated treatment well Patient left: in chair;with call bell/phone within reach;with chair alarm set;with family/visitor present Nurse Communication: Mobility status PT Visit Diagnosis: Other abnormalities of gait and mobility (R26.89) Pain - part of body:  (abdomen incisions)     Time: 1319-1401 PT Time Calculation (min) (ACUTE ONLY): 42 min  Charges:  $Gait Training: 8-22 mins $Therapeutic Activity: 8-22 mins                     03/08/2021  Jacinto Halim., PT Acute Rehabilitation Services [phone]  (pager) [phone]  (office)   Eliseo Gum Sergi Gellner 03/08/2021, 2:55 PM

## 2021-03-08 NOTE — Progress Notes (Signed)
PROGRESS NOTE  Travis Gallagher HYQ:[id number] DOB: [birth year] DOA: 02/22/2021 PCP: Pcp, No   LOS: 14 days   Brief narrative:  Travis Gallagher is a 87 y.o. male with medical history significant for hypertension, GERD was admitted to hospital as transfer from [facility] for management of acute pancreatitis in the setting of potential choledocholithiasis.  Patient was also found to have acute biliary pancreatitis.  GI was initially consulted but MRCP did not show choledocholithiasis.  Patient underwent laparoscopic cholecystectomy on 02/26/2021.  Cardiology was consulted for preop risk stratification.    During hospitalization, post -operatively patient continued to have abdominal pain and distention with renal failure.  He was noted to have duodenal perforation and biliary leak so underwent relaparotomy on 03/01/2021.  After relaparotomy patient had briefly had an NG tube placed in.  Upper GI study was performed on 03/06/2021.  Patient did have a spikes of fever so CT scan of the abdomen chest has been ordered.  Patient has remained on TPN.   Assessment/Plan:    Principal Problem:   Acute pancreatitis Active Problems:   Severe sepsis (HCC)   Cholangitis   Abdominal pain   Nausea   Hypertension   GERD (gastroesophageal reflux disease)   Elevated troponin  Acute biliary pancreatitis:    Patient underwent laparoscopic cholecystectomy by general surgery on 02/26/2021.  Postoperatively, patient continued to have increasing pain and distention and renal failure.  CT scan of the abdomen was performed which showed signs of biliary leak.  Patient was then taken back to the OR on 03/01/2021 and was noted to have a duodenal perforation.  He underwent laparotomy with primary repair of duodenal perforation with Phillip Heal patch by Dr. Zenia Resides on 03/01/21.  Patient underwent upper GI study on 03/06/2021 without note of extravasation.  Contrast flow was demonstrated to the colon..  Continue IV Zosyn due to  duodenal perforation.  Continue TPN for nutrition.  Defer initiation of diet to general surgery  Fever.  ?  If related to atelectasis.  CT of the chest abdomen pelvis did not show any evidence of PE or new infection and abdomen.  Initial cultures on admission were positive for E. coli, repeat cultures have not shown any growth  Duodenal perforation, biliary leak.  Status post exploratory laparotomy with repair of duodenal perforation on 03/01/2021.    Continue IV Zosyn due to biliary leak.  Currently patient is n.p.o.  And on TPN.  Mild leukocytosis.  We will continue to monitor.  CBC Latest Ref Rng & Units 03/08/2021 03/07/2021 03/06/2021  WBC 4.0 - 10.5 K/uL 14.3(H) 13.2(H) 13.5(H)  Hemoglobin 13.0 - 17.0 g/dL 9.4(L) 9.5(L) 10.2(L)  Hematocrit 39.0 - 52.0 % 28.9(L) 29.4(L) 31.0(L)  Platelets 150 - 400 K/uL 220 215 207    Mild shortness of breath, hypoxia.   Improved.  Continue incentive spirometry, deep breathing exercises due to basal atelectasis.  Liver cyst and cirrhosis during surgical intervention.  Will need outpatient PCP and GI follow-up.    Severe sepsis/enterobacter and E. coli bacteremia secondary to ascending cholangitis, gangrenous cholecystitis Currently on IV Zosyn, secondary to duodenal perforation.  Patient initially met sepsis criteria on admission.     CT of the chest, abdomen and pelvis was negative for pulmonary embolism, also showed multiple fluid loculations in the ventral abdomen related to possible seromas versus abscess.  He has not had any recurrence of fever in last 24 hours.  Continue to monitor.   Elevated troponin with LV systolic dysfunction, chronic combined congestive  heart failure. EKG did not show any ischemic changes.  Possibly secondary to demand ischemia . 2D echocardiogram with left ventricular ejection fraction of 40 to 45% with diffuse hypokinesis and abnormal septal motion wall.  Cardiology followed the patient during hospitalization.  Aspirin on hold due  to recent postop status.  Will need outpatient follow-up with cardiology.  Was on Coreg, has been changed to metoprolol IV.    Mild Dilation of acending aorta Follow-up with vascular surgery as outpatient.    PSVT- 2D echo with EF of 45 to 50%.  On IV metoprolol at this time.  Was on Coreg.  Will need repeat TSH follow-up as outpatient.   Acute kidney injury Improved.  Latest creatinine of 0.9   Essential hypertension:  Not on antihypertensives at home.  Was on Coreg has been changed to metoprolol IV.  IV hydralazine has been added for better control.  Continue to monitor.     GERD: Continue IV PPI   Debility, weakness.  Physical therapy has seen the patient and recommend home health PT on discharge.  Encouraged ambulation.    Hypophosphatemia.   Resolved after replacement  Anasarca -Patient noted to have significant hypoalbuminemia -Suspect this is contributing to his anasarca as well as bilateral pleural effusions -Start on albumin infusions as well as intravenous Lasix   DVT prophylaxis: Place TED hose Start: 03/03/21 1742 enoxaparin (LOVENOX) injection 40 mg Start: 03/03/21 1000 Place and maintain sequential compression device Start: 02/23/21 1520 SCDs Start: 02/22/21 1225   Code Status: Full code  Family Communication:  I again spoke with the patient's son at bedside.    Status is: Inpatient  Remains inpatient appropriate because: IV treatments appropriate due to intensity of illness or inability to take PO and Inpatient level of care appropriate due to severity of illness, status post exploratory laparotomy, parenteral nutrition, IV antibiotics.  Dispo: The patient is from: Home              Anticipated d/c is to: Home with home health              Patient currently is not medically stable to d/c.   Difficult to place patient No  Consultants:  GI  Cardiology  General surgery  Procedures:  Laparoscopic cholecystectomy 02/26/2021  Exploratory laparotomy  with primary repair of duodenal perforation with Phillip Heal patch on 03/01/2021  NG tube placement and removal.  Right upper extremity PICC placement on 03/02/2021  Anti-infectives:  Marland Kitchen Ceftriaxone and metronidazole IV 4/27>5/4 . Zosyn 03/01/2021>  Anti-infectives (From admission, onward)   Start     Dose/Rate Route Frequency Ordered Stop   03/01/21 1800  piperacillin-tazobactam (ZOSYN) IVPB 3.375 g        3.375 g 12.5 mL/hr over 240 Minutes Intravenous Every 8 hours 03/01/21 1732     02/26/21 1500  metroNIDAZOLE (FLAGYL) IVPB 500 mg  Status:  Discontinued        500 mg 100 mL/hr over 60 Minutes Intravenous Every 8 hours 02/26/21 1414 03/01/21 1732   02/22/21 2000  cefTRIAXone (ROCEPHIN) 2 g in sodium chloride 0.9 % 100 mL IVPB  Status:  Discontinued        2 g 200 mL/hr over 30 Minutes Intravenous Daily at 10 pm 02/22/21 1339 03/01/21 1716   02/22/21 1600  metroNIDAZOLE (FLAGYL) IVPB 500 mg  Status:  Discontinued        500 mg 100 mL/hr over 60 Minutes Intravenous Every 8 hours 02/22/21 1339 02/26/21 1414  Subjective: Patient's family is at the bedside and helps with translation.  Patient was able to ambulate.  Family reports that he was fatigued and short of breath on ambulation.  Continues to have soreness in abdomen. Objective: Vitals:   03/08/21 0800 03/08/21 1150  BP: (!) 154/72 (!) 158/73  Pulse: 98 98  Resp: (!) 28 (!) 22  Temp:  (!) 97.1 F (36.2 C)  SpO2: 93% 96%    Intake/Output Summary (Last 24 hours) at 03/08/2021 2001 Last data filed at 03/08/2021 1500 Gross per 24 hour  Intake 2050.09 ml  Output 910 ml  Net 1140.09 ml   Filed Weights   03/03/21 0347 03/07/21 0420 03/08/21 0349  Weight: 71.9 kg 72.7 kg 67.4 kg   Body mass index is 23.27 kg/m.   Physical Exam: General exam: Alert, awake, no distress Respiratory system: Clear to auscultation. Respiratory effort normal. Cardiovascular system:RRR. No murmurs, rubs, gallops. Gastrointestinal system: Abdomen is  nondistended, soft. No organomegaly or masses felt. Normal bowel sounds heard.  Abdominal incisions consistent with recent surgeries Central nervous system: Alert and oriented. No focal neurological deficits. Extremities: 1-2+ edema bilaterally Skin: No rashes, lesions or ulcers Psychiatry: Judgement and insight appear normal. Mood & affect appropriate.     Data Review: I have personally reviewed the following laboratory data and studies,  CBC: Recent Labs  Lab 03/03/21 0412 03/04/21 0300 03/05/21 0435 03/06/21 0300 03/07/21 0740 03/08/21 0339  WBC 20.5* 13.5* 15.1* 13.5* 13.2* 14.3*  NEUTROABS 18.0*  --   --  11.3*  --   --   HGB 10.8* 10.1* 10.6* 10.2* 9.5* 9.4*  HCT 32.3* 30.1* 31.7* 31.0* 29.4* 28.9*  MCV 88.5 90.9 88.8 89.1 93.6 90.6  PLT 266 221 235 207 215 889   Basic Metabolic Panel: Recent Labs  Lab 03/03/21 0412 03/04/21 0410 03/05/21 0435 03/06/21 0300 03/07/21 1213 03/08/21 0339  NA 140 139 137 134* 137 139  K 4.0 3.9 3.9 3.9 3.9 3.9  CL 110 106 104 102 105 108  CO2 $Re'22 28 29 27 28 26  'pRs$ GLUCOSE 104* 120* 155* 141* 136* 119*  BUN 34* 26* 23 24* 26* 27*  CREATININE 1.16 1.00 1.00 0.93 0.98 0.96  CALCIUM 8.0* 7.6* 7.4* 7.3* 7.5* 7.5*  MG 2.0 1.7 1.8 2.0 2.1  --   PHOS 2.0* 2.9 2.3* 3.2 3.0  --    Liver Function Tests: Recent Labs  Lab 03/04/21 0410 03/05/21 0435 03/06/21 0300 03/07/21 1213 03/08/21 0339  AST $Re'21 21 19 29 26  'bti$ ALT $R'13 12 13 18 19  'xG$ ALKPHOS 124 106 111 100 90  BILITOT 0.8 0.5 0.4 0.2* 0.7  PROT 4.7* 4.8* 4.8* 5.1* 5.0*  ALBUMIN 1.8* 1.7* 1.6* 1.7* 1.6*   No results for input(s): LIPASE, AMYLASE in the last 168 hours. No results for input(s): AMMONIA in the last 168 hours. Cardiac Enzymes: No results for input(s): CKTOTAL, CKMB, CKMBINDEX, TROPONINI in the last 168 hours. BNP (last 3 results) No results for input(s): BNP in the last 8760 hours.  ProBNP (last 3 results) No results for input(s): PROBNP in the last 8760  hours.  CBG: Recent Labs  Lab 03/07/21 2344 03/08/21 0343 03/08/21 0756 03/08/21 1148 03/08/21 1555  GLUCAP 149* 129* 154* 148* 141*   Recent Results (from the past 240 hour(s))  Culture, blood (Routine X 2) w Reflex to ID Panel     Status: None (Preliminary result)   Collection Time: 03/05/21  5:30 PM   Specimen: BLOOD  Result  Value Ref Range Status   Specimen Description BLOOD LEFT ANTECUBITAL  Final   Special Requests   Final    BOTTLES DRAWN AEROBIC ONLY Blood Culture adequate volume   Culture   Final    NO GROWTH 3 DAYS Performed at [facility], [address] [place], [place] [address]    Report Status PENDING  Incomplete  Culture, blood (Routine X 2) w Reflex to ID Panel     Status: None (Preliminary result)   Collection Time: 03/05/21  5:36 PM   Specimen: BLOOD LEFT WRIST  Result Value Ref Range Status   Specimen Description BLOOD LEFT WRIST  Final   Special Requests   Final    BOTTLES DRAWN AEROBIC ONLY Blood Culture adequate volume   Culture   Final    NO GROWTH 3 DAYS Performed at [facility], [place] [address], [place], [place] [address]    Report Status PENDING  Incomplete     Studies: CT ANGIO CHEST PE W OR WO CONTRAST  Result Date: 03/07/2021 CLINICAL DATA:  Abdominal pain, fever, postoperative EXAM: CT ANGIOGRAPHY CHEST WITH CONTRAST TECHNIQUE: Multidetector CT imaging of the chest was performed using the standard protocol during bolus administration of intravenous contrast. Multiplanar CT image reconstructions and MIPs were obtained to evaluate the vascular anatomy. CONTRAST:  129mL OMNIPAQUE IOHEXOL 350 MG/ML SOLN COMPARISON:  03/01/2021 FINDINGS: Cardiovascular: Satisfactory opacification of the pulmonary arteries to the segmental level. No evidence of pulmonary embolism. Normal heart size. Left coronary artery calcifications. No pericardial effusion. Severe aortic atherosclerosis. Mediastinum/Nodes: No enlarged mediastinal, hilar, or axillary  lymph nodes. Thyroid gland, trachea, and esophagus demonstrate no significant findings. Lungs/Pleura: Moderate bilateral pleural effusions and associated atelectasis or consolidation. Upper Abdomen: Please see separately dictated examination of the abdomen and pelvis. Musculoskeletal: No chest wall abnormality. No acute or significant osseous findings. Review of the MIP images confirms the above findings. IMPRESSION: 1. Negative examination for pulmonary embolism. 2. Moderate bilateral pleural effusions and associated atelectasis or consolidation. 3. Coronary artery disease. Aortic Atherosclerosis (ICD10-I70.0). Electronically Signed   By: Eddie Candle M.D.   On: 03/07/2021 12:54   CT ABDOMEN PELVIS W CONTRAST  Result Date: 03/07/2021 CLINICAL DATA:  Abdominal pain, fever, postoperative, status post recent cholecystectomy and duodenal perforation repair EXAM: CT ABDOMEN AND PELVIS WITH CONTRAST TECHNIQUE: Multidetector CT imaging of the abdomen and pelvis was performed using the standard protocol following bolus administration of intravenous contrast. CONTRAST:  170mL OMNIPAQUE IOHEXOL 350 MG/ML SOLN COMPARISON:  03/01/2021 FINDINGS: Lower chest: Please see separately dictated CT examination of the chest. Hepatobiliary: No solid liver abnormality is seen. Numerous cysts throughout the liver parenchyma. Status post cholecystectomy. No biliary ductal dilatation. Surgical drain is present in the gallbladder fossa. No fluid collection identified. Pancreas: Unremarkable. No pancreatic ductal dilatation or surrounding inflammatory changes. Spleen: Normal in size without significant abnormality. Adrenals/Urinary Tract: Adrenal glands are unremarkable. Kidneys are normal, without renal calculi, solid lesion, or hydronephrosis. Bladder is unremarkable. Stomach/Bowel: Stomach is within normal limits. Appendix appears normal. No evidence of bowel wall thickening, distention, or inflammatory changes. Vascular/Lymphatic:  Aortic atherosclerosis. 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 No enlarged abdominal or pelvic lymph nodes. Reproductive: No mass or other significant abnormality. Other: Interval midline laparotomy. Interval decrease in attenuation of a  rim enhancing fluid collection overlying the right abdominal wall musculature, measuring 14.1 x 9.2 x 2.1 cm (series 7, image 67). There are multiple fluid loculations in the ventral abdomen about the gastrohepatic ligament, porta hepatis, and right hemicolon, the largest located centrally measuring approximately 5.1 x 3.7 cm (series 7, image 38). Small volume free ascites throughout the abdomen and pelvis. Musculoskeletal: No acute or significant osseous findings. IMPRESSION: 1. Interval midline laparotomy. 2. Surgical drain is present in the gallbladder fossa. No fluid collection identified in the gallbladder fossa. 3. There are multiple fluid loculations in the ventral abdomen about the gastrohepatic ligament, porta hepatis, and right hemicolon, the largest located centrally measuring approximately 5.1 x 3.7 cm. These may reflect seroma or abscess in the postoperative setting. The presence or absence of infection is not established by CT. 4. Small volume free ascites throughout the abdomen and pelvis. 5. 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 Recommend nonemergent vascular consultation if not already obtained and if clinically appropriate. This recommendation follows ACR consensus guidelines: White Paper of the ACR Incidental Findings Committee II on Vascular  Findings. J Am Coll Radiol 2013; 10:789-794. Aortic Atherosclerosis (ICD10-I70.0). Electronically Signed   By: Eddie Candle M.D.   On: 03/07/2021 13:10   VAS [place] LOWER EXTREMITY VENOUS (DVT)  Result Date: 03/07/2021  Lower Venous DVT Study Patient Name:  Travis Gallagher [facility]  Date of Exam:   03/07/2021 Medical Rec #: [id number]                  Accession #:    [id number] Date of Birth: 09-21-1931                  Patient Gender: M Patient Age:   89Y Exam Location:  [facility] Procedure:      VAS [place] LOWER EXTREMITY VENOUS (DVT) Referring Phys: [id number] MICHAEL M MACZIS --------------------------------------------------------------------------------  Indications: Swelling. Other Indications: Acute pancreatitis with swelling of abdomen and pelvis. Comparison Study: No previous exams Performing Technologist: Jody Hill RVT, RDMS  Examination Guidelines: A complete evaluation includes B-mode imaging, spectral Doppler, color Doppler, and power Doppler as needed of all accessible portions of each vessel. Bilateral testing is considered an integral part of a complete examination. Limited examinations for reoccurring indications may be performed as noted. The reflux portion of the exam is performed with the patient in reverse Trendelenburg.  +---------+---------------+---------+-----------+----------+--------------+ RIGHT    CompressibilityPhasicitySpontaneityPropertiesThrombus Aging +---------+---------------+---------+-----------+----------+--------------+ CFV      Full           Yes      Yes                                 +---------+---------------+---------+-----------+----------+--------------+ SFJ      Full                                                        +---------+---------------+---------+-----------+----------+--------------+ FV Prox  Full           Yes      Yes                                 +---------+---------------+---------+-----------+----------+--------------+  FV Mid   Full           Yes      Yes                                 +---------+---------------+---------+-----------+----------+--------------+ FV DistalFull           Yes      Yes                                 +---------+---------------+---------+-----------+----------+--------------+ PFV      Full                                                        +---------+---------------+---------+-----------+----------+--------------+ POP      Full           Yes      Yes                                 +---------+---------------+---------+-----------+----------+--------------+ PTV      Full                                                        +---------+---------------+---------+-----------+----------+--------------+ PERO     Full                                                        +---------+---------------+---------+-----------+----------+--------------+   +---------+---------------+---------+-----------+----------+--------------+ LEFT     CompressibilityPhasicitySpontaneityPropertiesThrombus Aging +---------+---------------+---------+-----------+----------+--------------+ CFV      Full           Yes      Yes                                 +---------+---------------+---------+-----------+----------+--------------+ SFJ      Full                                                        +---------+---------------+---------+-----------+----------+--------------+ FV Prox  Full           Yes      Yes                                 +---------+---------------+---------+-----------+----------+--------------+ FV Mid   Full           Yes      Yes                                 +---------+---------------+---------+-----------+----------+--------------+ FV DistalFull  Yes      Yes                                 +---------+---------------+---------+-----------+----------+--------------+ PFV      Full                                                         +---------+---------------+---------+-----------+----------+--------------+ POP      Full           Yes      Yes                                 +---------+---------------+---------+-----------+----------+--------------+ PTV      Full                                                        +---------+---------------+---------+-----------+----------+--------------+ PERO     Full                                                        +---------+---------------+---------+-----------+----------+--------------+     Summary: BILATERAL: - No evidence of deep vein thrombosis seen in the lower extremities, bilaterally. - No evidence of superficial venous thrombosis in the lower extremities, bilaterally. -No evidence of popliteal cyst, bilaterally.   *See table(s) above for measurements and observations. Electronically signed by Curt Jews MD on 03/07/2021 at 3:48:52 PM.    Final      Kathie Dike, MD  [facility] 03/08/2021  If 7PM-7AM, please contact night-coverage

## 2021-03-08 NOTE — Progress Notes (Signed)
Pt discharged from unit. Medication/discharge instruction given  Marylyn Appenzeller K Kathlene Yano, RN  

## 2021-03-08 NOTE — Progress Notes (Signed)
Patient resting in bed and had a short run Of At. Fib rate 126 then back to S.R. cont. To monitor patient and rhythm.

## 2021-03-08 NOTE — Progress Notes (Signed)
7 Days Post-Op   Subjective/Chief Complaint: Complains of pain and bloating   Objective: Vital signs in last 24 hours: Temp:  [98.8 F (37.1 C)-100.4 F (38 C)] 98.8 F (37.1 C) (05/11 0759) Pulse Rate:  [96-105] 99 (05/11 0759) Resp:  [26-29] 28 (05/11 0759) BP: (136-160)/(65-74) 157/70 (05/11 0759) SpO2:  [92 %-95 %] 94 % (05/11 0759) Weight:  [67.4 kg] 67.4 kg (05/11 0349) Last BM Date: 03/07/21  Intake/Output from previous day: 05/10 0701 - 05/11 0700 In: 1170 [P.O.:120; I.V.:1000; IV Piggyback:50] Out: 610 [Urine:600; Drains:10] Intake/Output this shift: No intake/output data recorded.  General appearance: alert and cooperative Resp: clear to auscultation bilaterally Cardio: regular rate and rhythm GI: soft, distended. tender in RUQ  Lab Results:  Recent Labs    03/07/21 0740 03/08/21 0339  WBC 13.2* 14.3*  HGB 9.5* 9.4*  HCT 29.4* 28.9*  PLT 215 220   BMET Recent Labs    03/07/21 1213 03/08/21 0339  NA 137 139  K 3.9 3.9  CL 105 108  CO2 28 26  GLUCOSE 136* 119*  BUN 26* 27*  CREATININE 0.98 0.96  CALCIUM 7.5* 7.5*   PT/INR No results for input(s): LABPROT, INR in the last 72 hours. ABG No results for input(s): PHART, HCO3 in the last 72 hours.  Invalid input(s): PCO2, PO2  Studies/Results: CT ANGIO CHEST PE W OR WO CONTRAST  Result Date: 03/07/2021 CLINICAL DATA:  Abdominal pain, fever, postoperative EXAM: CT ANGIOGRAPHY CHEST WITH CONTRAST TECHNIQUE: Multidetector CT imaging of the chest was performed using the standard protocol during bolus administration of intravenous contrast. Multiplanar CT image reconstructions and MIPs were obtained to evaluate the vascular anatomy. CONTRAST:  OMNIPAQUE IOHEXOL 350 MG/ML SOLN COMPARISON:  03/01/2021 FINDINGS: Cardiovascular: Satisfactory opacification of the pulmonary arteries to the segmental level. No evidence of pulmonary embolism. Normal heart size. Left coronary artery calcifications. No  pericardial effusion. Severe aortic atherosclerosis. Mediastinum/Nodes: No enlarged mediastinal, hilar, or axillary lymph nodes. Thyroid gland, trachea, and esophagus demonstrate no significant findings. Lungs/Pleura: Moderate bilateral pleural effusions and associated atelectasis or consolidation. Upper Abdomen: Please see separately dictated examination of the abdomen and pelvis. Musculoskeletal: No chest wall abnormality. No acute or significant osseous findings. Review of the MIP images confirms the above findings. IMPRESSION: 1. Negative examination for pulmonary embolism. 2. Moderate bilateral pleural effusions and associated atelectasis or consolidation. 3. Coronary artery disease. Aortic Atherosclerosis (ICD10-I70.0). Electronically Signed   By: Lauralyn Primes M.D.   On: 03/07/2021 12:54   CT ABDOMEN PELVIS W CONTRAST  Result Date: 03/07/2021 CLINICAL DATA:  Abdominal pain, fever, postoperative, status post recent cholecystectomy and duodenal perforation repair EXAM: CT ABDOMEN AND PELVIS WITH CONTRAST TECHNIQUE: Multidetector CT imaging of the abdomen and pelvis was performed using the standard protocol following bolus administration of intravenous contrast. CONTRAST:  OMNIPAQUE IOHEXOL 350 MG/ML SOLN COMPARISON:  03/01/2021 FINDINGS: Lower chest: Please see separately dictated CT examination of the chest. Hepatobiliary: No solid liver abnormality is seen. Numerous cysts throughout the liver parenchyma. Status post cholecystectomy. No biliary ductal dilatation. Surgical drain is present in the gallbladder fossa. No fluid collection identified. Pancreas: Unremarkable. No pancreatic ductal dilatation or surrounding inflammatory changes. Spleen: Normal in size without significant abnormality. Adrenals/Urinary Tract: Adrenal glands are unremarkable. Kidneys are normal, without renal calculi, solid lesion, or hydronephrosis. Bladder is unremarkable. Stomach/Bowel: Stomach is within normal limits.  Appendix appears normal. No evidence of bowel wall thickening, distention, or inflammatory changes. Vascular/Lymphatic: Aortic atherosclerosis. 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 No enlarged abdominal or pelvic lymph nodes. Reproductive: No mass or other significant abnormality. Other: Interval midline laparotomy. Interval decrease in attenuation of a rim enhancing fluid collection overlying the right abdominal wall musculature, measuring 14.1 x 9.2 x 2.1 cm (series 7, image 67). There are multiple fluid loculations in the ventral abdomen about the gastrohepatic ligament, porta hepatis, and right hemicolon, the largest located centrally measuring approximately 5.1 x 3.7 cm (series 7, image 38). Small volume free ascites throughout the abdomen and pelvis. Musculoskeletal: No acute or significant osseous findings. IMPRESSION: 1. Interval midline laparotomy. 2. Surgical drain is present in the gallbladder fossa. No fluid collection identified in the gallbladder fossa. 3. There are multiple fluid loculations in the ventral abdomen about the gastrohepatic ligament, porta hepatis, and right hemicolon, the largest located centrally measuring approximately 5.1 x 3.7 cm. These may reflect seroma or abscess in the postoperative setting. The presence or absence of infection is not established by CT. 4. Small volume free ascites throughout the abdomen and pelvis. 5. 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 Recommend nonemergent vascular consultation if not already obtained and if clinically appropriate.  This recommendation follows ACR consensus guidelines: White Paper of the ACR Incidental Findings Committee II on Vascular Findings. J Am Coll Radiol 2013; 10:789-794. Aortic Atherosclerosis (ICD10-I70.0). Electronically Signed   By: Lauralyn PrimesAlex  Bibbey M.D.   On: 03/07/2021 13:10   DG Abd Portable 1V  Result Date: 03/06/2021 CLINICAL DATA:  Abdominal distension EXAM: PORTABLE ABDOMEN - 1 VIEW COMPARISON:  Films from recent upper GI FINDINGS: Scattered large and small bowel gas is noted. Previously administered contrast from upper GI now lies within the colon. The appendix is within normal limits. Postsurgical changes with a right upper quadrant drain are noted. No obstructive changes are seen. No definitive free air is identified. IMPRESSION: Previously administered contrast now lies throughout the colon. No obstructive changes are seen. Postsurgical changes with right upper quadrant drain are noted. No acute abnormality is seen. Electronically Signed   By: Alcide CleverMark  Lukens M.D.   On: 03/06/2021 16:55   VAS US LOWER EXTREMITY VENOUS (DVT)  Result Date: 03/07/2021  Lower Venous DVT Study Patient Name:  [facility]CIAL [facility]  Date of Exam:   03/07/2021 Medical Rec #: [id number]                  Accession #:    [id number] Date of Birth: 05-24-1931                  Patient Gender: M Patient Age:   89Y [facility] Procedure:      VAS US LOWER EXTREMITY VENOUS (DVT) Referring Phys: [id number] MICHAEL M MACZIS --------------------------------------------------------------------------------  Indications: Swelling. Other Indications: Acute pancreatitis with swelling of abdomen and pelvis. Comparison Study: No previous exams Performing Technologist: Jody Hill RVT, RDMS  Examination Guidelines: A complete evaluation includes B-mode imaging, spectral Doppler, color Doppler, and power Doppler as needed of all accessible portions of each vessel. Bilateral testing is considered an integral part of a complete  examination. Limited examinations for reoccurring indications may be performed  as noted. The reflux portion of the exam is performed with the patient in reverse Trendelenburg.  +---------+---------------+---------+-----------+----------+--------------+ RIGHT    CompressibilityPhasicitySpontaneityPropertiesThrombus Aging +---------+---------------+---------+-----------+----------+--------------+ CFV      Full           Yes      Yes                                 +---------+---------------+---------+-----------+----------+--------------+ SFJ      Full                                                        +---------+---------------+---------+-----------+----------+--------------+ FV Prox  Full           Yes      Yes                                 +---------+---------------+---------+-----------+----------+--------------+ FV Mid   Full           Yes      Yes                                 +---------+---------------+---------+-----------+----------+--------------+ FV DistalFull           Yes      Yes                                 +---------+---------------+---------+-----------+----------+--------------+ PFV      Full                                                        +---------+---------------+---------+-----------+----------+--------------+ POP      Full           Yes      Yes                                 +---------+---------------+---------+-----------+----------+--------------+ PTV      Full                                                        +---------+---------------+---------+-----------+----------+--------------+ PERO     Full                                                        +---------+---------------+---------+-----------+----------+--------------+   +---------+---------------+---------+-----------+----------+--------------+ LEFT     CompressibilityPhasicitySpontaneityPropertiesThrombus Aging  +---------+---------------+---------+-----------+----------+--------------+ CFV      Full           Yes      Yes                                 +---------+---------------+---------+-----------+----------+--------------+  SFJ      Full                                                        +---------+---------------+---------+-----------+----------+--------------+ FV Prox  Full           Yes      Yes                                 +---------+---------------+---------+-----------+----------+--------------+ FV Mid   Full           Yes      Yes                                 +---------+---------------+---------+-----------+----------+--------------+ FV DistalFull           Yes      Yes                                 +---------+---------------+---------+-----------+----------+--------------+ PFV      Full                                                        +---------+---------------+---------+-----------+----------+--------------+ POP      Full           Yes      Yes                                 +---------+---------------+---------+-----------+----------+--------------+ PTV      Full                                                        +---------+---------------+---------+-----------+----------+--------------+ PERO     Full                                                        +---------+---------------+---------+-----------+----------+--------------+     Summary: BILATERAL: - No evidence of deep vein thrombosis seen in the lower extremities, bilaterally. - No evidence of superficial venous thrombosis in the lower extremities, bilaterally. -No evidence of popliteal cyst, bilaterally.   *See table(s) above for measurements and observations. Electronically signed by Gretta Began MD on 03/07/2021 at 3:48:52 PM.    Final     Anti-infectives: Anti-infectives (From admission, onward)   Start     Dose/Rate Route Frequency Ordered Stop   03/01/21 1800   piperacillin-tazobactam (ZOSYN) IVPB 3.375 g        3.375 g 12.5 mL/hr over 240 Minutes Intravenous Every 8 hours 03/01/21 1732     02/26/21 1500  metroNIDAZOLE (FLAGYL) IVPB 500 mg  Status:  Discontinued  500 mg 100 mL/hr over 60 Minutes Intravenous Every 8 hours 02/26/21 1414 03/01/21 1732   02/22/21 2000  cefTRIAXone (ROCEPHIN) 2 g in sodium chloride 0.9 % 100 mL IVPB  Status:  Discontinued        2 g 200 mL/hr over 30 Minutes Intravenous Daily at 10 pm 02/22/21 1339 03/01/21 1716   02/22/21 1600  metroNIDAZOLE (FLAGYL) IVPB 500 mg  Status:  Discontinued        500 mg 100 mL/hr over 60 Minutes Intravenous Every 8 hours 02/22/21 1339 02/26/21 1414      Assessment/Plan: s/p Procedure(s): EXPLORATORY LAPAROTOMY - PRIMARY REPAIR OF DUODENAL PERFORATION WITH GRAHAM PATCH;  INSERTION OF 19 FR. DRAIN (N/A) Continue drain and abx  Elevated troponin- 32 >174 >112, EKG without ischemia changes,cards cleared for surgery E.coli, Enterobacterales bacteremia - on IV abx. Repeat Cx's with NGTD Hypoxia - off o2 this morning. Received lasix yesterday. Pulm toilet   Acute biliary pancreatitis, suspect resolving ascending cholangitis, gangrenous cholecystitis  s/p lap chole with Dr. Dwain Sarna on 5/1 - POD#9 - operative findings of liver cysts and cirrhosis- will need PCP/GI follow up - Path: Acute and chronic cholecystitis with cholelithiasis  Duodenal perforation s/p exp lap with primary repair of duodenal perforation with [name] patch - Dr. Freida Busman 5/4 POD#6 -Febrile again overnight.WBC20 > 13 > 15.1>13.5 > 13.2 >14.3 Trend - UGI negative. Follow up xray with contrast that passed into colon. Having bowel function - BID WTD for midline wound. Top 8 staples removed from midline 5/10 and hematoma was evacuated. Continue dressing changes - PT/OT - recommending home with HH -Continue JP and monitor - TPN - Cont IV abx  FEN:NPO, TPN. Follow prealb. abd wall looks like  anasarca ID: ceftriaxone 4/27>5/3, flagyl 4/26>5/4, zosyn 5/4>> Foley: Removed5/6., Voiding. VTE: SCDs,Lovenox  LOS: 14 days    Chevis Pretty III 03/08/2021

## 2021-03-09 LAB — COMPREHENSIVE METABOLIC PANEL
ALT: 24 U/L (ref 0–44)
AST: 31 U/L (ref 15–41)
Albumin: 2.1 g/dL — ABNORMAL LOW (ref 3.5–5.0)
Alkaline Phosphatase: 85 U/L (ref 38–126)
Anion gap: 4 — ABNORMAL LOW (ref 5–15)
BUN: 29 mg/dL — ABNORMAL HIGH (ref 8–23)
CO2: 25 mmol/L (ref 22–32)
Calcium: 7.8 mg/dL — ABNORMAL LOW (ref 8.9–10.3)
Chloride: 109 mmol/L (ref 98–111)
Creatinine, Ser: 1.07 mg/dL (ref 0.61–1.24)
GFR, Estimated: >60 mL/min (ref >60)
Glucose, Bld: 126 mg/dL — ABNORMAL HIGH (ref 70–99)
Potassium: 3.8 mmol/L (ref 3.5–5.1)
Sodium: 138 mmol/L (ref 135–145)
Total Bilirubin: 0.6 mg/dL (ref 0.3–1.2)
Total Protein: 5.5 g/dL — ABNORMAL LOW (ref 6.5–8.1)

## 2021-03-09 LAB — CBC
HCT: 31.4 % — ABNORMAL LOW (ref 39.0–52.0)
Hemoglobin: 9.9 g/dL — ABNORMAL LOW (ref 13.0–17.0)
MCH: 29 pg (ref 26.0–34.0)
MCHC: 31.5 g/dL (ref 30.0–36.0)
MCV: 92.1 fL (ref 80.0–100.0)
Platelets: 226 10*3/uL (ref 150–400)
RBC: 3.41 MIL/uL — ABNORMAL LOW (ref 4.22–5.81)
RDW: 13.2 % (ref 11.5–15.5)
WBC: 14.8 10*3/uL — ABNORMAL HIGH (ref 4.0–10.5)
nRBC: 0 % (ref 0.0–0.2)

## 2021-03-09 LAB — MAGNESIUM: Magnesium: 2.2 mg/dL (ref 1.7–2.4)

## 2021-03-09 LAB — GLUCOSE, CAPILLARY
Glucose-Capillary: 136 mg/dL — ABNORMAL HIGH (ref 70–99)
Glucose-Capillary: 130 mg/dL — ABNORMAL HIGH (ref 70–99)
Glucose-Capillary: 141 mg/dL — ABNORMAL HIGH (ref 70–99)

## 2021-03-09 LAB — PHOSPHORUS: Phosphorus: 3.3 mg/dL (ref 2.5–4.6)

## 2021-03-09 MED ORDER — ALBUMIN HUMAN 25 % IV SOLN
25.0000 g | Freq: Once | INTRAVENOUS | Status: AC
  Administered 2021-03-09: 25 g via INTRAVENOUS
  Filled 2021-03-09: qty 100

## 2021-03-09 MED ORDER — POTASSIUM CHLORIDE 10 MEQ/50ML IV SOLN
10.0000 meq | INTRAVENOUS | Status: AC
  Administered 2021-03-09 (×4): 10 meq via INTRAVENOUS
  Filled 2021-03-09 (×4): qty 50

## 2021-03-09 MED ORDER — BOOST / RESOURCE BREEZE PO LIQD CUSTOM
1.0000 | Freq: Three times a day (TID) | ORAL | Status: DC
  Administered 2021-03-10: 1 via ORAL

## 2021-03-09 MED ORDER — TRAVASOL 10 % IV SOLN
INTRAVENOUS | Status: AC
  Filled 2021-03-09: qty 940.8

## 2021-03-09 NOTE — Progress Notes (Signed)
PROGRESS NOTE  Travis Gallagher OHY:[id number] DOB: [birth year] DOA: 02/22/2021 PCP: Pcp, No   LOS: 15 days   Brief narrative:  Travis Gallagher is a 87 y.o. male with medical history significant for hypertension, GERD was admitted to hospital as transfer from [facility] for management of acute pancreatitis in the setting of potential choledocholithiasis.  Patient was also found to have acute biliary pancreatitis.  GI was initially consulted but MRCP did not show choledocholithiasis.  Patient underwent laparoscopic cholecystectomy on 02/26/2021.  Cardiology was consulted for preop risk stratification.    During hospitalization, post -operatively patient continued to have abdominal pain and distention with renal failure.  He was noted to have duodenal perforation and biliary leak so underwent relaparotomy on 03/01/2021.  After relaparotomy patient had briefly had an NG tube placed in.  Upper GI study was performed on 03/06/2021.  Patient did have a spikes of fever so CT scan of the abdomen chest has been ordered.  Patient has remained on TPN.   Assessment/Plan:    Principal Problem:   Acute pancreatitis Active Problems:   Severe sepsis (HCC)   Cholangitis   Abdominal pain   Nausea   Hypertension   GERD (gastroesophageal reflux disease)   Elevated troponin  Acute biliary pancreatitis:    Patient underwent laparoscopic cholecystectomy by general surgery on 02/26/2021.  Postoperatively, patient continued to have increasing pain and distention and renal failure.  CT scan of the abdomen was performed which showed signs of biliary leak.  Patient was then taken back to the OR on 03/01/2021 and was noted to have a duodenal perforation.  He underwent laparotomy with primary repair of duodenal perforation with Phillip Heal patch by Dr. Zenia Resides on 03/01/21.  Patient underwent upper GI study on 03/06/2021 without note of extravasation.  Contrast flow was demonstrated to the colon..  Continue IV Zosyn due to  duodenal perforation.  Continue TPN for nutrition.  Started on clear liquids.  Fever.  ?  If related to atelectasis.  CT of the chest abdomen pelvis did not show any evidence of PE.  Initial blood culture on admission was positive for E. coli, but repeat cultures have not shown any growth. There mention of fluid collection on CT of abdomen near surgical site, may need repeat CT in next few days to monitor for developing abscess  Duodenal perforation, biliary leak.  Status post exploratory laparotomy with repair of duodenal perforation on 03/01/2021.    Continue IV Zosyn due to biliary leak.  Currently patient is n.p.o.  And on TPN.  Mild leukocytosis.  Per surgery, will start on clear liquids today. We will continue to monitor.  CBC Latest Ref Rng & Units 03/09/2021 03/08/2021 03/07/2021  WBC 4.0 - 10.5 K/uL 14.8(H) 14.3(H) 13.2(H)  Hemoglobin 13.0 - 17.0 g/dL 9.9(L) 9.4(L) 9.5(L)  Hematocrit 39.0 - 52.0 % 31.4(L) 28.9(L) 29.4(L)  Platelets 150 - 400 K/uL 226 220 215    Liver cyst and cirrhosis during surgical intervention.  Will need outpatient PCP and GI follow-up.    Severe sepsis/enterobacter and E. coli bacteremia secondary to ascending cholangitis, gangrenous cholecystitis Currently on IV Zosyn, secondary to duodenal perforation.  Patient initially met sepsis criteria on admission. CT of the chest, abdomen and pelvis was negative for pulmonary embolism, also showed multiple fluid loculations in the ventral abdomen related to possible seromas versus abscess.  Tmax of 100 overnight.  Continue to monitor.   Elevated troponin with LV systolic dysfunction, chronic combined congestive heart failure. EKG did not  show any ischemic changes.  Possibly secondary to demand ischemia . 2D echocardiogram with left ventricular ejection fraction of 40 to 45% with diffuse hypokinesis and abnormal septal motion wall.  Cardiology followed the patient during hospitalization.  Aspirin on hold due to recent postop  status.  Will need outpatient follow-up with cardiology.  Was on Coreg, has been changed to metoprolol IV.    Mild Dilation of acending aorta Follow-up with vascular surgery as outpatient.    PSVT- 2D echo with EF of 45 to 50%.  On IV metoprolol at this time.  Was on Coreg.  Will need repeat TSH follow-up as outpatient.   Acute kidney injury Improved.  Latest creatinine of 1.0   Essential hypertension:  Not on antihypertensives at home.  Was on Coreg has been changed to metoprolol IV.  IV hydralazine has been added for better control.  Continue to monitor.     GERD: Continue IV PPI   Debility, weakness.  Physical therapy has seen the patient and recommend home health PT on discharge.  Encouraged ambulation.    Hypophosphatemia.   Resolved after replacement  Anasarca/bilateral pleural effusions -Patient noted to have significant hypoalbuminemia -continue IV lasix -he has had good urine output   DVT prophylaxis: Place TED hose Start: 03/03/21 1742 enoxaparin (LOVENOX) injection 40 mg Start: 03/03/21 1000 Place and maintain sequential compression device Start: 02/23/21 1520 SCDs Start: 02/22/21 1225   Code Status: Full code  Family Communication:  I again spoke with the patient's son at bedside.    Status is: Inpatient  Remains inpatient appropriate because: IV treatments appropriate due to intensity of illness or inability to take PO and Inpatient level of care appropriate due to severity of illness, status post exploratory laparotomy, parenteral nutrition, IV antibiotics.  Dispo: The patient is from: Home              Anticipated d/c is to: Home with home health              Patient currently is not medically stable to d/c.   Difficult to place patient No  Consultants:  GI  Cardiology  General surgery  Procedures:  Laparoscopic cholecystectomy 02/26/2021  Exploratory laparotomy with primary repair of duodenal perforation with Phillip Heal patch on 03/01/2021  NG tube  placement and removal.  Right upper extremity PICC placement on 03/02/2021  Anti-infectives:  Marland Kitchen Ceftriaxone and metronidazole IV 4/27>5/4 . Zosyn 03/01/2021>  Anti-infectives (From admission, onward)   Start     Dose/Rate Route Frequency Ordered Stop   03/01/21 1800  piperacillin-tazobactam (ZOSYN) IVPB 3.375 g        3.375 g 12.5 mL/hr over 240 Minutes Intravenous Every 8 hours 03/01/21 1732     02/26/21 1500  metroNIDAZOLE (FLAGYL) IVPB 500 mg  Status:  Discontinued        500 mg 100 mL/hr over 60 Minutes Intravenous Every 8 hours 02/26/21 1414 03/01/21 1732   02/22/21 2000  cefTRIAXone (ROCEPHIN) 2 g in sodium chloride 0.9 % 100 mL IVPB  Status:  Discontinued        2 g 200 mL/hr over 30 Minutes Intravenous Daily at 10 pm 02/22/21 1339 03/01/21 1716   02/22/21 1600  metroNIDAZOLE (FLAGYL) IVPB 500 mg  Status:  Discontinued        500 mg 100 mL/hr over 60 Minutes Intravenous Every 8 hours 02/22/21 1339 02/26/21 1414     Subjective: Continues to have some abdominal pain. No vomiting. He has not been out of bed  today Objective: Vitals:   03/09/21 0800 03/09/21 1133  BP: (!) 150/89 (!) 165/67  Pulse: 90 95  Resp: 20 20  Temp: 98.2 F (36.8 C) 97.8 F (36.6 C)  SpO2: 97% 96%    Intake/Output Summary (Last 24 hours) at 03/09/2021 1341 Last data filed at 03/09/2021 1243 Gross per 24 hour  Intake 1727.86 ml  Output 3550 ml  Net -1822.14 ml   Filed Weights   03/07/21 0420 03/08/21 0349 03/09/21 0426  Weight: 72.7 kg 67.4 kg 65.7 kg   Body mass index is 22.69 kg/m.   Physical Exam: General exam: Alert, awake, oriented x 3 Respiratory system: Clear to auscultation. Respiratory effort normal. Cardiovascular system:RRR. No murmurs, rubs, gallops. Gastrointestinal system: Abdomen is distended, soft and moderately tender. No organomegaly or masses felt. Hypoactive bowel sounds heard. Midline incision consistent with recent surgery Central nervous system: Alert and oriented. No  focal neurological deficits. Extremities: No C/C/E, +pedal pulses Skin: No rashes, lesions or ulcers Psychiatry: Judgement and insight appear normal. Mood & affect appropriate.      Data Review: I have personally reviewed the following laboratory data and studies,  CBC: Recent Labs  Lab 03/03/21 0412 03/04/21 0300 03/05/21 0435 03/06/21 0300 03/07/21 0740 03/08/21 0339 03/09/21 1054  WBC 20.5*   < > 15.1* 13.5* 13.2* 14.3* 14.8*  NEUTROABS 18.0*  --   --  11.3*  --   --   --   HGB 10.8*   < > 10.6* 10.2* 9.5* 9.4* 9.9*  HCT 32.3*   < > 31.7* 31.0* 29.4* 28.9* 31.4*  MCV 88.5   < > 88.8 89.1 93.6 90.6 92.1  PLT 266   < > 235 207 215 220 226   < > = values in this interval not displayed.   Basic Metabolic Panel: Recent Labs  Lab 03/04/21 0410 03/05/21 0435 03/06/21 0300 03/07/21 1213 03/08/21 0339 03/09/21 0451  NA 139 137 134* 137 139 138  K 3.9 3.9 3.9 3.9 3.9 3.8  CL 106 104 102 105 108 109  CO2 '28 29 27 28 26 25  ' GLUCOSE 120* 155* 141* 136* 119* 126*  BUN 26* 23 24* 26* 27* 29*  CREATININE 1.00 1.00 0.93 0.98 0.96 1.07  CALCIUM 7.6* 7.4* 7.3* 7.5* 7.5* 7.8*  MG 1.7 1.8 2.0 2.1  --  2.2  PHOS 2.9 2.3* 3.2 3.0  --  3.3   Liver Function Tests: Recent Labs  Lab 03/05/21 0435 03/06/21 0300 03/07/21 1213 03/08/21 0339 03/09/21 0451  AST '21 19 29 26 31  ' ALT '12 13 18 19 24  ' ALKPHOS 106 111 100 90 85  BILITOT 0.5 0.4 0.2* 0.7 0.6  PROT 4.8* 4.8* 5.1* 5.0* 5.5*  ALBUMIN 1.7* 1.6* 1.7* 1.6* 2.1*   No results for input(s): LIPASE, AMYLASE in the last 168 hours. No results for input(s): AMMONIA in the last 168 hours. Cardiac Enzymes: No results for input(s): CKTOTAL, CKMB, CKMBINDEX, TROPONINI in the last 168 hours. BNP (last 3 results) No results for input(s): BNP in the last 8760 hours.  ProBNP (last 3 results) No results for input(s): PROBNP in the last 8760 hours.  CBG: Recent Labs  Lab 03/08/21 1148 03/08/21 1555 03/08/21 2115 03/09/21 0618  03/09/21 1237  GLUCAP 148* 141* 130* 136* 130*   Recent Results (from the past 240 hour(s))  Culture, blood (Routine X 2) w Reflex to ID Panel     Status: None (Preliminary result)   Collection Time: 03/05/21  5:30 PM  Specimen: BLOOD  Result Value Ref Range Status   Specimen Description BLOOD LEFT ANTECUBITAL  Final   Special Requests   Final    BOTTLES DRAWN AEROBIC ONLY Blood Culture adequate volume   Culture   Final    NO GROWTH 4 DAYS Performed at [facility], [address], [place], [place] [address]    Report Status PENDING  Incomplete  Culture, blood (Routine X 2) w Reflex to ID Panel     Status: None (Preliminary result)   Collection Time: 03/05/21  5:36 PM   Specimen: BLOOD LEFT WRIST  Result Value Ref Range Status   Specimen Description BLOOD LEFT WRIST  Final   Special Requests   Final    BOTTLES DRAWN AEROBIC ONLY Blood Culture adequate volume   Culture   Final    NO GROWTH 4 DAYS Performed at [facility], [place] [address] [place], [place] [address]    Report Status PENDING  Incomplete     Studies: No results found.   Kathie Dike, MD  [facility] 03/09/2021  If 7PM-7AM, please contact night-coverage

## 2021-03-09 NOTE — Progress Notes (Addendum)
Nutrition Follow Up  DOCUMENTATION CODES:   Severe malnutrition in context of acute illness/injury  INTERVENTION:    Continue TPN to meet 100% of needs until diet is progressed and intake remains consistent  Add Boost Breeze po TID, each supplement provides 250 kcal and 9 grams of protein  NUTRITION DIAGNOSIS:   Severe Malnutrition related to acute illness (acute pancreatitis) as evidenced by severe fat depletion,severe muscle depletion,energy intake < or equal to 50% for > or equal to 5 days.  Diet advanced   GOAL:   Patient will meet greater than or equal to 90% of their needs   Progressing   MONITOR:   PO intake,Supplement acceptance,Weight trends,Labs,I & O's,Diet advancement  REASON FOR ASSESSMENT:   NPO/Clear Liquid Diet   ASSESSMENT:   87 yo male with a PMH of HTN and GERD who presents with acute pancreatitis.   5/02 - lap cholecystectomy for gangrenous cholecystitis 5/04 - brought to the operating room emergently for exploration and duodenal perforation repair 5/05 - NGT placed for onset of abdominal pain, TPN started 5/09- diet advanced to clear liquid  5/10 - evacuated hematoma at top of midline wound  Patient complains of dull abdominal pain at his incision site. Denies nausea/vomiting and having BMs. Diet advanced to clear liquid this am. RD discussed importance of high kcal and high protein meals/supplements. Discussed having small more frequent meals to help with tolerance.   TPN running at goal rate 80 ml/hr to provide 94g protein and 1951 kcal. Continue to meet 100% of needs until diet can progress and intake remains consistent.   Upon repeat NFPE, patient shows severe muscle and fat depletions.    Admission weight: 64 kg  Current weight: 65.7 kg   UOP: 2250 ml x 24 hrs   Medications: 20 mg lasix, SS novolog Labs: CBG 130-154  Flowsheet Row Most Recent Value  Orbital Region Severe depletion  Upper Arm Region Moderate depletion  Thoracic and  Lumbar Region Unable to assess  Buccal Region Severe depletion  Temple Region Severe depletion  Clavicle Bone Region Severe depletion  Clavicle and Acromion Bone Region Severe depletion  Scapular Bone Region Unable to assess  Dorsal Hand Severe depletion  Patellar Region Severe depletion  Anterior Thigh Region Severe depletion  Posterior Calf Region Severe depletion  Edema (RD Assessment) None  Hair Reviewed  Eyes Reviewed  Mouth Reviewed  Skin Reviewed  Nails Reviewed     Diet Order:   Diet Order            Diet clear liquid Room service appropriate? Yes; Fluid consistency: Thin  Diet effective now                EDUCATION NEEDS:   Education needs have been addressed  Skin:  Skin Assessment: Skin Integrity Issues: Skin Integrity Issues:: Incisions Incisions: Abdomen x 5, closed  Last BM:  5/12  Height:  Ht Readings from Last 1 Encounters:  02/21/21 5\' 7"  (1.702 m)   Weight:  Wt Readings from Last 1 Encounters:  03/09/21 65.7 kg   Ideal Body Weight:  67.3 kg  BMI:  Body mass index is 22.69 kg/m.  Estimated Nutritional Needs:   Kcal:  1900-2100   Protein:  90-105 grams   Fluid:  >1.9 L  05/09/21 RD, LDN Clinical Nutrition Pager listed in [facility]

## 2021-03-09 NOTE — Plan of Care (Signed)

## 2021-03-09 NOTE — Progress Notes (Signed)
Physical Therapy Treatment Patient Details Name: Travis Gallagher MRN: [id number] DOB: Dec 06, 1930 Today's Date: 03/09/2021    History of Present Illness 87 y.o. M admitted 02/22/2021 with gallstone pancreatitis, underwent laparoscopic cholecystectomy with gangrenous cholecystitis. Also found to have LV dysfunction with mild-moderate mitral regurgitation, PSVT, elevated troponin. On 5/4 pt found to have duodenal perforation on CT, underwent ex-lap with repair of duodenal perf with Travis Gallagher patch. Significant PMH: HTN, GERD, Visiting from [place].    PT Comments    Pt received in supine, son present, pt c/o abdominal discomfort but agreeable to gait progression with encouragement. Emphasis on log rolling for bed mobility, safety with transfers, gradual progression of gait within activity tolerance, and pursed-lip breathing. Pt remains incontinent with mobility. VSS on RA aside from elevated HR (HR max 112 bpm). Pt continues to benefit from PT services to progress toward functional mobility goals. Continue to recommend HHPT however pt currently requiring +2 physical assist to mobilize, will continue to assess disposition.  Follow Up Recommendations  Home health PT;Supervision for mobility/OOB     Equipment Recommendations  Rolling walker with 5" wheels;3in1 (PT);Other (comment) (TBA further at d/c)    Recommendations for Other Services       Precautions / Restrictions Precautions Precautions: Fall Precaution Comments: JP drain, TPN, briefs helpful (diarrhea), knee-high TED hose Restrictions Weight Bearing Restrictions: No    Mobility  Bed Mobility Overal bed mobility: Needs Assistance Bed Mobility: Rolling;Sidelying to Sit Rolling: Mod assist   Supine to sit: Mod assist;+2 for physical assistance     General bed mobility comments: pt ignoring cues for log roll technique, needed +2 HHA to raise trunk; reinforced importance of log rolling with him after he sat up     Transfers Overall transfer level: Needs assistance Equipment used: Rolling walker (2 wheeled) Transfers: Sit to/from Stand Sit to Stand: Mod assist;+2 safety/equipment         General transfer comment: assist to rise and steady, cues for hand placement with poor carryover  Ambulation/Gait Ambulation/Gait assistance: Min assist;Mod assist Gait Distance (Feet): 60 Feet (85ft, 12ft, 47ft with seated breaks) Assistive device: Rolling walker (2 wheeled) Gait Pattern/deviations: Step-through pattern;Drifts right/left;Decreased stride length Gait velocity: reduced Gait velocity interpretation: <1.8 ft/sec, indicate of risk for recurrent falls General Gait Details: mildly unsteady, weak-kneed, low amplitude steps, progressive SOB. Difficulty maintaining proximity to RW so at times needs modA for RW mgmt/directional navigation or due to lateral unsteadiness.  Sats maintained in lower 90's or better on RA, EHR 95-112 bpm   Stairs             Wheelchair Mobility    Modified Rankin (Stroke Patients Only)       Balance Overall balance assessment: Needs assistance   Sitting balance-Leahy Scale: Fair Sitting balance - Comments: supervision   Standing balance support: Bilateral upper extremity supported Standing balance-Leahy Scale: Poor Standing balance comment: reliant on RW and external assist                            Cognition Arousal/Alertness: Awake/alert Behavior During Therapy: WFL for tasks assessed/performed Overall Cognitive Status: Difficult to assess                                 General Comments: pt more animated this date and making jokes at times with staff, doing dance move in bed prior to OOB. anticipate he  is closer to cognitive baseline, will need to ask son next session.      Exercises      General Comments General comments (skin integrity, edema, etc.): some bowel incontinence noted but son had brought boxer briefs, RN  notified he will need hygiene assist after washing up at sink. pt declining to walk to toilet after gait trial.      Pertinent Vitals/Pain Pain Assessment: Faces Faces Pain Scale: Hurts even more Pain Location: abdomen Pain Descriptors / Indicators: Sore;Discomfort Pain Intervention(s): Monitored during session;Repositioned    Home Living                      Prior Function            PT Goals (current goals can now be found in the care plan section) Acute Rehab PT Goals Patient Stated Goal: get better PT Goal Formulation: With patient Time For Goal Achievement: 03/16/21 Potential to Achieve Goals: Good Progress towards PT goals: Progressing toward goals    Frequency    Min 3X/week      PT Plan Current plan remains appropriate       AM-PAC PT "6 Clicks" Mobility   Outcome Measure  Help needed turning from your back to your side while in a flat bed without using bedrails?: A Little Help needed moving from lying on your back to sitting on the side of a flat bed without using bedrails?: A Lot Help needed moving to and from a bed to a chair (including a wheelchair)?: A Lot Help needed standing up from a chair using your arms (e.g., wheelchair or bedside chair)?: A Lot Help needed to walk in hospital room?: A Lot Help needed climbing 3-5 steps with a railing? : A Lot 6 Click Score: 13    End of Session Equipment Utilized During Treatment: Gait belt Activity Tolerance: Patient tolerated treatment well Patient left: in chair;with call bell/phone within reach;with family/visitor present (pt sitting up at sink with son present to assist him with washing face/shaving) Nurse Communication: Mobility status;Other (comment) (TPN IV beeping) PT Visit Diagnosis: Other abnormalities of gait and mobility (R26.89) Pain - part of body:  (abdomen incisions)     Time: 3244-0102 PT Time Calculation (min) (ACUTE ONLY): 32 min  Charges:  $Gait Training: 8-22  mins $Therapeutic Activity: 8-22 mins                     Travis Gallagher P., PTA Acute Rehabilitation Services Pager: [phone] Office: [phone]   Travis Gallagher 03/09/2021, 5:32 PM

## 2021-03-09 NOTE — Progress Notes (Signed)
8 Days Post-Op  Subjective: CC: Patient reports continued pain around his RUQ and midline wound. No n/v. Was npo yesterday. Passing flatus. 2-3 semisolid bms yesterday. Voiding. Mobilized with therapies.   Objective: Vital signs in last 24 hours: Temp:  [97.1 F (36.2 C)-100 F (37.8 C)] 98.2 F (36.8 C) (05/12 0800) Pulse Rate:  [90-98] 90 (05/12 0800) Resp:  [20-28] 20 (05/12 0800) BP: (146-161)/(65-89) 150/89 (05/12 0800) SpO2:  [95 %-97 %] 97 % (05/12 0800) Weight:  [65.7 kg] 65.7 kg (05/12 0426) Last BM Date: 03/07/21  Intake/Output from previous day: 05/11 0701 - 05/12 0700 In: 1727.9 [I.V.:1477.8; IV Piggyback:250.1] Out: 2250 [Urine:2250] Intake/Output this shift: No intake/output data recorded.  PE: General: pleasant male laying in bed in NAD Heart:Reg rate Lungs:On room air. CTA b/l. Normal rate and effort. 750 on IS YBO:FBPZ, hypoactive bowel sounds, mild to moderate distendion with anasarca of the abdomen, JP with bloody SS output. Moderate tenderness of the right side of his abdomenand patient grimaces but no rigidity or guarding. Top of wound with staples removed and base of wound with healthy granulation tissue. Base of wound with staples in place, c/d/i. There is localized erythema around staples but no induration, fluctuance, heat or other signs of cellulitis. GU: Chaperone RN present. Scrotal edema present Skin: warm and dry with no masses, lesions, or rashes Msk: improved LE pitting edema noted. TED hose and SCD's in place.  Psych:alert, interactive, appropriate     Lab Results:  Recent Labs    03/07/21 0740 03/08/21 0339  WBC 13.2* 14.3*  HGB 9.5* 9.4*  HCT 29.4* 28.9*  PLT 215 220   BMET Recent Labs    03/08/21 0339 03/09/21 0451  NA 139 138  K 3.9 3.8  CL 108 109  CO2 26 25  GLUCOSE 119* 126*  BUN 27* 29*  CREATININE 0.96 1.07  CALCIUM 7.5* 7.8*   PT/INR No results for input(s): LABPROT, INR in the last 72 hours. CMP      Component Value Date/Time   NA 138 03/09/2021 0451   K 3.8 03/09/2021 0451   CL 109 03/09/2021 0451   CO2 25 03/09/2021 0451   GLUCOSE 126 (H) 03/09/2021 0451   BUN 29 (H) 03/09/2021 0451   CREATININE 1.07 03/09/2021 0451   CALCIUM 7.8 (L) 03/09/2021 0451   PROT 5.5 (L) 03/09/2021 0451   ALBUMIN 2.1 (L) 03/09/2021 0451   AST 31 03/09/2021 0451   ALT 24 03/09/2021 0451   ALKPHOS 85 03/09/2021 0451   BILITOT 0.6 03/09/2021 0451   GFRNONAA >60 03/09/2021 0451   Lipase     Component Value Date/Time   LIPASE 92 (H) 02/25/2021 0052       Studies/Results: CT ANGIO CHEST PE W OR WO CONTRAST  Result Date: 03/07/2021 CLINICAL DATA:  Abdominal pain, fever, postoperative EXAM: CT ANGIOGRAPHY CHEST WITH CONTRAST TECHNIQUE: Multidetector CT imaging of the chest was performed using the standard protocol during bolus administration of intravenous contrast. Multiplanar CT image reconstructions and MIPs were obtained to evaluate the vascular anatomy. CONTRAST:  OMNIPAQUE IOHEXOL 350 MG/ML SOLN COMPARISON:  03/01/2021 FINDINGS: Cardiovascular: Satisfactory opacification of the pulmonary arteries to the segmental level. No evidence of pulmonary embolism. Normal heart size. Left coronary artery calcifications. No pericardial effusion. Severe aortic atherosclerosis. Mediastinum/Nodes: No enlarged mediastinal, hilar, or axillary lymph nodes. Thyroid gland, trachea, and esophagus demonstrate no significant findings. Lungs/Pleura: Moderate bilateral pleural effusions and associated atelectasis or consolidation. Upper Abdomen: Please see separately dictated  examination of the abdomen and pelvis. Musculoskeletal: No chest wall abnormality. No acute or significant osseous findings. Review of the MIP images confirms the above findings. IMPRESSION: 1. Negative examination for pulmonary embolism. 2. Moderate bilateral pleural effusions and associated atelectasis or consolidation. 3. Coronary artery disease.  Aortic Atherosclerosis (ICD10-I70.0). Electronically Signed   By: Lauralyn Primes M.D.   On: 03/07/2021 12:54   CT ABDOMEN PELVIS W CONTRAST  Result Date: 03/07/2021 CLINICAL DATA:  Abdominal pain, fever, postoperative, status post recent cholecystectomy and duodenal perforation repair EXAM: CT ABDOMEN AND PELVIS WITH CONTRAST TECHNIQUE: Multidetector CT imaging of the abdomen and pelvis was performed using the standard protocol following bolus administration of intravenous contrast. CONTRAST:  OMNIPAQUE IOHEXOL 350 MG/ML SOLN COMPARISON:  03/01/2021 FINDINGS: Lower chest: Please see separately dictated CT examination of the chest. Hepatobiliary: No solid liver abnormality is seen. Numerous cysts throughout the liver parenchyma. Status post cholecystectomy. No biliary ductal dilatation. Surgical drain is present in the gallbladder fossa. No fluid collection identified. Pancreas: Unremarkable. No pancreatic ductal dilatation or surrounding inflammatory changes. Spleen: Normal in size without significant abnormality. Adrenals/Urinary Tract: Adrenal glands are unremarkable. Kidneys are normal, without renal calculi, solid lesion, or hydronephrosis. Bladder is unremarkable. Stomach/Bowel: Stomach is within normal limits. Appendix appears normal. No evidence of bowel wall thickening, distention, or inflammatory changes. Vascular/Lymphatic: Aortic atherosclerosis. 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 No enlarged abdominal or pelvic lymph nodes. Reproductive: No mass or other significant abnormality. Other: Interval midline laparotomy. Interval decrease in attenuation of a rim enhancing fluid collection overlying the right abdominal wall musculature, measuring 14.1 x 9.2 x 2.1 cm (series 7,  image 67). There are multiple fluid loculations in the ventral abdomen about the gastrohepatic ligament, porta hepatis, and right hemicolon, the largest located centrally measuring approximately 5.1 x 3.7 cm (series 7, image 38). Small volume free ascites throughout the abdomen and pelvis. Musculoskeletal: No acute or significant osseous findings. IMPRESSION: 1. Interval midline laparotomy. 2. Surgical drain is present in the gallbladder fossa. No fluid collection identified in the gallbladder fossa. 3. There are multiple fluid loculations in the ventral abdomen about the gastrohepatic ligament, porta hepatis, and right hemicolon, the largest located centrally measuring approximately 5.1 x 3.7 cm. These may reflect seroma or abscess in the postoperative setting. The presence or absence of infection is not established by CT. 4. Small volume free ascites throughout the abdomen and pelvis. 5. 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 Recommend nonemergent vascular consultation if not already obtained and if clinically appropriate. This recommendation follows ACR consensus guidelines: White Paper of the ACR Incidental Findings Committee II on Vascular Findings. J Am Coll Radiol 2013; 10:789-794. Aortic Atherosclerosis (ICD10-I70.0). Electronically Signed   By: Lauralyn Primes M.D.   On: 03/07/2021 13:10   VAS [place] LOWER EXTREMITY VENOUS (DVT)  Result Date: 03/07/2021  Lower Venous DVT Study Patient Name:  Travis Gallagher [facility]  Date of Exam:   03/07/2021 Medical Rec #: [id number]                  Accession #:    [id number] Date of Birth:  [birth year]                  Patient Gender: M Patient Age:   89Y Exam Location:  [facility] Procedure:      VAS [place] LOWER EXTREMITY VENOUS (DVT) Referring Phys: [id number] Jairo Bellew M  Fadel Clason --------------------------------------------------------------------------------  Indications: Swelling. Other Indications: Acute pancreatitis with swelling of abdomen and pelvis. Comparison Study: No previous exams Performing Technologist: Jody Hill RVT, RDMS  Examination Guidelines: A complete evaluation includes B-mode imaging, spectral Doppler, color Doppler, and power Doppler as needed of all accessible portions of each vessel. Bilateral testing is considered an integral part of a complete examination. Limited examinations for reoccurring indications may be performed as noted. The reflux portion of the exam is performed with the patient in reverse Trendelenburg.  +---------+---------------+---------+-----------+----------+--------------+ RIGHT    CompressibilityPhasicitySpontaneityPropertiesThrombus Aging +---------+---------------+---------+-----------+----------+--------------+ CFV      Full           Yes      Yes                                 +---------+---------------+---------+-----------+----------+--------------+ SFJ      Full                                                        +---------+---------------+---------+-----------+----------+--------------+ FV Prox  Full           Yes      Yes                                 +---------+---------------+---------+-----------+----------+--------------+ FV Mid   Full           Yes      Yes                                 +---------+---------------+---------+-----------+----------+--------------+ FV DistalFull           Yes      Yes                                 +---------+---------------+---------+-----------+----------+--------------+ PFV      Full                                                        +---------+---------------+---------+-----------+----------+--------------+ POP      Full           Yes      Yes                                  +---------+---------------+---------+-----------+----------+--------------+ PTV      Full                                                        +---------+---------------+---------+-----------+----------+--------------+  PERO     Full                                                        +---------+---------------+---------+-----------+----------+--------------+   +---------+---------------+---------+-----------+----------+--------------+ LEFT     CompressibilityPhasicitySpontaneityPropertiesThrombus Aging +---------+---------------+---------+-----------+----------+--------------+ CFV      Full           Yes      Yes                                 +---------+---------------+---------+-----------+----------+--------------+ SFJ      Full                                                        +---------+---------------+---------+-----------+----------+--------------+ FV Prox  Full           Yes      Yes                                 +---------+---------------+---------+-----------+----------+--------------+ FV Mid   Full           Yes      Yes                                 +---------+---------------+---------+-----------+----------+--------------+ FV DistalFull           Yes      Yes                                 +---------+---------------+---------+-----------+----------+--------------+ PFV      Full                                                        +---------+---------------+---------+-----------+----------+--------------+ POP      Full           Yes      Yes                                 +---------+---------------+---------+-----------+----------+--------------+ PTV      Full                                                        +---------+---------------+---------+-----------+----------+--------------+ PERO     Full                                                         +---------+---------------+---------+-----------+----------+--------------+  Summary: BILATERAL: - No evidence of deep vein thrombosis seen in the lower extremities, bilaterally. - No evidence of superficial venous thrombosis in the lower extremities, bilaterally. -No evidence of popliteal cyst, bilaterally.   *See table(s) above for measurements and observations. Electronically signed by Gretta Beganodd Early MD on 03/07/2021 at 3:48:52 PM.    Final     Anti-infectives: Anti-infectives (From admission, onward)   Start     Dose/Rate Route Frequency Ordered Stop   03/01/21 1800  piperacillin-tazobactam (ZOSYN) IVPB 3.375 g        3.375 g 12.5 mL/hr over 240 Minutes Intravenous Every 8 hours 03/01/21 1732     02/26/21 1500  metroNIDAZOLE (FLAGYL) IVPB 500 mg  Status:  Discontinued        500 mg 100 mL/hr over 60 Minutes Intravenous Every 8 hours 02/26/21 1414 03/01/21 1732   02/22/21 2000  cefTRIAXone (ROCEPHIN) 2 g in sodium chloride 0.9 % 100 mL IVPB  Status:  Discontinued        2 g 200 mL/hr over 30 Minutes Intravenous Daily at 10 pm 02/22/21 1339 03/01/21 1716   02/22/21 1600  metroNIDAZOLE (FLAGYL) IVPB 500 mg  Status:  Discontinued        500 mg 100 mL/hr over 60 Minutes Intravenous Every 8 hours 02/22/21 1339 02/26/21 1414       Assessment/Plan Aneurysm of the infrarenal abdominal aorta measuring up to 3.3 x 2.7 cm - noted on CT. Radiology recommend nonemergent vascular consultation  Elevated troponin- 32 >174 >112, EKG without ischemia changes,cards cleared for surgery E.coli, Enterobacterales bacteremia -onIV abx. Repeat Cx's with NGTD Pleural effusions - off o2 this morning. On lasix BID. Pulm toilet(brought IS into room today)  Acute biliary pancreatitis, suspect resolving ascending cholangitis, gangrenous cholecystitis  s/p lap chole with Dr. Dwain SarnaWakefield on 5/1 - POD#11 - operative findings of liver cysts and cirrhosis- will need PCP/GI follow up - Path:Acute and chronic  cholecystitis with cholelithiasis - No fluid collection identified in the gallbladder fossa on CT 5/10 - LFT wnl 5/12  Duodenal perforation s/p exp lap with primary repair of duodenal perforation with Cheree DittoGraham patch - Dr. Freida BusmanAllen 5/4 POD #8 -Tmax 100 overnight.WBC20 > 13 > 15.1>13.5> 13.2 >14.3 > pending this am. Trend - UGInegative 5/9. Follow up xray with contrast that passed into colon. Having bowel function but still having distension and pain. Discussed with MD. Will give trial of CLD  - CT A/P 5/10 w/ multiple fluid loculations in the ventral abdomen w/ largest measuring 5.1 x 3.7 cm. Reviewed with MD. Fluid collection appear near surgical drain. Did not recommend IR consultation. Will likely need repeat CT scan in next few days to determine if this is developing into a drainable abscess.  - BID WTD for midline wound. Top 8 staples removed from midline 5/10 and hematoma was evacuated.Continue dressing changes. No signs of cellulitis on exam today.  - PT/OT - recommending home with HH -Continue JP and monitor - TPN - Cont IV abx  FEN: CLD, TPN. Follow prealb. ID: ceftriaxone 4/27>5/3, flagyl 4/26>5/4, zosyn 5/4>> Foley: Removed5/6., Voiding. VTE: SCDs,Lovenox  Updated patients son with patients permission.    LOS: 15 days    Jacinto HalimMichael M Nadea Kirkland , [facility] 03/09/2021, 10:19 AM Please see Amion for pager number during day hours 7:00am-4:30pm

## 2021-03-09 NOTE — Progress Notes (Addendum)
PHARMACY - TOTAL PARENTERAL NUTRITION CONSULT NOTE  Indication: Prolonged ileus  Patient Measurements: Weight: 65.7 kg (144 lb 13.5 oz)   Body mass index is 22.69 kg/m.  Assessment:  79 YOM presented to [facility] with abdominal pain, found to have acute pancreatitis in setting of choledocholithiasis and transferred to [facility] on 02/22/21. Underwent ex-lap for gallstone pancreatitis on 5/1. Had contrast extravasation from proximal duodenum with bilious output and underwent ex-lap with repair of duodenal perforation with [name] patch on 5/4. Patient has inadequate nutrition for 9 days, and Pharmacy consulted to dose TPN.  Patient has been on a clear liquid diet with intermittent NPO status. Diet advanced to soft post-surgery on 5/2 but soon reverted to NPO for second surgery on 5/4. Per documentation, no recent weight changes and patient was eating well PTA.  Glucose / Insulin: no hx DM - CBGs < 180. Utilized 3 units SSI in last 24hrs Electrolytes: K 3.8 with Lasix (goal >/= 4), others WNL Renal: SCr down < 1, BUN 27.  Lasix 20mg  IV BID started 5/11 PM with albumin x4 doses for anasarca Hepatic: LFTs / Tbili / TG WNL, lipase down to 92, BL prealbumin improved to 9, albumin 1.6 Intake / Output; MIVF: UOP 1.3 ml/kg/hr, gallbladder drain 39mL, flexiseal placed 5/10--no output charted, net -1L GI Imaging:  - 5/9 Abd XR: contrast throughout colon, no obstruction - 5/10 CT:  mult fluid loculations in ventral abd > seroma vs abscess, small volume free ascites throughout abd and pelvis, aneurysm of abd aorta and R common iliac artery with large burden of mural thrombus GI Surgeries / Procedures:  5/10 - evacuated hematoma at top of midline wound  Central access:  PICC placed 03/02/21 TPN start date: 03/03/21  Nutritional Goals (per RD rec on 5/5): 1900-2100 kCal, 90-105g AA, and >1.9L fluid per day  Current Nutrition:  TPN CLD started 5/9 >> NPO 5/10 d/t increased pain/distension   Plan:  Continue TPN  at goal rate 80 ml/hr to provide 94g AA and 1951 kCal, meeting 100% of needs Electrolytes in TPN: Na 27mEq/L, K 71mEq/L, Ca 85mEq/L, Mag 17mEq/L, Phos 78mmol/L, Cl:Ac 1:2 Add multivitamin and trace elements to TPN Sensitive SSI reduced to Q8H on 5/11 - consider stopping in AM if use remains minimal KCL x 4 runs to account for Lasix If no oral diet/EN soon, consider cycling TPN BMET in AM.  Standard TPN labs on Mon and Thurs  Demarqus Jocson D. 12-05-1980, PharmD, BCPS, BCCCP 03/09/2021, 7:13 AM

## 2021-03-10 DIAGNOSIS — E43 Unspecified severe protein-calorie malnutrition: Secondary | ICD-10-CM | POA: Diagnosis not present

## 2021-03-10 LAB — CBC
HCT: 26.9 % — ABNORMAL LOW (ref 39.0–52.0)
Hemoglobin: 8.7 g/dL — ABNORMAL LOW (ref 13.0–17.0)
MCH: 29 pg (ref 26.0–34.0)
MCHC: 32.3 g/dL (ref 30.0–36.0)
MCV: 89.7 fL (ref 80.0–100.0)
Platelets: 219 10*3/uL (ref 150–400)
RBC: 3 MIL/uL — ABNORMAL LOW (ref 4.22–5.81)
RDW: 13 % (ref 11.5–15.5)
WBC: 13.5 10*3/uL — ABNORMAL HIGH (ref 4.0–10.5)
nRBC: 0 % (ref 0.0–0.2)

## 2021-03-10 LAB — GLUCOSE, CAPILLARY
Glucose-Capillary: 147 mg/dL — ABNORMAL HIGH (ref 70–99)
Glucose-Capillary: 144 mg/dL — ABNORMAL HIGH (ref 70–99)
Glucose-Capillary: 144 mg/dL — ABNORMAL HIGH (ref 70–99)
Glucose-Capillary: 137 mg/dL — ABNORMAL HIGH (ref 70–99)
Glucose-Capillary: 143 mg/dL — ABNORMAL HIGH (ref 70–99)

## 2021-03-10 LAB — RENAL FUNCTION PANEL
Albumin: 3 g/dL — ABNORMAL LOW (ref 3.5–5.0)
Anion gap: 7 (ref 5–15)
BUN: 29 mg/dL — ABNORMAL HIGH (ref 8–23)
CO2: 25 mmol/L (ref 22–32)
Calcium: 8.3 mg/dL — ABNORMAL LOW (ref 8.9–10.3)
Chloride: 105 mmol/L (ref 98–111)
Creatinine, Ser: 1.12 mg/dL (ref 0.61–1.24)
GFR, Estimated: >60 mL/min (ref >60)
Glucose, Bld: 118 mg/dL — ABNORMAL HIGH (ref 70–99)
Phosphorus: 3.3 mg/dL (ref 2.5–4.6)
Potassium: 3.8 mmol/L (ref 3.5–5.1)
Sodium: 137 mmol/L (ref 135–145)

## 2021-03-10 LAB — CULTURE, BLOOD (ROUTINE X 2)
Culture: NO GROWTH
Culture: NO GROWTH
Special Requests: ADEQUATE
Special Requests: ADEQUATE

## 2021-03-10 MED ORDER — CARVEDILOL 3.125 MG PO TABS
3.1250 mg | ORAL_TABLET | Freq: Two times a day (BID) | ORAL | Status: DC
  Administered 2021-03-10 – 2021-03-14 (×8): 3.125 mg via ORAL
  Filled 2021-03-10 (×8): qty 1

## 2021-03-10 MED ORDER — METOPROLOL TARTRATE 5 MG/5ML IV SOLN: 2.5000 mg | Freq: Four times a day (QID) | INTRAVENOUS | Status: DC | PRN

## 2021-03-10 MED ORDER — TRAVASOL 10 % IV SOLN
INTRAVENOUS | Status: AC
  Filled 2021-03-10: qty 940.8

## 2021-03-10 MED ORDER — ENSURE ENLIVE PO LIQD
237.0000 mL | Freq: Three times a day (TID) | ORAL | Status: DC
  Administered 2021-03-10 – 2021-03-14 (×9): 237 mL via ORAL

## 2021-03-10 MED ORDER — POTASSIUM CHLORIDE 10 MEQ/50ML IV SOLN
10.0000 meq | INTRAVENOUS | Status: AC
  Administered 2021-03-10 (×4): 10 meq via INTRAVENOUS
  Filled 2021-03-10 (×4): qty 50

## 2021-03-10 NOTE — Progress Notes (Signed)
PHARMACY - TOTAL PARENTERAL NUTRITION CONSULT NOTE  Indication: Prolonged ileus  Patient Measurements: Weight: 61.4 kg (135 lb 5.8 oz)   Body mass index is 21.2 kg/m.  Assessment:  64 YOM presented to [facility] with abdominal pain, found to have acute pancreatitis in setting of choledocholithiasis and transferred to [facility] on 02/22/21. Underwent ex-lap for gallstone pancreatitis on 5/1. Had contrast extravasation from proximal duodenum with bilious output and underwent ex-lap with repair of duodenal perforation with [name] patch on 5/4. Patient has inadequate nutrition for 9 days, and Pharmacy consulted to dose TPN.  Patient has been on a clear liquid diet with intermittent NPO status. Diet advanced to soft post-surgery on 5/2 but soon reverted to NPO for second surgery on 5/4. Per documentation, no recent weight changes and patient was eating well PTA.  Glucose / Insulin: no hx DM - CBGs < 180. Utilized 4 units SSI in last 24hrs Electrolytes: K 3.8 with Lasix (goal >/= 4), others WNL Renal: SCr up 1.12, BUN 29.  Lasix 20mg  IV BID started 5/11 PM with albumin x4 doses for anasarca Hepatic: LFTs / Tbili / TG WNL, lipase down to 92, BL prealbumin improved to 9, albumin 3 Intake / Output; MIVF: UOP 2.7 ml/kg/hr, gallbladder drain 69mL, BM documented 5/12, net -3.5L GI Imaging:  - 5/9 Abd XR: contrast throughout colon, no obstruction - 5/10 CT:  mult fluid loculations in ventral abd > seroma vs abscess, small volume free ascites throughout abd and pelvis, aneurysm of abd aorta and R common iliac artery with large burden of mural thrombus GI Surgeries / Procedures:  5/10 - evacuated hematoma at top of midline wound  Central access:  PICC placed 03/02/21 TPN start date: 03/03/21  Nutritional Goals (per RD rec on 5/5): 1900-2100 kCal, 90-105g AA, and >1.9L fluid per day  Current Nutrition:  TPN FLD started 5/13 Boost Breeze TID (each provides 250 kcal and 9 grams protein) - 2 charted not  given   Plan:  Continue TPN at goal rate 80 ml/hr to provide 94g AA and 1951 kCal, meeting 100% of needs Electrolytes in TPN: Na 65mEq/L, K 21mEq/L, Ca 57mEq/L, Mag 54mEq/L, Phos 76mmol/L, Cl:Ac 1:2 Add multivitamin and trace elements to TPN Sensitive SSI reduced to Q8H on 5/11 - consider stopping in AM if use remains minimal KCL x 4 runs to account for Lasix If no oral diet/EN soon, consider cycling TPN BMET in AM.  Standard TPN labs on Mon and Thurs  Thank you for involving pharmacy in this patient's care.  Mon, PharmD, BCPS Clinical Pharmacist Clinical phone for 03/10/2021 until 3p is 03/12/2021 03/10/2021 7:07 AM  **Pharmacist phone directory can be found on [email or web address] listed under [facility]**

## 2021-03-10 NOTE — Progress Notes (Signed)
Physical Therapy Treatment Patient Details Name: Travis Gallagher MRN: [id number] DOB: 27-Sep-1931 Today's Date: 03/10/2021    History of Present Illness 87 y.o. M admitted 02/22/2021 with gallstone pancreatitis, underwent laparoscopic cholecystectomy with gangrenous cholecystitis. Also found to have LV dysfunction with mild-moderate mitral regurgitation, PSVT, elevated troponin. On 5/4 pt found to have duodenal perforation on CT, underwent ex-lap with repair of duodenal perf with [name] patch. Significant PMH: HTN, GERD, Visiting from [place].    PT Comments    Pt making good progress toward mobility goals, received in supine and with improved 1-step command following for bed mobility but continues to need multimodal cues and increased time for sit<>stand transfer safety. Pt needing min to modA for gait progression with RW but continues to need chair follow due to decreased endurance and tachypneic with exertion needing >2 minute seated breaks between trials. OT also present during session due to pt multidisciplinary therapy needs and decreased activity tolerance. Pt continues to benefit from PT services to progress toward functional mobility goals. Continue to recommend HHPT, pt currently still requiring +2 physical assist for safety.  Follow Up Recommendations  Home health PT;Supervision for mobility/OOB     Equipment Recommendations  Rolling walker with 5" wheels;3in1 (PT);Other (comment) (TBA further at d/c)    Recommendations for Other Services       Precautions / Restrictions Precautions Precautions: Fall Precaution Comments: JP drain, TPN, briefs helpful (diarrhea), knee-high TED hose Restrictions Weight Bearing Restrictions: No    Mobility  Bed Mobility Overal bed mobility: Needs Assistance Bed Mobility: Rolling;Sidelying to Sit Rolling: Mod assist Sidelying to sit: Min assist;+2 for safety/equipment;+2 for physical assistance       General bed mobility  comments: very simple cues given, "bend knees" prior to reaching cross-body, etc with improved command following; pt distracted at times and making jokes    Transfers Overall transfer level: Needs assistance Equipment used: Rolling walker (2 wheeled) Transfers: Sit to/from Stand Sit to Stand: Mod assist;+2 safety/equipment;Min assist         General transfer comment: assist to rise and steady, cues for hand placement with poor carryover; needs hand over hand assist to reach back for chair armrest prior to sitting each time; variable min/modA increased assist with fatigue and needs +2 assist for safety throughout due to lines/weakness  Ambulation/Gait Ambulation/Gait assistance: Min assist;Mod assist Gait Distance (Feet): 30 Feet (33ft x2, 54ft) Assistive device: Rolling walker (2 wheeled) Gait Pattern/deviations: Step-through pattern;Drifts right/left;Decreased stride length Gait velocity: reduced Gait velocity interpretation: <1.8 ft/sec, indicate of risk for recurrent falls General Gait Details: mildly unsteady, weak-kneed, low amplitude steps, progressive SOB. Difficulty maintaining proximity to RW so at times needs modA for RW mgmt/directional navigation or due to lateral unsteadiness.  Sats maintained in lower 90's or better on RA, respiratory rate elevated to high-30's, RR max 41 rpm post-exertion resting in chair; HR max 108 bpm seen by therapist   Stairs             Wheelchair Mobility    Modified Rankin (Stroke Patients Only)       Balance Overall balance assessment: Needs assistance   Sitting balance-Leahy Scale: Fair Sitting balance - Comments: supervision   Standing balance support: Bilateral upper extremity supported Standing balance-Leahy Scale: Poor Standing balance comment: reliant on RW, when patient reaches single UE off RW, increased LOB; needs at least min/modA external support throughout  Cognition  Arousal/Alertness: Awake/alert Behavior During Therapy: WFL for tasks assessed/performed Overall Cognitive Status: Impaired/Different from baseline Area of Impairment: Following commands;Safety/judgement;Problem solving;Memory                     Memory: Decreased short-term memory;Decreased recall of precautions Following Commands: Follows one step commands with increased time;Follows multi-step commands inconsistently Safety/Judgement: Decreased awareness of safety   Problem Solving: Slow processing;Difficulty sequencing;Requires verbal cues;Requires tactile cues General Comments: pt more animated this date and making jokes at times with staff, per son he is closer to behavioral/cognitive baseline but not quite back to normal. Per son he likes to "clown around" at baseline. Pt with difficulty following multi-step commands for log rolling, needs cues 1 at a time.      Exercises Other Exercises Other Exercises: pt pulling on IS, encouraged him to perform 10x/hr    General Comments General comments (skin integrity, edema, etc.): some drainage on bed/gown from abdominal drain, RN aware and states drain has been capped off and drainage is not new, pt skin/gown cleaned and no new drainage observed from dressing; pt tachynpeic with exertion but VSS otherwise; BP 157/76 (100) seated EOB, pt not dizzy; SpO2 WNL on RA      Pertinent Vitals/Pain Pain Assessment: Faces Faces Pain Scale: Hurts even more Pain Location: abdomen Pain Descriptors / Indicators: Sore;Discomfort;Pressure Pain Intervention(s): Monitored during session;Repositioned           PT Goals (current goals can now be found in the care plan section) Acute Rehab PT Goals Patient Stated Goal: get better PT Goal Formulation: With patient Time For Goal Achievement: 03/16/21 Potential to Achieve Goals: Good Progress towards PT goals: Progressing toward goals    Frequency    Min 3X/week      PT Plan  Current plan remains appropriate    Co-evaluation PT/OT/SLP Co-Evaluation/Treatment: Yes Reason for Co-Treatment: Complexity of the patient's impairments (multi-system involvement);Necessary to address cognition/behavior during functional activity;To address functional/ADL transfers PT goals addressed during session: Mobility/safety with mobility;Balance;Proper use of DME;Strengthening/ROM        AM-PAC PT "6 Clicks" Mobility   Outcome Measure  Help needed turning from your back to your side while in a flat bed without using bedrails?: A Little Help needed moving from lying on your back to sitting on the side of a flat bed without using bedrails?: A Lot Help needed moving to and from a bed to a chair (including a wheelchair)?: A Lot Help needed standing up from a chair using your arms (e.g., wheelchair or bedside chair)?: A Lot Help needed to walk in hospital room?: A Lot Help needed climbing 3-5 steps with a railing? : A Lot 6 Click Score: 13    End of Session Equipment Utilized During Treatment: Gait belt Activity Tolerance: Patient tolerated treatment well Patient left: in chair;with call bell/phone within reach;with family/visitor present (son present in room, heels floated in recliner) Nurse Communication: Mobility status PT Visit Diagnosis: Other abnormalities of gait and mobility (R26.89) Pain - part of body:  (abdomen incisions)     Time: 7001-7494 PT Time Calculation (min) (ACUTE ONLY): 41 min  Charges:  $Gait Training: 8-22 mins $Therapeutic Activity: 8-22 mins                     Dwyne Hasegawa P., PTA Acute Rehabilitation Services Pager: [phone] Office: [phone]   Dorathy Kinsman Janelly Switalski 03/10/2021, 1:25 PM

## 2021-03-10 NOTE — Progress Notes (Signed)
9 Days Post-Op   Subjective/Chief Complaint: Still complains of RUQ pain   Objective: Vital signs in last 24 hours: Temp:  [97.7 F (36.5 C)-99.1 F (37.3 C)] 99.1 F (37.3 C) (05/13 0800) Pulse Rate:  [94-99] 94 (05/13 0800) Resp:  [18-22] 20 (05/13 0800) BP: (139-165)/(51-74) 139/51 (05/13 0800) SpO2:  [95 %-96 %] 96 % (05/13 0800) Weight:  [61.4 kg] 61.4 kg (05/13 0535) Last BM Date: 03/09/21  Intake/Output from previous day: 05/12 0701 - 05/13 0700 In: 1280 [P.O.:40; I.V.:940; IV Piggyback:300] Out: 3800 [Urine:3800] Intake/Output this shift: Total I/O In: -  Out: 400 [Urine:400]  General appearance: alert and cooperative Resp: clear to auscultation bilaterally Cardio: regular rate and rhythm GI: soft, tender RUQ. wound clean. good bs  Lab Results:  Recent Labs    03/09/21 1054 03/10/21 0500  WBC 14.8* 13.5*  HGB 9.9* 8.7*  HCT 31.4* 26.9*  PLT 226 219   BMET Recent Labs    03/09/21 0451 03/10/21 0500  NA 138 137  K 3.8 3.8  CL 109 105  CO2 25 25  GLUCOSE 126* 118*  BUN 29* 29*  CREATININE 1.07 1.12  CALCIUM 7.8* 8.3*   PT/INR No results for input(s): LABPROT, INR in the last 72 hours. ABG No results for input(s): PHART, HCO3 in the last 72 hours.  Invalid input(s): PCO2, PO2  Studies/Results: No results found.  Anti-infectives: Anti-infectives (From admission, onward)   Start     Dose/Rate Route Frequency Ordered Stop   03/01/21 1800  piperacillin-tazobactam (ZOSYN) IVPB 3.375 g        3.375 g 12.5 mL/hr over 240 Minutes Intravenous Every 8 hours 03/01/21 1732     02/26/21 1500  metroNIDAZOLE (FLAGYL) IVPB 500 mg  Status:  Discontinued        500 mg 100 mL/hr over 60 Minutes Intravenous Every 8 hours 02/26/21 1414 03/01/21 1732   02/22/21 2000  cefTRIAXone (ROCEPHIN) 2 g in sodium chloride 0.9 % 100 mL IVPB  Status:  Discontinued        2 g 200 mL/hr over 30 Minutes Intravenous Daily at 10 pm 02/22/21 1339 03/01/21 1716   02/22/21  1600  metroNIDAZOLE (FLAGYL) IVPB 500 mg  Status:  Discontinued        500 mg 100 mL/hr over 60 Minutes Intravenous Every 8 hours 02/22/21 1339 02/26/21 1414      Assessment/Plan: s/p Procedure(s): EXPLORATORY LAPAROTOMY - PRIMARY REPAIR OF DUODENAL PERFORATION WITH GRAHAM PATCH;  INSERTION OF 19 FR. DRAIN (N/A) Advance diet. 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 Radiology recommend nonemergent vascular consultation  Elevated troponin- 32 >174 >112, EKG without ischemia changes,cards cleared for surgery E.coli, Enterobacterales bacteremia -onIV abx. Repeat Cx's with NGTD Pleural effusions - off o2 this morning. On lasix BID. Pulm toilet(brought IS into room today)  Acute biliary pancreatitis, suspect resolving ascending cholangitis, gangrenous cholecystitis  s/p lap chole with Dr. Dwain Sarna on 5/1 - POD#12 - operative findings of liver cysts and cirrhosis- will need PCP/GI follow up - Path:Acute and chronic cholecystitis with cholelithiasis - No fluid collection identified in the gallbladder fossa on CT 5/10 - LFT wnl 5/12  Duodenal perforation s/p exp lap with primary repair of duodenal perforation with [name] patch - Dr. Freida Busman 5/4 POD #9 -Tmax 100 overnight.WBC20 > 13 > 15.1>13.5> 13.2>14.3> pending this am. Trend - UGInegative 5/9. Follow up xray with contrast that passed  into colon. Having bowel function but still having distension and pain. Discussed with MD. Will give trial of CLD  - CT A/P 5/10 w/ multiple fluid loculations in the ventral abdomen w/ largest measuring 5.1 x 3.7 cm. Reviewed with MD. Fluid collection appear near surgical drain. Did not recommend IR consultation. Will likely need repeat CT scan in next few days to determine if this is developing into a drainable abscess.  - BID WTD for midline wound. Top 8 staples removed from midline 5/10 and hematoma was  evacuated.Continue dressing changes. No signs of cellulitis on exam today.  - PT/OT - recommending home with HH -Continue JP and monitor - TPN - Cont IV abx  FEN: CLD, TPN. Follow prealb. ID: ceftriaxone 4/27>5/3, flagyl 4/26>5/4, zosyn 5/4>> Foley: Removed5/6., Voiding. VTE: SCDs,Lovenox  LOS: 16 days    Travis Gallagher 03/10/2021

## 2021-03-10 NOTE — Plan of Care (Signed)

## 2021-03-10 NOTE — Progress Notes (Signed)
Nutrition Follow Up  DOCUMENTATION CODES:   Severe malnutrition in context of acute illness/injury  INTERVENTION:    Continue TPN to meet 100% of needs until diet is progressed and intake remains consistent  Transition to Ensure Enlive po TID, each supplement provides 350 kcal and 20 grams of protein  Add Magic cup TID with meals, each supplement provides 290 kcal and 9 grams of protein  NUTRITION DIAGNOSIS:   Severe Malnutrition related to acute illness (acute pancreatitis) as evidenced by severe fat depletion,severe muscle depletion,energy intake < or equal to 50% for > or equal to 5 days.  Diet advanced   GOAL:   Patient will meet greater than or equal to 90% of their needs   Progressing   MONITOR:   PO intake,Supplement acceptance,Weight trends,Labs,I & O's,Diet advancement  REASON FOR ASSESSMENT:   NPO/Clear Liquid Diet   ASSESSMENT:   87 yo male with a PMH of HTN and GERD who presents with acute pancreatitis.   5/02 - lap cholecystectomy for gangrenous cholecystitis 5/04 - brought to the operating room emergently for exploration and duodenal perforation repair 5/05 - NGT placed for onset of abdominal pain, TPN started 5/09- diet advanced to clear liquid  5/10 - evacuated hematoma at top of midline wound 5/12 - diet advanced to clear liquid  Patient tolerating sips of water. Per RN, did not take much from clear liquid trays. Refused Boost Breeze this am. Diet advanced to full liquid. Will switch supplement to Ensure Enlive as it provides more kcal and protein.   TPN continues to run at 80 ml/hr to provide 94 grams protein and 1951 kcal. Suspect meeting needs by mouth is going to be challenging given ongoing abdominal pain. Recommend continuation of TPN to meet 100% of needs until PO intake progresses and remains consistent.   Admission weight: 64 kg  Current weight: 61.4 kg   UOP: 3800 ml x 24 hrs   Medications: 20 mg lasix BID, SS novolog Labs: CBG  130-147  Diet Order:   Diet Order            Diet full liquid Room service appropriate? Yes; Fluid consistency: Thin  Diet effective now                EDUCATION NEEDS:   Education needs have been addressed  Skin:  Skin Assessment: Skin Integrity Issues: Skin Integrity Issues:: Incisions Incisions: Abdomen x 5, closed  Last BM:  5/12  Height:  Ht Readings from Last 1 Encounters:  02/21/21 5\' 7"  (1.702 m)   Weight:  Wt Readings from Last 1 Encounters:  03/10/21 61.4 kg   Ideal Body Weight:  67.3 kg  BMI:  Body mass index is 21.2 kg/m.  Estimated Nutritional Needs:   Kcal:  1900-2100   Protein:  90-105 grams   Fluid:  >1.9 L  03/12/21 RD, LDN Clinical Nutrition Pager listed in [facility]

## 2021-03-10 NOTE — Progress Notes (Signed)
PROGRESS NOTE  Travis Gallagher TIR:[id number] DOB: 07-16-1931 DOA: 02/22/2021 PCP: Pcp, No   LOS: 16 days   Brief narrative:  Travis Gallagher is a 87 y.o. male with medical history significant for hypertension, GERD was admitted to hospital as transfer from [facility] for management of acute pancreatitis in the setting of potential choledocholithiasis.  Patient was also found to have acute biliary pancreatitis.  GI was initially consulted but MRCP did not show choledocholithiasis.  Patient underwent laparoscopic cholecystectomy on 02/26/2021.  Cardiology was consulted for preop risk stratification.    During hospitalization, post -operatively patient continued to have abdominal pain and distention with renal failure.  He was noted to have duodenal perforation and biliary leak so underwent relaparotomy on 03/01/2021.  After relaparotomy patient had briefly had an NG tube placed in.  Upper GI study was performed on 03/06/2021.  Patient did have a spikes of fever so CT scan of the abdomen chest has been ordered.  Patient has remained on TPN.   Assessment/Plan:    Principal Problem:   Acute pancreatitis Active Problems:   Severe sepsis (HCC)   Cholangitis   Abdominal pain   Nausea   Hypertension   GERD (gastroesophageal reflux disease)   Elevated troponin   Protein-calorie malnutrition, severe  Acute biliary pancreatitis:    Patient underwent laparoscopic cholecystectomy by general surgery on 02/26/2021.  Postoperatively, patient continued to have increasing pain and distention and renal failure.  CT scan of the abdomen was performed which showed signs of biliary leak.  Patient was then taken back to the OR on 03/01/2021 and was noted to have a duodenal perforation.  He underwent laparotomy with primary repair of duodenal perforation with Phillip Heal patch by Dr. Zenia Resides on 03/01/21.  Patient underwent upper GI study on 03/06/2021 without note of extravasation.  Contrast flow was demonstrated to  the colon..  Continue IV Zosyn due to duodenal perforation.  Continue TPN for nutrition.  Diet advanced to full liquids.  Fever.  ?  If related to atelectasis.  CT of the chest abdomen pelvis did not show any evidence of PE.  Initial blood culture on admission was positive for E. coli, but repeat cultures have not shown any growth. There was mention of fluid collection on CT of abdomen near surgical site, may need repeat CT in next few days to monitor for developing abscess  Duodenal perforation, biliary leak.  Status post exploratory laparotomy with repair of duodenal perforation on 03/01/2021.    Continue IV Zosyn due to biliary leak.  Currently patient is on full liquids and on TPN.  Mild leukocytosis.  Per surgery, diet has been advanced to full liquids. We will continue to monitor.  CBC Latest Ref Rng & Units 03/10/2021 03/09/2021 03/08/2021  WBC 4.0 - 10.5 K/uL 13.5(H) 14.8(H) 14.3(H)  Hemoglobin 13.0 - 17.0 g/dL 8.7(L) 9.9(L) 9.4(L)  Hematocrit 39.0 - 52.0 % 26.9(L) 31.4(L) 28.9(L)  Platelets 150 - 400 K/uL 219 226 220    Liver cyst and cirrhosis during surgical intervention.  Will need outpatient PCP and GI follow-up.    Severe sepsis/enterobacter and E. coli bacteremia secondary to ascending cholangitis, gangrenous cholecystitis Currently on IV Zosyn, secondary to duodenal perforation.  Patient initially met sepsis criteria on admission. CT of the chest, abdomen and pelvis was negative for pulmonary embolism, also showed multiple fluid collections in the ventral abdomen related to possible seromas versus abscess.  Tmax of 99.1 in last 24hrs.  Continue to monitor.   Elevated troponin with  LV systolic dysfunction, acute on chronic combined congestive heart failure. EKG did not show any ischemic changes.  Possibly secondary to demand ischemia . 2D echocardiogram with left ventricular ejection fraction of 40 to 45% with diffuse hypokinesis and abnormal septal motion wall.  Cardiology followed the  patient during hospitalization.  Aspirin on hold due to recent postop status.  Will need outpatient follow-up with cardiology. Continue on coreg. He has been receiving IV lasix with good urine output. May need to discharge with oral diuretics   Mild Dilation of acending aorta Follow-up with vascular surgery as outpatient.    PSVT- 2D echo with EF of 45 to 50%.  Continue on coreg.  Will need repeat TSH follow-up as outpatient.   Acute kidney injury Improved.  Latest creatinine of 1.0   Essential hypertension:  Not on antihypertensives at home.  Currently on coreg.  IV hydralazine has been added for better control.  Continue to monitor.     GERD: Continue IV PPI   Debility, weakness.  Physical therapy has seen the patient and recommend home health PT on discharge.  Encouraged ambulation.    Hypophosphatemia.   Resolved after replacement  Anasarca/bilateral pleural effusions -Patient noted to have significant hypoalbuminemia -he has had good urine output with lasix (had 3.8L out yesterday) -continue lasix for another 24 hours    DVT prophylaxis: Place TED hose Start: 03/03/21 1742 enoxaparin (LOVENOX) injection 40 mg Start: 03/03/21 1000 Place and maintain sequential compression device Start: 02/23/21 1520 SCDs Start: 02/22/21 1225   Code Status: Full code  Family Communication:  I again spoke with the patient's son at bedside.    Status is: Inpatient  Remains inpatient appropriate because: IV treatments appropriate due to intensity of illness or inability to take PO and Inpatient level of care appropriate due to severity of illness, status post exploratory laparotomy, parenteral nutrition, IV antibiotics.  Dispo: The patient is from: Home              Anticipated d/c is to: Home with home health              Patient currently is not medically stable to d/c.   Difficult to place patient No  Consultants:  GI  Cardiology  General surgery  Procedures:  Laparoscopic  cholecystectomy 02/26/2021  Exploratory laparotomy with primary repair of duodenal perforation with Phillip Heal patch on 03/01/2021  NG tube placement and removal.  Right upper extremity PICC placement on 03/02/2021  Anti-infectives:  Marland Kitchen Ceftriaxone and metronidazole IV 4/27>5/4 . Zosyn 03/01/2021>  Anti-infectives (From admission, onward)   Start     Dose/Rate Route Frequency Ordered Stop   03/01/21 1800  piperacillin-tazobactam (ZOSYN) IVPB 3.375 g        3.375 g 12.5 mL/hr over 240 Minutes Intravenous Every 8 hours 03/01/21 1732     02/26/21 1500  metroNIDAZOLE (FLAGYL) IVPB 500 mg  Status:  Discontinued        500 mg 100 mL/hr over 60 Minutes Intravenous Every 8 hours 02/26/21 1414 03/01/21 1732   02/22/21 2000  cefTRIAXone (ROCEPHIN) 2 g in sodium chloride 0.9 % 100 mL IVPB  Status:  Discontinued        2 g 200 mL/hr over 30 Minutes Intravenous Daily at 10 pm 02/22/21 1339 03/01/21 1716   02/22/21 1600  metroNIDAZOLE (FLAGYL) IVPB 500 mg  Status:  Discontinued        500 mg 100 mL/hr over 60 Minutes Intravenous Every 8 hours 02/22/21 1339 02/26/21 1414  Subjective: Report some pain over incision sites, no vomiting. He has been passing gas, but has not had a significant BM. Feels that breathing is improving Objective: Vitals:   03/10/21 0600 03/10/21 0800  BP:  (!) 139/51  Pulse:  94  Resp: 20 20  Temp:  99.1 F (37.3 C)  SpO2:  96%    Intake/Output Summary (Last 24 hours) at 03/10/2021 1117 Last data filed at 03/10/2021 0935 Gross per 24 hour  Intake 1230 ml  Output 3925 ml  Net -2695 ml   Filed Weights   03/08/21 0349 03/09/21 0426 03/10/21 0535  Weight: 67.4 kg 65.7 kg 61.4 kg   Body mass index is 21.2 kg/m.   Physical Exam: General exam: Alert, awake, oriented x 3 Respiratory system: crackles at bases. Respiratory effort normal. Cardiovascular system:RRR. No murmurs, rubs, gallops. Gastrointestinal system: Abdomen is distended, soft and tender over incision  sites. No organomegaly or masses felt. Normal bowel sounds heard. Central nervous system: Alert and oriented. No focal neurological deficits. Extremities: 1+ lower extremity edema, improving Skin: No rashes, lesions or ulcers Psychiatry: Judgement and insight appear normal. Mood & affect appropriate.    Data Review: I have personally reviewed the following laboratory data and studies,  CBC: Recent Labs  Lab 03/06/21 0300 03/07/21 0740 03/08/21 0339 03/09/21 1054 03/10/21 0500  WBC 13.5* 13.2* 14.3* 14.8* 13.5*  NEUTROABS 11.3*  --   --   --   --   HGB 10.2* 9.5* 9.4* 9.9* 8.7*  HCT 31.0* 29.4* 28.9* 31.4* 26.9*  MCV 89.1 93.6 90.6 92.1 89.7  PLT 207 215 220 226 024   Basic Metabolic Panel: Recent Labs  Lab 03/04/21 0410 03/05/21 0435 03/06/21 0300 03/07/21 1213 03/08/21 0339 03/09/21 0451 03/10/21 0500  NA 139 137 134* 137 139 138 137  K 3.9 3.9 3.9 3.9 3.9 3.8 3.8  CL 106 104 102 105 108 109 105  CO2 _0 GLUCOSE 120* 155* 141* 136* 119* 126* 118*  BUN 26* 23 24* 26* 27* 29* 29*  CREATININE 1.00 1.00 0.93 0.98 0.96 1.07 1.12  CALCIUM 7.6* 7.4* 7.3* 7.5* 7.5* 7.8* 8.3*  MG 1.7 1.8 2.0 2.1  --  2.2  --   PHOS 2.9 2.3* 3.2 3.0  --  3.3 3.3   Liver Function Tests: Recent Labs  Lab 03/05/21 0435 03/06/21 0300 03/07/21 1213 03/08/21 0339 03/09/21 0451 03/10/21 0500  AST _1 --   ALT _2 --   ALKPHOS 106 111 100 90 85  --   BILITOT 0.5 0.4 0.2* 0.7 0.6  --   PROT 4.8* 4.8* 5.1* 5.0* 5.5*  --   ALBUMIN 1.7* 1.6* 1.7* 1.6* 2.1* 3.0*   No results for input(s): LIPASE, AMYLASE in the last 168 hours. No results for input(s): AMMONIA in the last 168 hours. Cardiac Enzymes: No results for input(s): CKTOTAL, CKMB, CKMBINDEX, TROPONINI in the last 168 hours. BNP (last 3 results) No results for input(s): BNP in the last 8760 hours.  ProBNP (last 3 results) No results for input(s): PROBNP in the last 8760  hours.  CBG: Recent Labs  Lab 03/09/21 0618 03/09/21 1237 03/09/21 1600 03/09/21 2129 03/10/21 0618  GLUCAP 136* 130* 141* 147* 144*   Recent Results (from the past 240 hour(s))  Culture, blood (Routine X 2) w Reflex to ID Panel     Status: None (Preliminary result)   Collection Time: 03/05/21  5:30 PM   Specimen: BLOOD  Result Value Ref Range Status   Specimen Description BLOOD LEFT ANTECUBITAL  Final   Special Requests   Final    BOTTLES DRAWN AEROBIC ONLY Blood Culture adequate volume   Culture   Final    NO GROWTH 4 DAYS Performed at [facility], [place] [address], [place], [place] [address]    Report Status PENDING  Incomplete  Culture, blood (Routine X 2) w Reflex to ID Panel     Status: None (Preliminary result)   Collection Time: 03/05/21  5:36 PM   Specimen: BLOOD LEFT WRIST  Result Value Ref Range Status   Specimen Description BLOOD LEFT WRIST  Final   Special Requests   Final    BOTTLES DRAWN AEROBIC ONLY Blood Culture adequate volume   Culture   Final    NO GROWTH 4 DAYS Performed at [facility], [place] [address] [place], [place] [address]    Report Status PENDING  Incomplete     Studies: No results found.   Kathie Dike, MD  [facility] 03/10/2021  If 7PM-7AM, please contact night-coverage

## 2021-03-10 NOTE — Progress Notes (Signed)
Occupational Therapy Treatment Patient Details Name: Travis Gallagher MRN: [id number] DOB: [birth year] Today's Date: 03/10/2021    History of present illness 87 y.o. M admitted 02/22/2021 with gallstone pancreatitis, underwent laparoscopic cholecystectomy with gangrenous cholecystitis. Also found to have LV dysfunction with mild-moderate mitral regurgitation, PSVT, elevated troponin. On 5/4 pt found to have duodenal perforation on CT, underwent ex-lap with repair of duodenal perf with [name] patch. Significant PMH: HTN, GERD, Visiting from [place].   OT comments  Pt making progress with functional goals. Session focused on bed mobility, LB dressing, functional mobility using RW, toilet transfers, toileting, UB dressing and ADL mobility safety. Pt very pleasant, coopertaive and seemed to bein good spirits; pt's so present. Spanish language interpreter Ashby Dawes utilized. OT will continue to follow acutely to maximize level of function and safety  Follow Up Recommendations  Home health OT    Equipment Recommendations  3 in 1 bedside commode    Recommendations for Other Services      Precautions / Restrictions Precautions Precautions: Fall Precaution Comments: JP drain, TPN, briefs helpful (diarrhea), knee-high TED hose Restrictions Weight Bearing Restrictions: No       Mobility Bed Mobility Overal bed mobility: Needs Assistance Bed Mobility: Rolling;Sidelying to Sit Rolling: Mod assist Sidelying to sit: Min assist;+2 for safety/equipment;+2 for physical assistance       General bed mobility comments: very simple cues given, "bend knees" prior to reaching cross-body, etc with improved command following; pt distracted at times and making jokes    Transfers Overall transfer level: Needs assistance Equipment used: Rolling walker (2 wheeled) Transfers: Sit to/from Stand Sit to Stand: Mod assist;+2 safety/equipment;Min assist Stand pivot transfers: Mod assist;+2 physical  assistance;+2 safety/equipment;Total assist       General transfer comment: assist to rise and steady, cues for hand placement with poor carryover; needs hand over hand assist to reach back for chair armrest prior to sitting each time; variable min/modA increased assist with fatigue and needs +2 assist for safety throughout due to lines/weakness    Balance Overall balance assessment: Needs assistance Sitting-balance support: Feet supported;Bilateral upper extremity supported Sitting balance-Leahy Scale: Fair Sitting balance - Comments: supervision   Standing balance support: Bilateral upper extremity supported Standing balance-Leahy Scale: Poor Standing balance comment: reliant on RW, when patient reaches single UE off RW, increased LOB; needs at least min/modA external support throughout                           ADL either performed or assessed with clinical judgement   ADL Overall ADL's : Needs assistance/impaired     Grooming: Wash/dry hands;Wash/dry face;Standing;Min guard           Upper Body Dressing : Printmaker: Scientist, water quality;Moderate assistance;Minimal assistance;+2 for safety/equipment;+2 for physical assistance   Toileting- Clothing Manipulation and Hygiene: Moderate assistance;Sit to/from stand;Total assistance Toileting - Clothing Manipulation Details (indicate cue type and reason): clothing mgt mod A standing at toilet to pull up brief, total A for hygiene     Functional mobility during ADLs: Moderate assistance;Minimal assistance;+2 for physical assistance;+2 for safety/equipment;Cueing for safety;Rolling walker       Vision Patient Visual Report: No change from baseline     Perception     Praxis      Cognition Arousal/Alertness: Awake/alert Behavior During Therapy: WFL for tasks assessed/performed Overall Cognitive Status: Impaired/Different from baseline Area of Impairment: Following  commands;Safety/judgement;Problem solving;Memory  Memory: Decreased short-term memory;Decreased recall of precautions Following Commands: Follows one step commands with increased time;Follows multi-step commands inconsistently Safety/Judgement: Decreased awareness of safety   Problem Solving: Slow processing;Difficulty sequencing;Requires verbal cues;Requires tactile cues General Comments: pt more animated this date and making jokes at times with staff, per son he is closer to behavioral/cognitive baseline but not quite back to normal. Per son he likes to "clown around" at baseline. Pt with difficulty following multi-step commands for log rolling, needs cues 1 at a time.        Exercises Other Exercises Other Exercises: pt pulling on IS, encouraged him to perform 10x/hr   Shoulder Instructions       General Comments some drainage on bed/gown from abdominal drain, RN aware and states drain has been capped off and drainage is not new, pt skin/gown cleaned and no new drainage observed from dressing; pt tachynpeic with exertion but VSS otherwise; BP 157/76 (100) seated EOB, pt not dizzy; SpO2 WNL on RA    Pertinent Vitals/ Pain       Pain Assessment: Faces Faces Pain Scale: Hurts even more Pain Location: abdomen Pain Descriptors / Indicators: Sore;Discomfort;Pressure Pain Intervention(s): Monitored during session;Repositioned  Home Living                                          Prior Functioning/Environment              Frequency  Min 2X/week        Progress Toward Goals  OT Goals(current goals can now be found in the care plan section)  Progress towards OT goals: Progressing toward goals  Acute Rehab OT Goals Patient Stated Goal: get better  Plan Discharge plan remains appropriate    Co-evaluation    PT/OT/SLP Co-Evaluation/Treatment: Yes Reason for Co-Treatment: To address functional/ADL transfers;Complexity of  the patient's impairments (multi-system involvement);For patient/therapist safety PT goals addressed during session: Mobility/safety with mobility;Balance;Proper use of DME;Strengthening/ROM OT goals addressed during session: ADL's and self-care;Proper use of Adaptive equipment and DME      AM-PAC OT "6 Clicks" Daily Activity     Outcome Measure   Help from another person eating meals?: None Help from another person taking care of personal grooming?: A Little Help from another person toileting, which includes using toliet, bedpan, or urinal?: A Lot Help from another person bathing (including washing, rinsing, drying)?: A Lot Help from another person to put on and taking off regular upper body clothing?: A Little Help from another person to put on and taking off regular lower body clothing?: Total 6 Click Score: 15    End of Session Equipment Utilized During Treatment: Gait belt;Rolling walker  OT Visit Diagnosis: Unsteadiness on feet (R26.81);Other abnormalities of gait and mobility (R26.89);Muscle weakness (generalized) (M62.81);Pain Pain - part of body:  (abdomen)   Activity Tolerance Patient tolerated treatment well;Patient limited by fatigue   Patient Left in chair;with call bell/phone within reach;with chair alarm set;with family/visitor present   Nurse Communication          Time: 1610-9604 OT Time Calculation (min): 40 min  Charges: OT General Charges $OT Visit: 1 Visit OT Treatments $Self Care/Home Management : 8-22 mins     Galen Manila 03/10/2021, 2:36 PM

## 2021-03-11 LAB — CBC
HCT: 26.7 % — ABNORMAL LOW (ref 39.0–52.0)
Hemoglobin: 8.6 g/dL — ABNORMAL LOW (ref 13.0–17.0)
MCH: 28.9 pg (ref 26.0–34.0)
MCHC: 32.2 g/dL (ref 30.0–36.0)
MCV: 89.6 fL (ref 80.0–100.0)
Platelets: 234 10*3/uL (ref 150–400)
RBC: 2.98 MIL/uL — ABNORMAL LOW (ref 4.22–5.81)
RDW: 13.1 % (ref 11.5–15.5)
WBC: 12.1 10*3/uL — ABNORMAL HIGH (ref 4.0–10.5)
nRBC: 0 % (ref 0.0–0.2)

## 2021-03-11 LAB — RENAL FUNCTION PANEL
Albumin: 2.5 g/dL — ABNORMAL LOW (ref 3.5–5.0)
Anion gap: 6 (ref 5–15)
BUN: 33 mg/dL — ABNORMAL HIGH (ref 8–23)
CO2: 25 mmol/L (ref 22–32)
Calcium: 7.9 mg/dL — ABNORMAL LOW (ref 8.9–10.3)
Chloride: 108 mmol/L (ref 98–111)
Creatinine, Ser: 1.23 mg/dL (ref 0.61–1.24)
GFR, Estimated: 57 mL/min — ABNORMAL LOW (ref >60)
Glucose, Bld: 117 mg/dL — ABNORMAL HIGH (ref 70–99)
Phosphorus: 3.7 mg/dL (ref 2.5–4.6)
Potassium: 4.3 mmol/L (ref 3.5–5.1)
Sodium: 139 mmol/L (ref 135–145)

## 2021-03-11 LAB — GLUCOSE, CAPILLARY
Glucose-Capillary: 146 mg/dL — ABNORMAL HIGH (ref 70–99)
Glucose-Capillary: 145 mg/dL — ABNORMAL HIGH (ref 70–99)
Glucose-Capillary: 128 mg/dL — ABNORMAL HIGH (ref 70–99)

## 2021-03-11 MED ORDER — ENSURE MAX PROTEIN PO LIQD
11.0000 [oz_av] | Freq: Two times a day (BID) | ORAL | Status: DC
  Administered 2021-03-11 – 2021-03-14 (×7): 11 [oz_av] via ORAL
  Filled 2021-03-11 (×8): qty 330

## 2021-03-11 MED ORDER — TRAVASOL 10 % IV SOLN
INTRAVENOUS | Status: DC
  Filled 2021-03-11: qty 940.8

## 2021-03-11 NOTE — Progress Notes (Signed)
10 Days Post-Op   Subjective/Chief Complaint: Pain overall controlled. Tolerating FLD. +BM yesterday.   Objective: Vital signs in last 24 hours: Temp:  [98.4 F (36.9 C)-100.3 F (37.9 C)] 100.3 F (37.9 C) (05/14 0744) Pulse Rate:  [80-98] 94 (05/14 0744) Resp:  [20-31] 20 (05/14 0744) BP: (127-163)/(61-84) 153/70 (05/14 0744) SpO2:  [92 %-98 %] 93 % (05/14 0744) Weight:  [62.3 kg] 62.3 kg (05/14 0500) Last BM Date: 03/10/21  Intake/Output from previous day: 05/13 0701 - 05/14 0700 In: 2567.2 [P.O.:50; I.V.:2367.2; IV Piggyback:150] Out: 2375 [Urine:2375] Intake/Output this shift: No intake/output data recorded.  General appearance: alert and cooperative Resp: clear to auscultation bilaterally Cardio: regular rate and rhythm GI: soft, appropriately tender, some cellulitis inferior aspect of incision so I removed more staples and expressed cloudy hematoma. Room re-packed with moist-to-dry dressing.  Lab Results:  Recent Labs    03/10/21 0500 03/11/21 0500  WBC 13.5* 12.1*  HGB 8.7* 8.6*  HCT 26.9* 26.7*  PLT 219 234   BMET Recent Labs    03/10/21 0500 03/11/21 0500  NA 137 139  K 3.8 4.3  CL 105 108  CO2 25 25  GLUCOSE 118* 117*  BUN 29* 33*  CREATININE 1.12 1.23  CALCIUM 8.3* 7.9*   PT/INR No results for input(s): LABPROT, INR in the last 72 hours. ABG No results for input(s): PHART, HCO3 in the last 72 hours.  Invalid input(s): PCO2, PO2  Studies/Results: No results found.  Anti-infectives: Anti-infectives (From admission, onward)   Start     Dose/Rate Route Frequency Ordered Stop   03/01/21 1800  piperacillin-tazobactam (ZOSYN) IVPB 3.375 g        3.375 g 12.5 mL/hr over 240 Minutes Intravenous Every 8 hours 03/01/21 1732     02/26/21 1500  metroNIDAZOLE (FLAGYL) IVPB 500 mg  Status:  Discontinued        500 mg 100 mL/hr over 60 Minutes Intravenous Every 8 hours 02/26/21 1414 03/01/21 1732   02/22/21 2000  cefTRIAXone (ROCEPHIN) 2 g in  sodium chloride 0.9 % 100 mL IVPB  Status:  Discontinued        2 g 200 mL/hr over 30 Minutes Intravenous Daily at 10 pm 02/22/21 1339 03/01/21 1716   02/22/21 1600  metroNIDAZOLE (FLAGYL) IVPB 500 mg  Status:  Discontinued        500 mg 100 mL/hr over 60 Minutes Intravenous Every 8 hours 02/22/21 1339 02/26/21 1414      Assessment/Plan: s/p Procedure(s): EXPLORATORY LAPAROTOMY - PRIMARY REPAIR OF DUODENAL PERFORATION WITH GRAHAM PATCH;  INSERTION OF 19 FR. DRAIN (N/A) Advance diet. 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 Radiology recommend nonemergent vascular consultation  Elevated troponin- 32 >174 >112, EKG without ischemia changes,cards cleared for surgery E.coli, Enterobacterales bacteremia -onIV abx. Repeat Cx's with NGTD Pleural effusions - off o2 this morning. On lasix BID. Pulm toilet(brought IS into room today)  Acute biliary pancreatitis, suspect resolving ascending cholangitis, gangrenous cholecystitis  s/p lap chole with Dr. Dwain Sarna on 5/1 - POD#12 - operative findings of liver cysts and cirrhosis- will need PCP/GI follow up - Path:Acute and chronic cholecystitis with cholelithiasis - No fluid collection identified in the gallbladder fossa on CT 5/10 - LFT wnl 5/12  Duodenal perforation s/p exp lap with primary repair of duodenal perforation with [name] patch - Dr. Freida Busman 5/4 POD #10 - AFVSS,WBC 14 > 13 > 12. Trend -  UGInegative 5/9. Follow up xray with contrast that passed into colon. Having bowel function but still having distension and pain. Discussed with MD. Will give trial of CLD  - CT A/P 5/10 w/ multiple fluid loculations in the ventral abdomen w/ largest measuring 5.1 x 3.7 cm. Reviewed with MD. Fluid collection appear near surgical drain. Did not recommend IR consultation. Will likely need repeat CT scan in next few days to determine if this is developing  into a drainable abscess.  - BID WTD for midline wound. - PT/OT - recommending home with HH -Continue JP and monitor - TPN - Cont IV abx  FEN: CLD, TPN. Follow prealb. ID: ceftriaxone 4/27>5/3, flagyl 4/26>5/4, zosyn 5/4>> Foley: Removed5/6., Voiding. VTE: SCDs,Lovenox  LOS: 17 days    Adam Phenix 03/11/2021

## 2021-03-11 NOTE — Progress Notes (Signed)
PROGRESS NOTE  [place] BTD:[id number] DOB: Jul 07, 1931 DOA: 02/22/2021 PCP: Pcp, No   LOS: 17 days   Brief narrative:  Travis Gallagher is a 87 y.o. male with medical history significant for hypertension, GERD was admitted to hospital as transfer from [facility] for management of acute pancreatitis in the setting of potential choledocholithiasis.  Patient was also found to have acute biliary pancreatitis.  GI was initially consulted but MRCP did not show choledocholithiasis.  Patient underwent laparoscopic cholecystectomy on 02/26/2021.  Cardiology was consulted for preop risk stratification.    During hospitalization, post -operatively patient continued to have abdominal pain and distention with renal failure.  He was noted to have duodenal perforation and biliary leak so underwent relaparotomy on 03/01/2021.  After relaparotomy patient had briefly had an NG tube placed in.  Upper GI study was performed on 03/06/2021.  Patient did have a spikes of fever so CT scan of the abdomen chest has been ordered.  Patient has remained on TPN.   Assessment/Plan:    Principal Problem:   Acute pancreatitis Active Problems:   Severe sepsis (HCC)   Cholangitis   Abdominal pain   Nausea   Hypertension   GERD (gastroesophageal reflux disease)   Elevated troponin   Protein-calorie malnutrition, severe  Acute biliary pancreatitis:    Patient underwent laparoscopic cholecystectomy by general surgery on 02/26/2021.  Postoperatively, patient continued to have increasing pain and distention and renal failure.  CT scan of the abdomen was performed which showed signs of biliary leak.  Patient was then taken back to the OR on 03/01/2021 and was noted to have a duodenal perforation.  He underwent laparotomy with primary repair of duodenal perforation with Phillip Heal patch by Dr. Zenia Resides on 03/01/21.  Patient underwent upper GI study on 03/06/2021 without note of extravasation.  Contrast flow was demonstrated to  the colon. Continue TPN for nutrition.  Diet advanced to soft foods.  Fever.  ?  If related to atelectasis.  CT of the chest abdomen pelvis did not show any evidence of PE.  Initial blood culture on admission was positive for E. coli, but repeat cultures have not shown any growth. There was mention of fluid collection on CT of abdomen near surgical site, may need repeat CT in next few days to monitor for developing abscess  Duodenal perforation, biliary leak.  Status post exploratory laparotomy with repair of duodenal perforation on 03/01/2021. Currently patient is on full liquids and on TPN.  Mild leukocytosis.  Per surgery, diet has been advanced to soft foods. We will continue to monitor.  CBC Latest Ref Rng & Units 03/11/2021 03/10/2021 03/09/2021  WBC 4.0 - 10.5 K/uL 12.1(H) 13.5(H) 14.8(H)  Hemoglobin 13.0 - 17.0 g/dL 8.6(L) 8.7(L) 9.9(L)  Hematocrit 39.0 - 52.0 % 26.7(L) 26.9(L) 31.4(L)  Platelets 150 - 400 K/uL 234 219 226    Liver cyst and cirrhosis during surgical intervention.  Will need outpatient PCP and GI follow-up.    Severe sepsis/enterobacter and E. coli bacteremia secondary to ascending cholangitis, gangrenous cholecystitis Currently on IV Zosyn, secondary to duodenal perforation.  Patient initially met sepsis criteria on admission. CT of the chest, abdomen and pelvis was negative for pulmonary embolism, also showed multiple fluid collections in the ventral abdomen related to possible seromas versus abscess.  At this point, per surgery he does not need further antibiotics from surgery standpoint. His bacteremia has been adequately treated, so will discontinue further zosyn and observe.   Elevated troponin with LV systolic dysfunction,  acute on chronic combined congestive heart failure. EKG did not show any ischemic changes.  Possibly secondary to demand ischemia . 2D echocardiogram with left ventricular ejection fraction of 40 to 45% with diffuse hypokinesis and abnormal septal  motion wall.  Cardiology followed the patient during hospitalization.  Aspirin on hold due to recent postop status.  Will need outpatient follow-up with cardiology. Continue on coreg. He has been receiving IV lasix with good urine output. May need to discharge with oral diuretics   Mild Dilation of acending aorta Follow-up with vascular surgery as outpatient.    PSVT- 2D echo with EF of 45 to 50%.  Continue on coreg.  Will need repeat TSH follow-up as outpatient.   Acute kidney injury Improved.  Latest creatinine of 1.2   Essential hypertension:  Not on antihypertensives at home.  Currently on coreg.  IV hydralazine has been added for better control.  Continue to monitor.     GERD: Continue IV PPI   Debility, weakness.  Physical therapy has seen the patient and recommend home health PT on discharge.  Encouraged ambulation.    Hypophosphatemia.   Resolved after replacement  Anasarca/bilateral pleural effusions -Patient noted to have significant hypoalbuminemia -he has had good urine output with lasix (had 2.3L out yesterday) -continue lasix for another 24 hours    DVT prophylaxis: Place TED hose Start: 03/03/21 1742 enoxaparin (LOVENOX) injection 40 mg Start: 03/03/21 1000 Place and maintain sequential compression device Start: 02/23/21 1520 SCDs Start: 02/22/21 1225   Code Status: Full code  Family Communication:  Discussed with patient via interpreter  Status is: Inpatient  Remains inpatient appropriate because: IV treatments appropriate due to intensity of illness or inability to take PO and Inpatient level of care appropriate due to severity of illness, status post exploratory laparotomy, parenteral nutrition, IV antibiotics.  Dispo: The patient is from: Home              Anticipated d/c is to: Home with home health              Patient currently is not medically stable to d/c.   Difficult to place patient No  Consultants:  GI  Cardiology  General  surgery  Procedures:  Laparoscopic cholecystectomy 02/26/2021  Exploratory laparotomy with primary repair of duodenal perforation with Phillip Heal patch on 03/01/2021  NG tube placement and removal.  Right upper extremity PICC placement on 03/02/2021  Anti-infectives:  Marland Kitchen Ceftriaxone and metronidazole IV 4/27>5/4 . Zosyn 03/01/2021>5/14  Anti-infectives (From admission, onward)   Start     Dose/Rate Route Frequency Ordered Stop   03/01/21 1800  piperacillin-tazobactam (ZOSYN) IVPB 3.375 g  Status:  Discontinued        3.375 g 12.5 mL/hr over 240 Minutes Intravenous Every 8 hours 03/01/21 1732 03/11/21 1247   02/26/21 1500  metroNIDAZOLE (FLAGYL) IVPB 500 mg  Status:  Discontinued        500 mg 100 mL/hr over 60 Minutes Intravenous Every 8 hours 02/26/21 1414 03/01/21 1732   02/22/21 2000  cefTRIAXone (ROCEPHIN) 2 g in sodium chloride 0.9 % 100 mL IVPB  Status:  Discontinued        2 g 200 mL/hr over 30 Minutes Intravenous Daily at 10 pm 02/22/21 1339 03/01/21 1716   02/22/21 1600  metroNIDAZOLE (FLAGYL) IVPB 500 mg  Status:  Discontinued        500 mg 100 mL/hr over 60 Minutes Intravenous Every 8 hours 02/22/21 1339 02/26/21 1414     Subjective: Had  a bowel movement yesterday. Still has some soreness in abdomen, but is feeling better. No shortness of breath  Objective: Vitals:   03/11/21 0744 03/11/21 1116  BP: (!) 153/70 (!) 108/57  Pulse: 94 90  Resp: 20 19  Temp: 100.3 F (37.9 C) 100 F (37.8 C)  SpO2: 93% 94%    Intake/Output Summary (Last 24 hours) at 03/11/2021 1247 Last data filed at 03/11/2021 1117 Gross per 24 hour  Intake 2567.23 ml  Output 2460 ml  Net 107.23 ml   Filed Weights   03/09/21 0426 03/10/21 0535 03/11/21 0500  Weight: 65.7 kg 61.4 kg 62.3 kg   Body mass index is 21.51 kg/m.   Physical Exam: General exam: Alert, awake, oriented x 3 Respiratory system: crackles at bases. Respiratory effort normal. Cardiovascular system:RRR. No murmurs, rubs,  gallops. Gastrointestinal system: Abdomen is distended, soft and tender over incision sites. No organomegaly or masses felt. Normal bowel sounds heard. Central nervous system: Alert and oriented. No focal neurological deficits. Extremities: 1+ lower extremity edema, improving Skin: No rashes, lesions or ulcers Psychiatry: Judgement and insight appear normal. Mood & affect appropriate.    Data Review: I have personally reviewed the following laboratory data and studies,  CBC: Recent Labs  Lab 03/06/21 0300 03/07/21 0740 03/08/21 0339 03/09/21 1054 03/10/21 0500 03/11/21 0500  WBC 13.5* 13.2* 14.3* 14.8* 13.5* 12.1*  NEUTROABS 11.3*  --   --   --   --   --   HGB 10.2* 9.5* 9.4* 9.9* 8.7* 8.6*  HCT 31.0* 29.4* 28.9* 31.4* 26.9* 26.7*  MCV 89.1 93.6 90.6 92.1 89.7 89.6  PLT 207 215 220 226 219 657   Basic Metabolic Panel: Recent Labs  Lab 03/05/21 0435 03/06/21 0300 03/07/21 1213 03/08/21 0339 03/09/21 0451 03/10/21 0500 03/11/21 0500  NA 137 134* 137 139 138 137 139  K 3.9 3.9 3.9 3.9 3.8 3.8 4.3  CL 104 102 105 108 109 105 108  CO2 '29 27 28 26 25 25 25  ' GLUCOSE 155* 141* 136* 119* 126* 118* 117*  BUN 23 24* 26* 27* 29* 29* 33*  CREATININE 1.00 0.93 0.98 0.96 1.07 1.12 1.23  CALCIUM 7.4* 7.3* 7.5* 7.5* 7.8* 8.3* 7.9*  MG 1.8 2.0 2.1  --  2.2  --   --   PHOS 2.3* 3.2 3.0  --  3.3 3.3 3.7   Liver Function Tests: Recent Labs  Lab 03/05/21 0435 03/06/21 0300 03/07/21 1213 03/08/21 0339 03/09/21 0451 03/10/21 0500 03/11/21 0500  AST '21 19 29 26 31  ' --   --   ALT '12 13 18 19 24  ' --   --   ALKPHOS 106 111 100 90 85  --   --   BILITOT 0.5 0.4 0.2* 0.7 0.6  --   --   PROT 4.8* 4.8* 5.1* 5.0* 5.5*  --   --   ALBUMIN 1.7* 1.6* 1.7* 1.6* 2.1* 3.0* 2.5*   No results for input(s): LIPASE, AMYLASE in the last 168 hours. No results for input(s): AMMONIA in the last 168 hours. Cardiac Enzymes: No results for input(s): CKTOTAL, CKMB, CKMBINDEX, TROPONINI in the last 168  hours. BNP (last 3 results) No results for input(s): BNP in the last 8760 hours.  ProBNP (last 3 results) No results for input(s): PROBNP in the last 8760 hours.  CBG: Recent Labs  Lab 03/10/21 1410 03/10/21 1614 03/10/21 2128 03/11/21 0603 03/11/21 1115  GLUCAP 144* 137* 143* 146* 145*   Recent Results (from the  past 240 hour(s))  Culture, blood (Routine X 2) w Reflex to ID Panel     Status: None   Collection Time: 03/05/21  5:30 PM   Specimen: BLOOD  Result Value Ref Range Status   Specimen Description BLOOD LEFT ANTECUBITAL  Final   Special Requests   Final    BOTTLES DRAWN AEROBIC ONLY Blood Culture adequate volume   Culture   Final    NO GROWTH 5 DAYS Performed at [facility], [address], [place], [place] [address]    Report Status 03/10/2021 FINAL  Final  Culture, blood (Routine X 2) w Reflex to ID Panel     Status: None   Collection Time: 03/05/21  5:36 PM   Specimen: BLOOD LEFT WRIST  Result Value Ref Range Status   Specimen Description BLOOD LEFT WRIST  Final   Special Requests   Final    BOTTLES DRAWN AEROBIC ONLY Blood Culture adequate volume   Culture   Final    NO GROWTH 5 DAYS Performed at [facility], [place] [address] [place], [place] [address]    Report Status 03/10/2021 FINAL  Final     Studies: No results found.   Kathie Dike, MD  [facility] 03/11/2021  If 7PM-7AM, please contact night-coverage

## 2021-03-11 NOTE — Progress Notes (Addendum)
PHARMACY - TOTAL PARENTERAL NUTRITION CONSULT NOTE  Indication: Prolonged ileus  Patient Measurements: Weight: 62.3 kg (137 lb 5.6 oz)   Body mass index is 21.51 kg/m.  Assessment:  74 YOM presented to [facility] with abdominal pain, found to have acute pancreatitis in setting of choledocholithiasis and transferred to [facility] on 02/22/21. Underwent ex-lap for gallstone pancreatitis on 5/1. Had contrast extravasation from proximal duodenum with bilious output and underwent ex-lap with repair of duodenal perforation with [name] patch on 5/4. Patient has inadequate nutrition for 9 days, and Pharmacy consulted to dose TPN.  Patient has been on a clear liquid diet with intermittent NPO status. Diet advanced to soft post-surgery on 5/2 but soon reverted to NPO for second surgery on 5/4. Per documentation, no recent weight changes and patient was eating well PTA.  Glucose / Insulin: no hx DM - CBGs < 180. Utilized 3 units SSI in last 24hrs Electrolytes: K 4.3 with Lasix (goal >/= 4), others WNL Renal: SCr up 1.23, BUN 33.  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 with large burden of mural thrombus GI Surgeries / Procedures:  5/10 - evacuated hematoma at top of midline wound  Central access:  PICC placed 03/02/21 TPN start date: 03/03/21  Nutritional Goals (per RD rec on 5/5): 1900-2100 kCal, 90-105g AA, and >1.9L fluid per day  Current Nutrition:  TPN FLD started 5/13 - fair appetite documented Ensure Enlive TID (each provides 350 kcal and 20 g  protein) - 2 charted given Magic cup TID with meals (each provides 290 kcal and 9 g protein)   Plan:  Continue TPN at goal rate 80 ml/hr to provide 94g AA and 1951 kCal, meeting 100% of needs Electrolytes in TPN: Na 15mEq/L, K 42mEq/L, Ca 1mEq/L, Mag 49mEq/L, Phos 27mmol/L, Cl:Ac 1:2 Add multivitamin and trace elements to TPN Discontinue sensitive SSI  If no oral diet/EN soon, consider cycling TPN BMET in AM.  Standard TPN labs on Mon and Thurs  Thank you for involving pharmacy in this patient's care.  Thu, PharmD, BCPS Clinical Pharmacist Clinical phone for 03/11/2021 until 3p is 03/13/2021 03/11/2021 7:10 AM  **Pharmacist phone directory can be found on [email or web address] listed under [facility]**  Addendum: Consulted to reduce to half rate TPN. Spoke with [facility], Marisue Ivan with surgery. In light of TPN component shortages, will stop TPN and if he does not tolerate diet, will attempt enteral nutrition. Could continue half rate TPN until 18:00 if needed today.  [place], PharmD, BCPS 11:07 AM

## 2021-03-12 LAB — CBC
HCT: 27.2 % — ABNORMAL LOW (ref 39.0–52.0)
Hemoglobin: 8.9 g/dL — ABNORMAL LOW (ref 13.0–17.0)
MCH: 29.4 pg (ref 26.0–34.0)
MCHC: 32.7 g/dL (ref 30.0–36.0)
MCV: 89.8 fL (ref 80.0–100.0)
Platelets: 254 10*3/uL (ref 150–400)
RBC: 3.03 MIL/uL — ABNORMAL LOW (ref 4.22–5.81)
RDW: 13.2 % (ref 11.5–15.5)
WBC: 13.1 10*3/uL — ABNORMAL HIGH (ref 4.0–10.5)
nRBC: 0 % (ref 0.0–0.2)

## 2021-03-12 LAB — RENAL FUNCTION PANEL
Albumin: 2.4 g/dL — ABNORMAL LOW (ref 3.5–5.0)
Anion gap: 6 (ref 5–15)
BUN: 42 mg/dL — ABNORMAL HIGH (ref 8–23)
CO2: 24 mmol/L (ref 22–32)
Calcium: 7.8 mg/dL — ABNORMAL LOW (ref 8.9–10.3)
Chloride: 108 mmol/L (ref 98–111)
Creatinine, Ser: 1.31 mg/dL — ABNORMAL HIGH (ref 0.61–1.24)
GFR, Estimated: 53 mL/min — ABNORMAL LOW (ref >60)
Glucose, Bld: 95 mg/dL (ref 70–99)
Phosphorus: 4.2 mg/dL (ref 2.5–4.6)
Potassium: 3.9 mmol/L (ref 3.5–5.1)
Sodium: 138 mmol/L (ref 135–145)

## 2021-03-12 MED ORDER — PANTOPRAZOLE SODIUM 40 MG PO TBEC
40.0000 mg | DELAYED_RELEASE_TABLET | Freq: Two times a day (BID) | ORAL | Status: DC
  Administered 2021-03-12 – 2021-03-14 (×5): 40 mg via ORAL
  Filled 2021-03-12 (×5): qty 1

## 2021-03-12 NOTE — Progress Notes (Signed)
Patient arrived from [facility] on external pacer box. No order present. On-call MD called and verbal order received to continue DDD,80 bpm, 13 sensitivity.

## 2021-03-12 NOTE — Progress Notes (Addendum)
11 Days Post-Op   Subjective/Chief Complaint: Mild-mod abd pain. Tolerating soft diet without nausea or vomiting. Having bowel function.  TMAX 100.8 yesterday afternoon Objective: Vital signs in last 24 hours: Temp:  [97.9 F (36.6 C)-100.8 F (38.2 C)] 99.3 F (37.4 C) (05/15 0742) Pulse Rate:  [85-97] 87 (05/15 0742) Resp:  [16-20] 20 (05/15 0742) BP: (108-131)/(55-63) 131/58 (05/15 0742) SpO2:  [92 %-94 %] 92 % (05/15 0742) Weight:  [61.7 kg] 61.7 kg (05/15 0500) Last BM Date: 03/10/21  Intake/Output from previous day: 05/14 0701 - 05/15 0700 In: 744.9 [I.V.:613.7; IV Piggyback:131.1] Out: 2390 [Urine:2390] Intake/Output this shift: No intake/output data recorded.  General appearance: alert and cooperative Resp: clear to auscultation bilaterally Cardio: regular rate and rhythm GI: soft, appropriately tender. Wound examined and looks clean. Cellulitis improved. re-packed with moist-to-dry dressing.  Lab Results:  Recent Labs    03/11/21 0500 03/12/21 0810  WBC 12.1* 13.1*  HGB 8.6* 8.9*  HCT 26.7* 27.2*  PLT 234 254   BMET Recent Labs    03/10/21 0500 03/11/21 0500  NA 137 139  K 3.8 4.3  CL 105 108  CO2 25 25  GLUCOSE 118* 117*  BUN 29* 33*  CREATININE 1.12 1.23  CALCIUM 8.3* 7.9*   PT/INR No results for input(s): LABPROT, INR in the last 72 hours. ABG No results for input(s): PHART, HCO3 in the last 72 hours.  Invalid input(s): PCO2, PO2  Studies/Results: No results found.  Anti-infectives: Anti-infectives (From admission, onward)   Start     Dose/Rate Route Frequency Ordered Stop   03/01/21 1800  piperacillin-tazobactam (ZOSYN) IVPB 3.375 g  Status:  Discontinued        3.375 g 12.5 mL/hr over 240 Minutes Intravenous Every 8 hours 03/01/21 1732 03/11/21 1247   02/26/21 1500  metroNIDAZOLE (FLAGYL) IVPB 500 mg  Status:  Discontinued        500 mg 100 mL/hr over 60 Minutes Intravenous Every 8 hours 02/26/21 1414 03/01/21 1732   02/22/21  2000  cefTRIAXone (ROCEPHIN) 2 g in sodium chloride 0.9 % 100 mL IVPB  Status:  Discontinued        2 g 200 mL/hr over 30 Minutes Intravenous Daily at 10 pm 02/22/21 1339 03/01/21 1716   02/22/21 1600  metroNIDAZOLE (FLAGYL) IVPB 500 mg  Status:  Discontinued        500 mg 100 mL/hr over 60 Minutes Intravenous Every 8 hours 02/22/21 1339 02/26/21 1414      Assessment/Plan: s/p Procedure(s): EXPLORATORY LAPAROTOMY - PRIMARY REPAIR OF DUODENAL PERFORATION WITH GRAHAM PATCH;  INSERTION OF 19 FR. DRAIN (N/A) Advance diet. 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 Radiology recommend nonemergent vascular consultation  Elevated troponin- 32 >174 >112, EKG without ischemia changes,cards cleared for surgery E.coli, Enterobacterales bacteremia -completed abx. Repeat Cx's with NGTD Pleural effusions - off o2 this morning. On lasix BID. Pulm toilet Acute biliary pancreatitis, suspect resolving ascending cholangitis, gangrenous cholecystitis  s/p lap chole with Dr. Dwain Sarna on 5/1 - POD#14 - operative findings of liver cysts and cirrhosis- will need PCP/GI follow up - Path:Acute and chronic cholecystitis with cholelithiasis - No fluid collection identified in the gallbladder fossa on CT 5/10 - LFT wnl 5/12  Duodenal perforation s/p exp lap with primary repair of duodenal perforation with [name] patch - Dr. Freida Busman 5/4 POD #11 - AFVSS,WBC 14 > 13 > 12 > 13 Trend -  UGInegative 5/9. Follow up xray with contrast that passed into colon. Having bowel function but still having distension and pain. Discussed with MD. Will give trial of CLD  - CT A/P 5/10 w/ multiple fluid loculations in the ventral abdomen w/ largest measuring 5.1 x 3.7 cm. Reviewed with MD. Fluid collection appear near surgical drain. Did not recommend IR consultation. Will likely need repeat CT scan in next few days to determine if this is  developing into a drainable abscess.  - BID WTD for midline wound. - PT/OT - recommending home with HH -Continue JP and monitor  FEN: soft, ensure. Encourage PO intake  ID: ceftriaxone 4/27>5/3, flagyl 4/26>5/4, zosyn 5/4>> Foley: Removed5/6., Voiding. VTE: SCDs,Lovenox  From a surgical perspective if patient continues to clinically improve and tolerating PO he could likely go home with [facility] in 1-2 days.   LOS: 18 days    Adam Phenix 03/12/2021

## 2021-03-12 NOTE — Progress Notes (Signed)
PROGRESS NOTE  Travis Gallagher ZOX:[id number] DOB: [birth year] DOA: 02/22/2021 PCP: Pcp, No   LOS: 18 days   Brief narrative:  Travis Gallagher is a 87 y.o. male with medical history significant for hypertension, GERD was admitted to hospital as transfer from [facility] for management of acute pancreatitis in the setting of potential choledocholithiasis.  Patient was also found to have acute biliary pancreatitis.  GI was initially consulted but MRCP did not show choledocholithiasis.  Patient underwent laparoscopic cholecystectomy on 02/26/2021.  Cardiology was consulted for preop risk stratification.    During hospitalization, post -operatively patient continued to have abdominal pain and distention with renal failure.  He was noted to have duodenal perforation and biliary leak so underwent relaparotomy on 03/01/2021.  After relaparotomy patient had briefly had an NG tube placed in.  Upper GI study was performed on 03/06/2021.  Patient did have a spikes of fever so CT scan of the abdomen chest has been ordered.  Patient has remained on TPN.   Assessment/Plan:    Principal Problem:   Acute pancreatitis Active Problems:   Severe sepsis (HCC)   Cholangitis   Abdominal pain   Nausea   Hypertension   GERD (gastroesophageal reflux disease)   Elevated troponin   Protein-calorie malnutrition, severe  Acute biliary pancreatitis:    Patient underwent laparoscopic cholecystectomy by general surgery on 02/26/2021.  Postoperatively, patient continued to have increasing pain and distention and renal failure.  CT scan of the abdomen was performed which showed signs of biliary leak.  Patient was then taken back to the OR on 03/01/2021 and was noted to have a duodenal perforation.  He underwent laparotomy with primary repair of duodenal perforation with Phillip Heal patch by Dr. Zenia Resides on 03/01/21.  Patient underwent upper GI study on 03/06/2021 without note of extravasation.  Contrast flow was demonstrated to  the colon. Continue TPN for nutrition.  Diet advanced to soft foods.  Fever.  ?  If related to atelectasis.  CT of the chest abdomen pelvis did not show any evidence of PE.  Initial blood culture on admission was positive for E. coli, but repeat cultures have not shown any growth. There was mention of fluid collection on CT of abdomen near surgical site, may need repeat CT in next few days to monitor for developing abscess  Duodenal perforation, biliary leak.  Status post exploratory laparotomy with repair of duodenal perforation on 03/01/2021. Currently patient is on full liquids and on TPN.  Mild leukocytosis.  Per surgery, diet has been advanced to soft foods. We will continue to monitor.  CBC Latest Ref Rng & Units 03/12/2021 03/11/2021 03/10/2021  WBC 4.0 - 10.5 K/uL 13.1(H) 12.1(H) 13.5(H)  Hemoglobin 13.0 - 17.0 g/dL 8.9(L) 8.6(L) 8.7(L)  Hematocrit 39.0 - 52.0 % 27.2(L) 26.7(L) 26.9(L)  Platelets 150 - 400 K/uL 254 234 219    Liver cyst and cirrhosis during surgical intervention.  Will need outpatient PCP and GI follow-up.    Severe sepsis/enterobacter and E. coli bacteremia secondary to ascending cholangitis, gangrenous cholecystitis Currently on IV Zosyn, secondary to duodenal perforation.  Patient initially met sepsis criteria on admission. CT of the chest, abdomen and pelvis was negative for pulmonary embolism, also showed multiple fluid collections in the ventral abdomen related to possible seromas versus abscess.  At this point, per surgery he does not need further antibiotics from surgery standpoint. His bacteremia had been adequately treated, so zosyn was discontinued.   Elevated troponin with LV systolic dysfunction, acute on chronic  combined congestive heart failure. EKG did not show any ischemic changes.  Possibly secondary to demand ischemia . 2D echocardiogram with left ventricular ejection fraction of 40 to 45% with diffuse hypokinesis and abnormal septal motion wall.  Cardiology  followed the patient during hospitalization.  Aspirin on hold due to recent postop status.  Will need outpatient follow-up with cardiology. Continue on coreg. He has been receiving IV lasix with good urine output. May need to discharge with oral diuretics   Mild Dilation of acending aorta Follow-up with vascular surgery as outpatient.    PSVT- 2D echo with EF of 45 to 50%.  Continue on coreg.  Will need repeat TSH follow-up as outpatient.   Acute kidney injury Improved.  Latest creatinine of 1.2   Essential hypertension:  Not on antihypertensives at home.  Currently on coreg.  IV hydralazine has been added for better control.  Continue to monitor.     GERD: Continue IV PPI   Debility, weakness.  Physical therapy has seen the patient and recommend home health PT on discharge.  Encouraged ambulation.    Hypophosphatemia.   Resolved after replacement  Anasarca/bilateral pleural effusions -Patient noted to have significant hypoalbuminemia -he has had good urine output with lasix (had 2.3L out yesterday) -since bun/cr are trending up, will hold further lasix    DVT prophylaxis: Place TED hose Start: 03/03/21 1742 enoxaparin (LOVENOX) injection 40 mg Start: 03/03/21 1000 Place and maintain sequential compression device Start: 02/23/21 1520 SCDs Start: 02/22/21 1225   Code Status: Full code  Family Communication:  Discussed with patient via interpreter  Status is: Inpatient  Remains inpatient appropriate because: IV treatments appropriate due to intensity of illness or inability to take PO and Inpatient level of care appropriate due to severity of illness, status post exploratory laparotomy, parenteral nutrition, IV antibiotics.  Dispo: The patient is from: Home              Anticipated d/c is to: Home with home health              Patient currently is not medically stable to d/c.   Difficult to place patient No  Consultants:  GI  Cardiology  General  surgery  Procedures:  Laparoscopic cholecystectomy 02/26/2021  Exploratory laparotomy with primary repair of duodenal perforation with Phillip Heal patch on 03/01/2021  NG tube placement and removal.  Right upper extremity PICC placement on 03/02/2021  Anti-infectives:  Marland Kitchen Ceftriaxone and metronidazole IV 4/27>5/4 . Zosyn 03/01/2021>5/14  Anti-infectives (From admission, onward)   Start     Dose/Rate Route Frequency Ordered Stop   03/01/21 1800  piperacillin-tazobactam (ZOSYN) IVPB 3.375 g  Status:  Discontinued        3.375 g 12.5 mL/hr over 240 Minutes Intravenous Every 8 hours 03/01/21 1732 03/11/21 1247   02/26/21 1500  metroNIDAZOLE (FLAGYL) IVPB 500 mg  Status:  Discontinued        500 mg 100 mL/hr over 60 Minutes Intravenous Every 8 hours 02/26/21 1414 03/01/21 1732   02/22/21 2000  cefTRIAXone (ROCEPHIN) 2 g in sodium chloride 0.9 % 100 mL IVPB  Status:  Discontinued        2 g 200 mL/hr over 30 Minutes Intravenous Daily at 10 pm 02/22/21 1339 03/01/21 1716   02/22/21 1600  metroNIDAZOLE (FLAGYL) IVPB 500 mg  Status:  Discontinued        500 mg 100 mL/hr over 60 Minutes Intravenous Every 8 hours 02/22/21 1339 02/26/21 1414     Subjective: Tolerating  diet, had a small bowel movement yesterday. Pain is controlled  Objective: Vitals:   03/12/21 0742 03/12/21 1124  BP: (!) 131/58 (!) 108/52  Pulse: 87 84  Resp: 20 20  Temp: 99.3 F (37.4 C) 98.7 F (37.1 C)  SpO2: 92% 93%    Intake/Output Summary (Last 24 hours) at 03/12/2021 1219 Last data filed at 03/12/2021 0500 Gross per 24 hour  Intake 744.86 ml  Output 1580 ml  Net -835.14 ml   Filed Weights   03/10/21 0535 03/11/21 0500 03/12/21 0500  Weight: 61.4 kg 62.3 kg 61.7 kg   Body mass index is 21.3 kg/m.   Physical Exam: General exam: Alert, awake, oriented x 3 Respiratory system: clear bilaterally. Respiratory effort normal. Cardiovascular system:RRR. No murmurs, rubs, gallops. Gastrointestinal system: Abdomen is  distended, soft and tender over incision sites. No organomegaly or masses felt. Normal bowel sounds heard. Central nervous system: Alert and oriented. No focal neurological deficits. Extremities: no edema in LE bilaterally Skin: No rashes, lesions or ulcers Psychiatry: Judgement and insight appear normal. Mood & affect appropriate.    Data Review: I have personally reviewed the following laboratory data and studies,  CBC: Recent Labs  Lab 03/06/21 0300 03/07/21 0740 03/08/21 0339 03/09/21 1054 03/10/21 0500 03/11/21 0500 03/12/21 0810  WBC 13.5*   < > 14.3* 14.8* 13.5* 12.1* 13.1*  NEUTROABS 11.3*  --   --   --   --   --   --   HGB 10.2*   < > 9.4* 9.9* 8.7* 8.6* 8.9*  HCT 31.0*   < > 28.9* 31.4* 26.9* 26.7* 27.2*  MCV 89.1   < > 90.6 92.1 89.7 89.6 89.8  PLT 207   < > 220 226 219 234 254   < > = values in this interval not displayed.   Basic Metabolic Panel: Recent Labs  Lab 03/06/21 0300 03/07/21 1213 03/08/21 0339 03/09/21 0451 03/10/21 0500 03/11/21 0500 03/12/21 0810  NA 134* 137 139 138 137 139 138  K 3.9 3.9 3.9 3.8 3.8 4.3 3.9  CL 102 105 108 109 105 108 108  CO2 '27 28 26 25 25 25 24  ' GLUCOSE 141* 136* 119* 126* 118* 117* 95  BUN 24* 26* 27* 29* 29* 33* 42*  CREATININE 0.93 0.98 0.96 1.07 1.12 1.23 1.31*  CALCIUM 7.3* 7.5* 7.5* 7.8* 8.3* 7.9* 7.8*  MG 2.0 2.1  --  2.2  --   --   --   PHOS 3.2 3.0  --  3.3 3.3 3.7 4.2   Liver Function Tests: Recent Labs  Lab 03/06/21 0300 03/07/21 1213 03/08/21 0339 03/09/21 0451 03/10/21 0500 03/11/21 0500 03/12/21 0810  AST '19 29 26 31  ' --   --   --   ALT '13 18 19 24  ' --   --   --   ALKPHOS 111 100 90 85  --   --   --   BILITOT 0.4 0.2* 0.7 0.6  --   --   --   PROT 4.8* 5.1* 5.0* 5.5*  --   --   --   ALBUMIN 1.6* 1.7* 1.6* 2.1* 3.0* 2.5* 2.4*   No results for input(s): LIPASE, AMYLASE in the last 168 hours. No results for input(s): AMMONIA in the last 168 hours. Cardiac Enzymes: No results for input(s):  CKTOTAL, CKMB, CKMBINDEX, TROPONINI in the last 168 hours. BNP (last 3 results) No results for input(s): BNP in the last 8760 hours.  ProBNP (last 3 results)  No results for input(s): PROBNP in the last 8760 hours.  CBG: Recent Labs  Lab 03/10/21 1614 03/10/21 2128 03/11/21 0603 03/11/21 1115 03/11/21 1759  GLUCAP 137* 143* 146* 145* 128*   Recent Results (from the past 240 hour(s))  Culture, blood (Routine X 2) w Reflex to ID Panel     Status: None   Collection Time: 03/05/21  5:30 PM   Specimen: BLOOD  Result Value Ref Range Status   Specimen Description BLOOD LEFT ANTECUBITAL  Final   Special Requests   Final    BOTTLES DRAWN AEROBIC ONLY Blood Culture adequate volume   Culture   Final    NO GROWTH 5 DAYS Performed at [facility], [place] [address] [place], [place] [address]    Report Status 03/10/2021 FINAL  Final  Culture, blood (Routine X 2) w Reflex to ID Panel     Status: None   Collection Time: 03/05/21  5:36 PM   Specimen: BLOOD LEFT WRIST  Result Value Ref Range Status   Specimen Description BLOOD LEFT WRIST  Final   Special Requests   Final    BOTTLES DRAWN AEROBIC ONLY Blood Culture adequate volume   Culture   Final    NO GROWTH 5 DAYS Performed at [facility], [place] [address], [place], [place] [address]    Report Status 03/10/2021 FINAL  Final     Studies: No results found.   Kathie Dike, MD  [facility] 03/12/2021  If 7PM-7AM, please contact night-coverage

## 2021-03-13 DIAGNOSIS — E43 Unspecified severe protein-calorie malnutrition: Secondary | ICD-10-CM

## 2021-03-13 LAB — CBC
HCT: 27.9 % — ABNORMAL LOW (ref 39.0–52.0)
Hemoglobin: 9 g/dL — ABNORMAL LOW (ref 13.0–17.0)
MCH: 29.3 pg (ref 26.0–34.0)
MCHC: 32.3 g/dL (ref 30.0–36.0)
MCV: 90.9 fL (ref 80.0–100.0)
Platelets: 267 10*3/uL (ref 150–400)
RBC: 3.07 MIL/uL — ABNORMAL LOW (ref 4.22–5.81)
RDW: 13.1 % (ref 11.5–15.5)
WBC: 10.2 10*3/uL (ref 4.0–10.5)
nRBC: 0 % (ref 0.0–0.2)

## 2021-03-13 LAB — RENAL FUNCTION PANEL
Albumin: 2.3 g/dL — ABNORMAL LOW (ref 3.5–5.0)
Anion gap: 7 (ref 5–15)
BUN: 45 mg/dL — ABNORMAL HIGH (ref 8–23)
CO2: 23 mmol/L (ref 22–32)
Calcium: 7.8 mg/dL — ABNORMAL LOW (ref 8.9–10.3)
Chloride: 110 mmol/L (ref 98–111)
Creatinine, Ser: 1.25 mg/dL — ABNORMAL HIGH (ref 0.61–1.24)
GFR, Estimated: 56 mL/min — ABNORMAL LOW (ref >60)
Glucose, Bld: 110 mg/dL — ABNORMAL HIGH (ref 70–99)
Phosphorus: 3.6 mg/dL (ref 2.5–4.6)
Potassium: 3.5 mmol/L (ref 3.5–5.1)
Sodium: 140 mmol/L (ref 135–145)

## 2021-03-13 MED ORDER — POTASSIUM CHLORIDE CRYS ER 20 MEQ PO TBCR
40.0000 meq | EXTENDED_RELEASE_TABLET | ORAL | Status: AC
  Administered 2021-03-13: 40 meq via ORAL
  Filled 2021-03-13: qty 2

## 2021-03-13 MED ORDER — BISACODYL 10 MG RE SUPP
10.0000 mg | Freq: Once | RECTAL | Status: AC
  Administered 2021-03-13: 10 mg via RECTAL
  Filled 2021-03-13: qty 1

## 2021-03-13 NOTE — Progress Notes (Signed)
Progress Note  12 Days Post-Op  Subjective: CC: afebrile and NAEON. Complains of feeling bloated and needing to have BM. Passing flatus. He denies respiratory complaints. Pain well controlled.    Objective: Vital signs in last 24 hours: Temp:  [97.6 F (36.4 C)-99.2 F (37.3 C)] 99 F (37.2 C) (05/16 0819) Pulse Rate:  [80-90] 84 (05/16 0819) Resp:  [20] 20 (05/16 0819) BP: (108-139)/(43-64) 131/58 (05/16 0819) SpO2:  [93 %-96 %] 96 % (05/16 0819) Weight:  [64.8 kg] 64.8 kg (05/16 0459) Last BM Date: 03/11/21 (smear)  Intake/Output from previous day: 05/15 0701 - 05/16 0700 In: -  Out: 600 [Urine:600] Intake/Output this shift: No intake/output data recorded.  PE: General: pleasant, male who is laying in bed in NAD HEENT: head is normocephalic, atraumatic. Mouth is pink and moist Heart: regular, rate, and rhythm. Palpable radial and pedal pulses bilaterally Lungs: CTAB, no wheezes, rhonchi, or rales noted.  Respiratory effort nonlabored on room air Abd: soft, minimally distended and mild expected tenderness near incision. JP with scant bloody output. 2 staples intact in distal incsion MS: all 4 extremities are symmetrical with no cyanosis, clubbing, or edema. Skin: warm and dry with no masses, lesions, or rashes Psych: A&Ox3 with an appropriate affect.        Lab Results:  Recent Labs    03/12/21 0810 03/13/21 0500  WBC 13.1* 10.2  HGB 8.9* 9.0*  HCT 27.2* 27.9*  PLT 254 267   BMET Recent Labs    03/12/21 0810 03/13/21 0749  NA 138 140  K 3.9 3.5  CL 108 110  CO2 24 23  GLUCOSE 95 110*  BUN 42* 45*  CREATININE 1.31* 1.25*  CALCIUM 7.8* 7.8*   PT/INR No results for input(s): LABPROT, INR in the last 72 hours. CMP     Component Value Date/Time   NA 140 03/13/2021 0749   K 3.5 03/13/2021 0749   CL 110 03/13/2021 0749   CO2 23 03/13/2021 0749   GLUCOSE 110 (H) 03/13/2021 0749   BUN 45 (H) 03/13/2021 0749   CREATININE 1.25 (H) 03/13/2021 0749    CALCIUM 7.8 (L) 03/13/2021 0749   PROT 5.5 (L) 03/09/2021 0451   ALBUMIN 2.3 (L) 03/13/2021 0749   AST 31 03/09/2021 0451   ALT 24 03/09/2021 0451   ALKPHOS 85 03/09/2021 0451   BILITOT 0.6 03/09/2021 0451   GFRNONAA 56 (L) 03/13/2021 0749   Lipase     Component Value Date/Time   LIPASE 92 (H) 02/25/2021 0052       Studies/Results: No results found.  Anti-infectives: Anti-infectives (From admission, onward)   Start     Dose/Rate Route Frequency Ordered Stop   03/01/21 1800  piperacillin-tazobactam (ZOSYN) IVPB 3.375 g  Status:  Discontinued        3.375 g 12.5 mL/hr over 240 Minutes Intravenous Every 8 hours 03/01/21 1732 03/11/21 1247   02/26/21 1500  metroNIDAZOLE (FLAGYL) IVPB 500 mg  Status:  Discontinued        500 mg 100 mL/hr over 60 Minutes Intravenous Every 8 hours 02/26/21 1414 03/01/21 1732   02/22/21 2000  cefTRIAXone (ROCEPHIN) 2 g in sodium chloride 0.9 % 100 mL IVPB  Status:  Discontinued        2 g 200 mL/hr over 30 Minutes Intravenous Daily at 10 pm 02/22/21 1339 03/01/21 1716   02/22/21 1600  metroNIDAZOLE (FLAGYL) IVPB 500 mg  Status:  Discontinued        500 mg 100  mL/hr over 60 Minutes Intravenous Every 8 hours 02/22/21 1339 02/26/21 1414       Assessment/Plan  s/p Procedure(s): EXPLORATORY LAPAROTOMY - PRIMARY REPAIR OF DUODENAL PERFORATION WITH GRAHAM PATCH;  INSERTION OF 19 FR. DRAIN (N/A) Advance diet.  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 Radiology recommend nonemergent vascular consultation Elevated troponin- 32 >174 >112, EKG without ischemia changes,cards cleared for surgery E.coli, Enterobacterales bacteremia -completed abx. Repeat Cx's with NGTD Pleural effusions- remains off O2.OnlasixBID. Pulm toilet  Acute biliary pancreatitis, suspect resolving ascending cholangitis, gangrenous cholecystitis  s/p lap chole with Dr. Dwain Sarna on 5/1 - POD#15 -  operative findings of liver cysts and cirrhosis- will need PCP/GI follow up - Path:Acute and chronic cholecystitis with cholelithiasis -No fluid collection identified in the gallbladder fossaon CT 5/10 - LFT wnl 5/12  Duodenal perforation s/p exp lap with primary repair of duodenal perforation with [name] patch - Dr. Freida Busman 5/4 POD #12 - AFVSS,WBC 13 > 12 > 13> 10.2. Trend - UGInegative5/9. Follow up xray with contrast that passed into colon. Having bowel functionbut still having distension and pain. Discussed with MD. Will give trial of CLD  - CT A/P 5/10 w/multiple fluid loculations in the ventral abdomenw/ largest measuring5.1 x 3.7 cm. Reviewed with MD. Fluid collection appear near surgical drain. Did not recommend IR consultation. Will likely need repeat CT scan in next few days to determine if this is developing into a drainable abscess. - BID WTD for midline wound - personally performed today - PT/OT - recommending home with HH -Continue JP and monitor (0 mL last 24h) - remove just prior to discharge  FTD:DUKG, ensure. Encourage PO intake  ID: ceftriaxone 4/27>5/3, flagyl 4/26>5/4, zosyn 5/4>>5/14 Foley: Removed5/6., Voiding. VTE: SCDs,Lovenox  From a surgical perspective if patient continues to clinically improve and tolerating PO he could likely go home with [facility] tomorrow  Interview and physical exam performed with the help of interpretor    LOS: 19 days    Eric Form, [facility] 03/13/2021, 10:32 AM Please see Amion for pager number during day hours 7:00am-4:30pm

## 2021-03-13 NOTE — Progress Notes (Signed)
Physical Therapy Treatment Patient Details Name: Travis Gallagher MRN: [id number] DOB: 1931-07-08 Today's Date: 03/13/2021    History of Present Illness 87 y.o. M admitted 02/22/2021 with gallstone pancreatitis, underwent laparoscopic cholecystectomy with gangrenous cholecystitis. Also found to have LV dysfunction with mild-moderate mitral regurgitation, PSVT, elevated troponin. On 5/4 pt found to have duodenal perforation on CT, underwent ex-lap with repair of duodenal perf with [name] patch. Significant PMH: HTN, GERD, Visiting from [place].    PT Comments    Pt received in supine, agreeable to therapy session, family member [name] present and pt seen in conjunction with OT staff due to patient multidisciplinary therapy needs and decreased activity tolerance. Translation via stratus interpreter as detailed below in cognition. Pt appears more fatigued and less engaged this session, but agreeable to attempt stand pivot transfer to chair and reports significant fatigue and dizziness with exertion, likely orthostatic from sitting to standing (BP 118/48 seated EOB after 3 mins and 92/70's standing despite B knee-high TED hose).  Pt continues to require +1-2 mod/maxA to perform bed mobility and transfers and remains dizzy this date, encouraged him to get up/down frequently and keep legs moving throughout the day to improve hemodynamics. Continue to recommend HHPT and initial 24/7 supervision for mobility.   Follow Up Recommendations  Home health PT;Supervision for mobility/OOB (may need initial +2 physical assist)     Equipment Recommendations  Rolling walker with 5" wheels;3in1 (PT);Other (comment)    Recommendations for Other Services       Precautions / Restrictions Precautions Precautions: Fall Precaution Comments: JP drain, briefs helpful (diarrhea), knee-high TED hose Restrictions Weight Bearing Restrictions: No    Mobility  Bed Mobility Overal bed mobility: Needs  Assistance Bed Mobility: Rolling;Sidelying to Sit Rolling: Mod assist Sidelying to sit: Mod assist            Transfers Overall transfer level: Needs assistance Equipment used: Rolling walker (2 wheeled) Transfers: Sit to/from Stand Sit to Stand: Mod assist;+2 safety/equipment Stand pivot transfers: Mod assist;+2 physical assistance;+2 safety/equipment       General transfer comment: assist to rise and steady, cues for hand placement with poor carryover; needs hand over hand assist to reach back for chair armrest prior to sitting;  mostly modA and increased assist with fatigue and needs +2 assist for safety throughout due to lines/weakness and symptoms dizziness  Ambulation/Gait Ambulation/Gait assistance: Mod assist;+2 safety/equipment Gait Distance (Feet): 5 Feet Assistive device: Rolling walker (2 wheeled) Gait Pattern/deviations: Step-through pattern;Drifts right/left;Decreased stride length     General Gait Details: mildly unsteady, weak-kneed, low amplitude steps, needs modA for RW mgmt and lateral unsteadiness.  Sats maintained in mid 90's or better on RA, respiratory rate elevated to high-30's rpm post-exertion resting in chair;HR 80's-90's bpm.   Stairs             Wheelchair Mobility    Modified Rankin (Stroke Patients Only)       Balance Overall balance assessment: Needs assistance Sitting-balance support: Feet supported;Bilateral upper extremity supported Sitting balance-Leahy Scale: Fair Sitting balance - Comments: supervision   Standing balance support: Bilateral upper extremity supported Standing balance-Leahy Scale: Poor Standing balance comment: reliant on RW, needs min/modA for standing balance esp during self-care tasks that needs 1-2 hands such as pulling up briefs                            Cognition Arousal/Alertness: Awake/alert Behavior During Therapy: [facility] for tasks assessed/performed;Flat affect Overall  Cognitive Status:  Impaired/Different from baseline Area of Impairment: Following commands;Safety/judgement;Problem solving;Memory;Attention                   Current Attention Level: Sustained Memory: Decreased short-term memory;Decreased recall of precautions Following Commands: Follows one step commands with increased time;Follows multi-step commands inconsistently Safety/Judgement: Decreased awareness of safety   Problem Solving: Slow processing;Difficulty sequencing;Requires verbal cues;Requires tactile cues;Decreased initiation General Comments: pt more lethargic and less animated this date, seems internally distracted due to abdominal discomfort/pain; pt was more "himself" on Friday but more drowsy/with decreased initiation this date.      Exercises Other Exercises Other Exercises: encouraged ankle pumps and use of IS throughout day.    General Comments General comments (skin integrity, edema, etc.): BP 119/57 (74) supine; BP 128/54 (76) seated; BP 118/48 (67) seated after 3 mins; BP 92/70's standing (dizzy)      Pertinent Vitals/Pain Pain Assessment: Faces Faces Pain Scale: Hurts even more Pain Location: abdomen Pain Descriptors / Indicators: Sore;Discomfort;Pressure;Grimacing Pain Intervention(s): Limited activity within patient's tolerance;Monitored during session;Repositioned;RN gave pain meds during session    Home Living                      Prior Function            PT Goals (current goals can now be found in the care plan section) Acute Rehab PT Goals Patient Stated Goal: get better PT Goal Formulation: With patient Time For Goal Achievement: 03/16/21 Potential to Achieve Goals: Good Progress towards PT goals: Progressing toward goals    Frequency    Min 3X/week      PT Plan Current plan remains appropriate    Co-evaluation PT/OT/SLP Co-Evaluation/Treatment: Yes Reason for Co-Treatment: Complexity of the patient's impairments (multi-system  involvement);Necessary to address cognition/behavior during functional activity;For patient/therapist safety;To address functional/ADL transfers PT goals addressed during session: Mobility/safety with mobility;Balance;Proper use of DME;Strengthening/ROM        AM-PAC PT "6 Clicks" Mobility   Outcome Measure  Help needed turning from your back to your side while in a flat bed without using bedrails?: A Little Help needed moving from lying on your back to sitting on the side of a flat bed without using bedrails?: A Lot Help needed moving to and from a bed to a chair (including a wheelchair)?: A Lot Help needed standing up from a chair using your arms (e.g., wheelchair or bedside chair)?: A Lot Help needed to walk in hospital room?: A Lot Help needed climbing 3-5 steps with a railing? : A Lot 6 Click Score: 13    End of Session Equipment Utilized During Treatment: Gait belt Activity Tolerance: Patient limited by fatigue;Patient limited by pain Patient left: in chair;with call bell/phone within reach;with family/visitor present;with chair alarm set (daughter in law [place] present in room) Nurse Communication: Mobility status;Other (comment) (some chalk/TPN colored drainage near flank present near drain on R hip, RN notified) PT Visit Diagnosis: Other abnormalities of gait and mobility (R26.89) Pain - part of body:  (abdomen)     Time: 7867-6720 PT Time Calculation (min) (ACUTE ONLY): 32 min  Charges:  $Therapeutic Activity: 8-22 mins                     Jonet Mathies P., PTA Acute Rehabilitation Services Pager: [phone] Office: [phone]   Dorathy Kinsman Melville Engen 03/13/2021, 2:09 PM

## 2021-03-13 NOTE — Progress Notes (Signed)
Occupational Therapy Treatment Patient Details Name: Travis Gallagher MRN: [id number] DOB: 1931-03-12 Today's Date: 03/13/2021    History of present illness 87 y.o. M admitted 02/22/2021 with gallstone pancreatitis, underwent laparoscopic cholecystectomy with gangrenous cholecystitis. Also found to have LV dysfunction with mild-moderate mitral regurgitation, PSVT, elevated troponin. On 5/4 pt found to have duodenal perforation on CT, underwent ex-lap with repair of duodenal perf with [name] patch. Significant PMH: HTN, GERD, Visiting from [place].   OT comments  Pt reporting moderate abdominal pain (RN provided IV meds), fatigue and dizziness during session with decreased diastolic BP upon standing and increased systolic BP. Mod assist for LB dressing (disposable briefs) and to don front opening gown. Completed grooming in chair with set up. Family participating in session, but also used video interpreter. Pt less responsive to interpreter than prior session.   Follow Up Recommendations  Home health OT    Equipment Recommendations  3 in 1 bedside commode    Recommendations for Other Services      Precautions / Restrictions Precautions Precautions: Fall Precaution Comments: JP drain, briefs helpful (diarrhea), knee-high TED hose Restrictions Weight Bearing Restrictions: No       Mobility Bed Mobility Overal bed mobility: Needs Assistance Bed Mobility: Rolling;Sidelying to Sit Rolling: Mod assist Sidelying to sit: Mod assist       General bed mobility comments: step by step, multimodal cues for technique, mod assist for all aspects    Transfers Overall transfer level: Needs assistance Equipment used: Rolling walker (2 wheeled) Transfers: Sit to/from Stand Sit to Stand: Mod assist;+2 safety/equipment Stand pivot transfers: Mod assist;+2 physical assistance;+2 safety/equipment       General transfer comment: multimodal cues for hand placement, assist to rise and  steady, stood x 3, reports dizziness, VS monitored    Balance Overall balance assessment: Needs assistance Sitting-balance support: Feet supported;Bilateral upper extremity supported Sitting balance-Leahy Scale: Fair Sitting balance - Comments: supervision   Standing balance support: Bilateral upper extremity supported Standing balance-Leahy Scale: Poor Standing balance comment: reliant on RW, needs min/modA for standing balance esp during self-care tasks that needs 1-2 hands such as pulling up briefs                           ADL either performed or assessed with clinical judgement   ADL Overall ADL's : Needs assistance/impaired     Grooming: Wash/dry hands;Wash/dry face;Oral care;Sitting;Set up           Upper Body Dressing : Moderate assistance;Sitting Upper Body Dressing Details (indicate cue type and reason): front opening gown Lower Body Dressing: Moderate assistance;Sit to/from stand Lower Body Dressing Details (indicate cue type and reason): assist for depends over feet, min assist to pull up in standing                     Vision       Perception     Praxis      Cognition Arousal/Alertness: Awake/alert Behavior During Therapy: Flat affect Overall Cognitive Status: Impaired/Different from baseline Area of Impairment: Following commands;Safety/judgement;Problem solving;Memory;Attention                   Current Attention Level: Sustained Memory: Decreased short-term memory;Decreased recall of precautions Following Commands: Follows one step commands with increased time;Follows multi-step commands inconsistently Safety/Judgement: Decreased awareness of safety   Problem Solving: Slow processing;Difficulty sequencing;Requires verbal cues;Requires tactile cues;Decreased initiation General Comments: pt requiring encouragement, reports pain and fatigue,  slower to respond to directions and questions        Exercises Exercises: Other  exercises Other Exercises Other Exercises: encouraged ankle pumps and use of IS throughout day.   Shoulder Instructions       General Comments BP 119/57 (74) supine; BP 128/54 (76) seated; BP 118/48 (67) seated after 3 mins; BP 92/70's standing (dizzy)    Pertinent Vitals/ Pain       Pain Assessment: 0-10 Pain Score: 5  Faces Pain Scale: Hurts even more Pain Location: abdomen Pain Descriptors / Indicators: Sore;Discomfort;Pressure;Grimacing Pain Intervention(s): Monitored during session;Repositioned;RN gave pain meds during session  Home Living                                          Prior Functioning/Environment              Frequency  Min 2X/week        Progress Toward Goals  OT Goals(current goals can now be found in the care plan section)  Progress towards OT goals: Progressing toward goals  Acute Rehab OT Goals Patient Stated Goal: get better OT Goal Formulation: With patient/family Time For Goal Achievement: 03/22/21 Potential to Achieve Goals: Good  Plan Discharge plan remains appropriate    Co-evaluation    PT/OT/SLP Co-Evaluation/Treatment: Yes Reason for Co-Treatment: Complexity of the patient's impairments (multi-system involvement);For patient/therapist safety PT goals addressed during session: Mobility/safety with mobility;Balance;Proper use of DME;Strengthening/ROM OT goals addressed during session: ADL's and self-care      AM-PAC OT "6 Clicks" Daily Activity     Outcome Measure   Help from another person eating meals?: None Help from another person taking care of personal grooming?: A Little Help from another person toileting, which includes using toliet, bedpan, or urinal?: A Lot Help from another person bathing (including washing, rinsing, drying)?: A Lot Help from another person to put on and taking off regular upper body clothing?: A Lot Help from another person to put on and taking off regular lower body clothing?:  A Lot 6 Click Score: 15    End of Session Equipment Utilized During Treatment: Gait belt;Rolling walker  OT Visit Diagnosis: Unsteadiness on feet (R26.81);Other abnormalities of gait and mobility (R26.89);Muscle weakness (generalized) (M62.81);Pain   Activity Tolerance Patient limited by fatigue;Patient limited by pain (dizziness)   Patient Left in chair;with call bell/phone within reach;with chair alarm set;with family/visitor present   Nurse Communication Patient requests pain meds        Time: 8099-8338 OT Time Calculation (min): 37 min  Charges: OT General Charges $OT Visit: 1 Visit OT Treatments $Self Care/Home Management : 8-22 mins  [name], OTR/L Acute Rehabilitation Services Pager: [phone] Office: [phone]   [name] 03/13/2021, 2:40 PM

## 2021-03-13 NOTE — Progress Notes (Signed)
Mobility Specialist: Progress Note   03/13/21 1444  Mobility  Activity Transferred:  Chair to bed  Level of Assistance Moderate assist, patient does 50-74%  Assistive Device None (Hand Held)  Distance Ambulated (ft) 2 ft  Mobility Ambulated with assistance in room  Mobility Response Tolerated well  Mobility performed by Mobility specialist  $Mobility charge 1 Mobility   Post-Mobility: 83 HR  Assisted pt back to bed per request. Pt was modA to stand from chair and during transfer to the bed. Bed alarm is on with call bell at his side.   [facility] Brittani Purdum Mobility Specialist Mobility Specialist Phone: [phone]

## 2021-03-13 NOTE — Progress Notes (Signed)
PROGRESS NOTE  Travis Gallagher CVE:[id number] DOB: [birth year] DOA: 02/22/2021 PCP: Pcp, No   LOS: 19 days   Brief narrative:  Travis Gallagher is a 87 y.o. male with medical history significant for hypertension, GERD was admitted to hospital as transfer from [facility] for management of acute pancreatitis in the setting of potential choledocholithiasis.  Patient was also found to have acute biliary pancreatitis.  GI was initially consulted but MRCP did not show choledocholithiasis.  Patient underwent laparoscopic cholecystectomy on 02/26/2021.  Cardiology was consulted for preop risk stratification.    During hospitalization, post -operatively patient continued to have abdominal pain and distention with renal failure.  He was noted to have duodenal perforation and biliary leak so underwent relaparotomy on 03/01/2021.  After relaparotomy patient had briefly had an NG tube placed in.  Upper GI study was performed on 03/06/2021.  Patient did have a spikes of fever so CT scan of the abdomen chest has been ordered.  Patient has remained on TPN.   Assessment/Plan:    Principal Problem:   Acute pancreatitis Active Problems:   Severe sepsis (HCC)   Cholangitis   Abdominal pain   Nausea   Hypertension   GERD (gastroesophageal reflux disease)   Elevated troponin   Protein-calorie malnutrition, severe  Acute biliary pancreatitis:    Patient underwent laparoscopic cholecystectomy by general surgery on 02/26/2021.  Postoperatively, patient continued to have increasing pain and distention and renal failure.  CT scan of the abdomen was performed which showed signs of biliary leak.  Patient was then taken back to the OR on 03/01/2021 and was noted to have a duodenal perforation.  He underwent laparotomy with primary repair of duodenal perforation with Phillip Heal patch by Dr. Zenia Resides on 03/01/21.  Patient underwent upper GI study on 03/06/2021 without note of extravasation.  Contrast flow was demonstrated to  the colon. Tolerating soft diet.   Fever.  ?  If related to atelectasis.  CT of the chest abdomen pelvis did not show any evidence of PE.  Initial blood culture on admission was positive for E. coli, but repeat cultures have not shown any growth. There was mention of fluid collection on CT of abdomen near surgical site, may need repeat CT in next few days to monitor for developing abscess. Leukocytosis now resolved and he has been afebrile  Duodenal perforation, biliary leak.  Status post exploratory laparotomy with repair of duodenal perforation on 03/01/2021. Currently patient is on soft diet. TPN has been discontinued.  Leukocytosis has resolved.  We will continue to monitor.  CBC Latest Ref Rng & Units 03/13/2021 03/12/2021 03/11/2021  WBC 4.0 - 10.5 K/uL 10.2 13.1(H) 12.1(H)  Hemoglobin 13.0 - 17.0 g/dL 9.0(L) 8.9(L) 8.6(L)  Hematocrit 39.0 - 52.0 % 27.9(L) 27.2(L) 26.7(L)  Platelets 150 - 400 K/uL 267 254 234    Liver cyst and cirrhosis during surgical intervention.  Will need outpatient PCP and GI follow-up.    Severe sepsis/enterobacter and E. coli bacteremia secondary to ascending cholangitis, gangrenous cholecystitis Currently on IV Zosyn, secondary to duodenal perforation.  Patient initially met sepsis criteria on admission. CT of the chest, abdomen and pelvis was negative for pulmonary embolism, also showed multiple fluid collections in the ventral abdomen related to possible seromas versus abscess.  At this point, per surgery he does not need further antibiotics from surgery standpoint. His bacteremia had been adequately treated, so zosyn was discontinued.   Elevated troponin with LV systolic dysfunction, acute on chronic combined congestive heart failure. EKG  did not show any ischemic changes.  Possibly secondary to demand ischemia . 2D echocardiogram with left ventricular ejection fraction of 40 to 45% with diffuse hypokinesis and abnormal septal motion wall.  Cardiology followed the  patient during hospitalization.  Aspirin on hold due to recent postop status.  Will need outpatient follow-up with cardiology. Continue on coreg. He had been receiving IV lasix with good urine output. May need to discharge with oral diuretics   Mild Dilation of acending aorta Follow-up with vascular surgery as outpatient.    PSVT- 2D echo with EF of 45 to 50%.  Continue on coreg.  Will need repeat TSH follow-up as outpatient.   Acute kidney injury Improved.  Latest creatinine of 1.2   Essential hypertension:  Not on antihypertensives at home.  Currently on coreg.  IV hydralazine has been added for better control.  Continue to monitor.     GERD: Continue PPI   Debility, weakness.  Physical therapy has seen the patient and recommend home health PT on discharge.  Encouraged ambulation.    Hypophosphatemia.   Resolved after replacement  Anasarca/bilateral pleural effusions -Patient noted to have significant hypoalbuminemia -overall improved with IV lasix -since bun/cr are trending up, will hold further lasix    DVT prophylaxis: Place TED hose Start: 03/03/21 1742 enoxaparin (LOVENOX) injection 40 mg Start: 03/03/21 1000 Place and maintain sequential compression device Start: 02/23/21 1520 SCDs Start: 02/22/21 1225   Code Status: Full code  Family Communication:  Discussed with patient via interpreter  Status is: Inpatient  Remains inpatient appropriate because: IV treatments appropriate due to intensity of illness or inability to take PO and Inpatient level of care appropriate due to severity of illness, status post exploratory laparotomy, parenteral nutrition, IV antibiotics.  Dispo: The patient is from: Home              Anticipated d/c is to: Home with home health              Patient currently is not medically stable to d/c.   Difficult to place patient No  Consultants:  GI  Cardiology  General surgery  Procedures:  Laparoscopic cholecystectomy  02/26/2021  Exploratory laparotomy with primary repair of duodenal perforation with Phillip Heal patch on 03/01/2021  NG tube placement and removal.  Right upper extremity PICC placement on 03/02/2021  Anti-infectives:  Marland Kitchen Ceftriaxone and metronidazole IV 4/27>5/4 . Zosyn 03/01/2021>5/14  Anti-infectives (From admission, onward)   Start     Dose/Rate Route Frequency Ordered Stop   03/01/21 1800  piperacillin-tazobactam (ZOSYN) IVPB 3.375 g  Status:  Discontinued        3.375 g 12.5 mL/hr over 240 Minutes Intravenous Every 8 hours 03/01/21 1732 03/11/21 1247   02/26/21 1500  metroNIDAZOLE (FLAGYL) IVPB 500 mg  Status:  Discontinued        500 mg 100 mL/hr over 60 Minutes Intravenous Every 8 hours 02/26/21 1414 03/01/21 1732   02/22/21 2000  cefTRIAXone (ROCEPHIN) 2 g in sodium chloride 0.9 % 100 mL IVPB  Status:  Discontinued        2 g 200 mL/hr over 30 Minutes Intravenous Daily at 10 pm 02/22/21 1339 03/01/21 1716   02/22/21 1600  metroNIDAZOLE (FLAGYL) IVPB 500 mg  Status:  Discontinued        500 mg 100 mL/hr over 60 Minutes Intravenous Every 8 hours 02/22/21 1339 02/26/21 1414     Subjective: Feels as though he has to pass stool, but has been having difficulty doing  so today. No nausea or vomiting.   Objective: Vitals:   03/13/21 0459 03/13/21 0819  BP: 133/64 (!) 131/58  Pulse: 80 84  Resp: 20 20  Temp: 99.2 F (37.3 C) 99 F (37.2 C)  SpO2: 95% 96%    Intake/Output Summary (Last 24 hours) at 03/13/2021 1138 Last data filed at 03/13/2021 0500 Gross per 24 hour  Intake --  Output 600 ml  Net -600 ml   Filed Weights   03/11/21 0500 03/12/21 0500 03/13/21 0459  Weight: 62.3 kg 61.7 kg 64.8 kg   Body mass index is 22.37 kg/m.   Physical Exam: General exam: Alert, awake, oriented x 3 Respiratory system: Clear to auscultation. Respiratory effort normal. Cardiovascular system:RRR. No murmurs, rubs, gallops. Gastrointestinal system: Abdomen is nondistended, soft and tender  over incision sites. Drain in place. No organomegaly or masses felt. Normal bowel sounds heard. Central nervous system: Alert and oriented. No focal neurological deficits. Extremities: No C/C/E, +pedal pulses Skin: No rashes, lesions or ulcers Psychiatry: Judgement and insight appear normal. Mood & affect appropriate.     Data Review: I have personally reviewed the following laboratory data and studies,  CBC: Recent Labs  Lab 03/09/21 1054 03/10/21 0500 03/11/21 0500 03/12/21 0810 03/13/21 0500  WBC 14.8* 13.5* 12.1* 13.1* 10.2  HGB 9.9* 8.7* 8.6* 8.9* 9.0*  HCT 31.4* 26.9* 26.7* 27.2* 27.9*  MCV 92.1 89.7 89.6 89.8 90.9  PLT 226 219 234 254 485   Basic Metabolic Panel: Recent Labs  Lab 03/07/21 1213 03/08/21 0339 03/09/21 0451 03/10/21 0500 03/11/21 0500 03/12/21 0810 03/13/21 0749  NA 137   < > 138 137 139 138 140  K 3.9   < > 3.8 3.8 4.3 3.9 3.5  CL 105   < > 109 105 108 108 110  CO2 28   < > '25 25 25 24 23  ' GLUCOSE 136*   < > 126* 118* 117* 95 110*  BUN 26*   < > 29* 29* 33* 42* 45*  CREATININE 0.98   < > 1.07 1.12 1.23 1.31* 1.25*  CALCIUM 7.5*   < > 7.8* 8.3* 7.9* 7.8* 7.8*  MG 2.1  --  2.2  --   --   --   --   PHOS 3.0  --  3.3 3.3 3.7 4.2 3.6   < > = values in this interval not displayed.   Liver Function Tests: Recent Labs  Lab 03/07/21 1213 03/08/21 0339 03/09/21 0451 03/10/21 0500 03/11/21 0500 03/12/21 0810 03/13/21 0749  AST '29 26 31  ' --   --   --   --   ALT '18 19 24  ' --   --   --   --   ALKPHOS 100 90 85  --   --   --   --   BILITOT 0.2* 0.7 0.6  --   --   --   --   PROT 5.1* 5.0* 5.5*  --   --   --   --   ALBUMIN 1.7* 1.6* 2.1* 3.0* 2.5* 2.4* 2.3*   No results for input(s): LIPASE, AMYLASE in the last 168 hours. No results for input(s): AMMONIA in the last 168 hours. Cardiac Enzymes: No results for input(s): CKTOTAL, CKMB, CKMBINDEX, TROPONINI in the last 168 hours. BNP (last 3 results) No results for input(s): BNP in the last 8760  hours.  ProBNP (last 3 results) No results for input(s): PROBNP in the last 8760 hours.  CBG: Recent Labs  Lab 03/10/21 1614 03/10/21 2128 03/11/21 0603 03/11/21 1115 03/11/21 1759  GLUCAP 137* 143* 146* 145* 128*   Recent Results (from the past 240 hour(s))  Culture, blood (Routine X 2) w Reflex to ID Panel     Status: None   Collection Time: 03/05/21  5:30 PM   Specimen: BLOOD  Result Value Ref Range Status   Specimen Description BLOOD LEFT ANTECUBITAL  Final   Special Requests   Final    BOTTLES DRAWN AEROBIC ONLY Blood Culture adequate volume   Culture   Final    NO GROWTH 5 DAYS Performed at [facility], [place] [address] [place], [place] [address]    Report Status 03/10/2021 FINAL  Final  Culture, blood (Routine X 2) w Reflex to ID Panel     Status: None   Collection Time: 03/05/21  5:36 PM   Specimen: BLOOD LEFT WRIST  Result Value Ref Range Status   Specimen Description BLOOD LEFT WRIST  Final   Special Requests   Final    BOTTLES DRAWN AEROBIC ONLY Blood Culture adequate volume   Culture   Final    NO GROWTH 5 DAYS Performed at [facility], [place] [address] [place],  [address]    Report Status 03/10/2021 FINAL  Final     Studies: No results found.   Kathie Dike, MD  [facility] 03/13/2021  If 7PM-7AM, please contact night-coverage

## 2021-03-14 ENCOUNTER — Other Ambulatory Visit (HOSPITAL_COMMUNITY): Payer: Self-pay

## 2021-03-14 ENCOUNTER — Other Ambulatory Visit: Payer: Self-pay | Admitting: Internal Medicine

## 2021-03-14 ENCOUNTER — Encounter: Payer: Self-pay | Admitting: Gastroenterology

## 2021-03-14 DIAGNOSIS — I714 Abdominal aortic aneurysm, without rupture, unspecified: Secondary | ICD-10-CM

## 2021-03-14 MED ORDER — ENSURE ENLIVE PO LIQD
237.0000 mL | Freq: Three times a day (TID) | ORAL | 12 refills | Status: AC
  Filled 2021-03-14: qty 237, 1d supply, fill #0

## 2021-03-14 MED ORDER — TRAMADOL HCL 50 MG PO TABS
50.0000 mg | ORAL_TABLET | Freq: Four times a day (QID) | ORAL | 0 refills | Status: DC | PRN
  Filled 2021-03-14: qty 20, 5d supply, fill #0

## 2021-03-14 MED ORDER — PANTOPRAZOLE SODIUM 40 MG PO TBEC
40.0000 mg | DELAYED_RELEASE_TABLET | Freq: Two times a day (BID) | ORAL | 1 refills | Status: DC
  Filled 2021-03-14: qty 60, 30d supply, fill #0

## 2021-03-14 MED ORDER — CARVEDILOL 3.125 MG PO TABS
3.1250 mg | ORAL_TABLET | Freq: Two times a day (BID) | ORAL | 1 refills | Status: DC
  Filled 2021-03-14: qty 60, 30d supply, fill #0

## 2021-03-14 NOTE — Progress Notes (Signed)
Physical Therapy Treatment Patient Details Name: Travis Gallagher MRN: [id number] DOB: 10-Apr-1931 Today's Date: 03/14/2021    History of Present Illness 87 y.o. M admitted 02/22/2021 with gallstone pancreatitis, underwent laparoscopic cholecystectomy with gangrenous cholecystitis. Also found to have LV dysfunction with mild-moderate mitral regurgitation, PSVT, elevated troponin. On 5/4 pt found to have duodenal perforation on CT, underwent ex-lap with repair of duodenal perf with [name] patch. Significant PMH: HTN, GERD, Visiting from [place].    PT Comments    Pt received in supine, agreeable to therapy session and with good participation and tolerance for gait and stair training. Pt performed all tasks with decreased assist (mostly minA) but second person present for safety/chair follow throughout as well as family for education and translator Ashby Dawes present as well. Emphasis on HEP, safety with transfers, stair ascent/descent safety with guarding/assist position for family, safe progression of mobility within tolerance. Pt family asking questions regarding HH RN, bedside RN notified at end of session. Pt continues to benefit from PT services to progress toward functional mobility goals. Continue to recommend HHPT.  Follow Up Recommendations  Home health PT;Supervision for mobility/OOB     Equipment Recommendations  Rolling walker with 5" wheels;3in1 (PT)    Recommendations for Other Services       Precautions / Restrictions Precautions Precautions: Fall Precaution Comments: Briefs helpful (diarrhea), knee-high TED hose Restrictions Weight Bearing Restrictions: No    Mobility  Bed Mobility Overal bed mobility: Needs Assistance Bed Mobility: Rolling;Sidelying to Sit Rolling: Min assist;+2 for safety/equipment Sidelying to sit: Min assist;+2 for safety/equipment       General bed mobility comments: step by step, multimodal cues for technique, minA to raise trunk     Transfers Overall transfer level: Needs assistance Equipment used: Rolling walker (2 wheeled) Transfers: Sit to/from Stand Sit to Stand: +2 safety/equipment;Min assist         General transfer comment: Multimodal cues for hand placement, assist to rise and steady, stood x 2, no dizziness  Ambulation/Gait Ambulation/Gait assistance: Min assist;+2 safety/equipment (chair follow but +1 physical assist only) Gait Distance (Feet): 50 Feet Assistive device: Rolling walker (2 wheeled) Gait Pattern/deviations: Step-through pattern;Drifts right/left;Decreased stride length     General Gait Details: chair follow for safety but no LOB, minA at times for RW management/steadying, slow pace; VSS on RA   Stairs Stairs: Yes Stairs assistance: Min assist;+2 safety/equipment Stair Management: Step to pattern;Forwards;One rail Left Number of Stairs: 3 General stair comments: pt family present for instruction on step sequencing and caregiver assist, slow pace, no LOB or buckling, minA for safety and RUE HHA (L rail per home setup)   Wheelchair Mobility    Modified Rankin (Stroke Patients Only)       Balance Overall balance assessment: Needs assistance Sitting-balance support: Feet supported;Bilateral upper extremity supported Sitting balance-Leahy Scale: Fair     Standing balance support: Bilateral upper extremity supported Standing balance-Leahy Scale: Poor Standing balance comment: reliant on RW, needs minA for standing balance esp during self-care tasks that needs 1-2 hands such as pulling up briefs                            Cognition Arousal/Alertness: Awake/alert Behavior During Therapy: WFL for tasks assessed/performed Overall Cognitive Status: Impaired/Different from baseline Area of Impairment: Safety/judgement;Problem solving;Memory;Following commands                   Current Attention Level: Sustained Memory: Decreased short-term memory;Decreased  recall of precautions Following Commands: Follows one step commands with increased time;Follows multi-step commands inconsistently Safety/Judgement: Decreased awareness of safety   Problem Solving: Slow processing;Difficulty sequencing;Requires verbal cues;Requires tactile cues General Comments: pt needs multimodal cues, participatory as able      Exercises Other Exercises Other Exercises: encouraged ankle pumps and use of IS throughout day. Other Exercises: supine BLE AAROM: quad sets, glute sets, hip abduction, heel slides, LAQ x10 reps ea per HEP handout in spanish    General Comments General comments (skin integrity, edema, etc.): VSS on RA; minimal drainage from dressing on R abdomen where drain recently pulled; RN aware, pt family has further questions regarding abdominal incision and RN notified      Pertinent Vitals/Pain Pain Assessment: Faces Faces Pain Scale: Hurts little more Pain Location: abdomen Pain Descriptors / Indicators: Sore;Discomfort;Pressure Pain Intervention(s): Monitored during session;Premedicated before session;Repositioned    Home Living                      Prior Function            PT Goals (current goals can now be found in the care plan section) Acute Rehab PT Goals Patient Stated Goal: get better PT Goal Formulation: With patient Time For Goal Achievement: 03/16/21 Potential to Achieve Goals: Good Progress towards PT goals: Progressing toward goals    Frequency    Min 3X/week      PT Plan Current plan remains appropriate    Co-evaluation              AM-PAC PT "6 Clicks" Mobility   Outcome Measure  Help needed turning from your back to your side while in a flat bed without using bedrails?: A Little Help needed moving from lying on your back to sitting on the side of a flat bed without using bedrails?: A Little Help needed moving to and from a bed to a chair (including a wheelchair)?: A Little Help needed standing  up from a chair using your arms (e.g., wheelchair or bedside chair)?: A Little Help needed to walk in hospital room?: A Little Help needed climbing 3-5 steps with a railing? : A Little 6 Click Score: 18    End of Session Equipment Utilized During Treatment: Gait belt Activity Tolerance: Patient tolerated treatment well Patient left: in chair;with call bell/phone within reach;with family/visitor present;Other (comment) (son [place]over and [place] DIL present, translator speaking with RN about their questions) Nurse Communication: Mobility status;Other (comment) (family has further questions regarding HH RN) PT Visit Diagnosis: Other abnormalities of gait and mobility (R26.89) Pain - part of body:  (abdomen, minimal)     Time: 5732-2025 PT Time Calculation (min) (ACUTE ONLY): 24 min  Charges:  $Gait Training: 8-22 mins $Therapeutic Exercise: 8-22 mins                     Lisa Milian P., PTA Acute Rehabilitation Services Pager: [phone] Office: [phone]   Angus [place]ms 03/14/2021, 6:12 PM

## 2021-03-14 NOTE — TOC Transition Note (Signed)
Transition of Care (TOC) - CM/SW Discharge Note Donn Pierini RN, BSN Transitions of Care Unit 4E- RN Case Manager See Treatment Team for direct phone #    Patient Details  Name: Travis Gallagher MRN: [id number] Date of Birth: 03-31-1931  Transition of Care [facility]) CM/SW Contact:  Darrold Span, RN Phone Number: 03/14/2021, 3:11 PM   Clinical Narrative:    Pt has been cleared for transition home with daughter/family. Orders for HHRN/PT/OT have been placed. Per PT/OT recommendations- w/c no longer recommended- only RW and 3n1.   Call made to [employer] for DME needs- RW and 3n1- DME to be delivered to room prior to discharge.   Call made to [facility] with [facility] to f/u on [facility] referral made on May 3 per Gertie Gowda. RNCM. Per Macario Golds was never able to confirm if they were able to accept referral- so referral was not confirmed and they are no longer willing to see if pt is eligible for charity services as it not their week to cover. Call then made to [facility] as they are the [facility] agency covering this week- spoke with Amy for [facility] referral RN/PT/OT- they will review and see if they can service and if pt is eligible under charity program for [facility] services.   TOC pharmacy to fill meds for discharge- pt has been placed into [facility] program for pt assistance.   CM spoke with daughter at the bedside via interpreter Graciela. Discussed HH referral and process, also asked if daughter still was interested in outpt therapy if pt does not qualify for [facility] services- daughter voiced yes- will refer to [facility] outpt PT/OT if pt does not qualify for [facility].  Also provided daughter with new pt packet for [facility]- explained she will need to f/u with filling out forms and going to clinic to submit before they will give a f/u appointment with provider- daughter voiced understanding.  DME delivered while transition needs being discussed.  Also explained that pharmacy here would be providing meds,  asked daughter is she was ok with paying copay of around $43- daughter voiced yes she could pay the cost for meds.  Daughter provided chance to ask questions- questions answered via interpreter.      Final next level of care: Home w Home Health Services Barriers to Discharge: No Barriers Identified   Patient Goals and CMS Choice Patient states their goals for this hospitalization and ongoing recovery are:: to return to daughters home CMS Medicare.gov Compare Post Acute Care list provided to:: Patient Choice offered to / list presented to : Adult Children  Discharge Placement               home with [facility] (pending review from [facility])        Discharge Plan and Services   Discharge Planning Services: CM Consult Post Acute Care Choice: Durable Medical Equipment          DME Arranged: Dan Humphreys rolling,3-N-1 DME Agency: [employer] Date DME Agency Contacted: 03/14/21 Time DME Agency Contacted: 1415 Representative spoke with at DME Agency: Francesco Sor Arranged: RN,PT,OT HH Agency: [facility] Date [facility] Agency Contacted: 03/14/21 Time HH Agency Contacted: 1430 Representative spoke with at [facility] Agency: Amy  Social Determinants of Health (SDOH) Interventions     Readmission Risk Interventions Readmission Risk Prevention Plan 03/14/2021  Transportation Screening Complete  PCP or Specialist Appt within 5-7 Days Complete  Home Care Screening Complete  Medication Review (RN CM) Complete

## 2021-03-14 NOTE — Progress Notes (Signed)
Nutrition Follow Up  DOCUMENTATION CODES:   Severe malnutrition in context of acute illness/injury  INTERVENTION:    Ensure Enlive po TID, each supplement provides 350 kcal and 20 grams of protein  Magic cup TID with meals, each supplement provides 290 kcal and 9 grams of protein  NUTRITION DIAGNOSIS:   Severe Malnutrition related to acute illness (acute pancreatitis) as evidenced by severe fat depletion,severe muscle depletion,energy intake < or equal to 50% for > or equal to 5 days.  Ongoing  GOAL:   Patient will meet greater than or equal to 90% of their needs   Progressing   MONITOR:   PO intake,Supplement acceptance,Weight trends,Labs,I & O's,Diet advancement  REASON FOR ASSESSMENT:   NPO/Clear Liquid Diet   ASSESSMENT:   87 yo male with a PMH of HTN and GERD who presents with acute pancreatitis.   5/02 - lap cholecystectomy for gangrenous cholecystitis 5/04 - brought to the operating room emergently for exploration and duodenal perforation repair 5/05 - NGT placed for onset of abdominal pain, TPN started 5/09- diet advanced to clear liquid  5/10 - evacuated hematoma at top of midline wound 5/12 - diet advanced to clear liquid 5/13- diet advanced to full liquid  5/14- TPN stopped per surgery, diet advanced to soft   Patient eating more each day per reports. RD observed lunch tray 50% completed with majority of the protein consumed. Patient taking Ensure intermittently. Plan to discharge home today.   RD educated daughter and patient on soft diet and reiterated the importance of continued protein intake.   Admission weight: 64 kg  Current weight: 63.1 kg   Medications: reviewed  Labs: CBG 128-146  Diet Order:   Diet Order            Diet - low sodium heart healthy           DIET SOFT Room service appropriate? Yes; Fluid consistency: Thin  Diet effective now                EDUCATION NEEDS:   Education needs have been addressed  Skin:  Skin  Assessment: Skin Integrity Issues: Skin Integrity Issues:: Incisions Incisions: Abdomen x 5, closed  Last BM:  5/14- smear  Height:  Ht Readings from Last 1 Encounters:  02/21/21 5\' 7"  (1.702 m)   Weight:  Wt Readings from Last 1 Encounters:  03/14/21 63.1 kg   Ideal Body Weight:  67.3 kg  BMI:  Body mass index is 21.79 kg/m.  Estimated Nutritional Needs:   Kcal:  1900-2100   Protein:  90-105 grams   Fluid:  >1.9 L  03/16/21 RD, LDN Clinical Nutrition Pager listed in AMION

## 2021-03-14 NOTE — Progress Notes (Signed)
Mobility Specialist: Progress Note   03/14/21 1658  Mobility  Activity Ambulated in hall  Level of Assistance Contact guard assist, steadying assist  Assistive Device Front wheel walker  Mobility Ambulated with assistance in hallway  Mobility Response Tolerated well  Mobility performed by Mobility specialist;Other (comment) (Assisted PTA Carly)  $Mobility charge 1 Mobility   Assisted PTA with stair training with pt. Pt wheeled to the stairs and ambulated back to the room once complete. Pt back to bed after walk.   [facility] Travis Gallagher Mobility Specialist Mobility Specialist Phone: [phone]

## 2021-03-14 NOTE — Discharge Summary (Addendum)
Physician Discharge Summary  Travis Gallagher STM:[id number] DOB: [birth year] DOA: 02/22/2021  PCP: Pcp, No  Admit date: 02/22/2021 Discharge date: 03/14/2021  Admitted From: home Disposition:  home  Recommendations for Outpatient Follow-up:  Follow up with PCP in 1-2 weeks Please obtain BMP/CBC in one week Patient will follow up with general surgery Outpatient follow up with cardiology for CHF has been arranged Outpatient follow up with GI for evaluation of cirrhosis has been arranged Referral made to vascular surgery to follow up on abdominal aortic aneurysm  Home Health: home health PT, OT, RN Equipment/Devices: rolling walker, 3in1  Discharge Condition:stable CODE STATUS:full code Diet recommendation: heart healthy  Brief/Interim Summary: Travis Gallagher is a 87 y.o. male with medical history significant for hypertension, GERD was admitted to hospital as transfer from [facility] for management of acute pancreatitis in the setting of potential choledocholithiasis. Patient was also found to have acute biliary pancreatitis.  GI was initially consulted but MRCP did not show choledocholithiasis.  Patient underwent laparoscopic cholecystectomy on 02/26/2021.  Cardiology was consulted for preop risk stratification.During hospitalization, post -operatively patient continued to have abdominal pain and distention with renal failure.  He was noted to have duodenal perforation and biliary leak so underwent relaparotomy on 03/01/2021. Post operative course has been unremarkable. He was maintained on TPN support until po intake could be advanced. He is now tolerating solid diet and is felt stable for discharge home.  Discharge Diagnoses:  Principal Problem:   Acute pancreatitis Active Problems:   Severe sepsis (HCC)   Cholangitis   Abdominal pain   Nausea   Hypertension   GERD (gastroesophageal reflux disease)   Elevated troponin   Protein-calorie malnutrition, severe  Acute  biliary pancreatitis:     Patient underwent laparoscopic cholecystectomy by general surgery on 02/26/2021.  Postoperatively, patient continued to have increasing pain and distention and renal failure.  CT scan of the abdomen was performed which showed signs of biliary leak.  Patient was then taken back to the OR on 03/01/2021 and was noted to have a duodenal perforation.  He underwent laparotomy with primary repair of duodenal perforation with Phillip Heal patch by Dr. Zenia Resides on 03/01/21.  Patient underwent upper GI study on 03/06/2021 without note of extravasation.  Contrast flow was demonstrated to the colon. Tolerating soft diet.      Duodenal perforation, biliary leak as a complication of previous procedure.  Status post exploratory laparotomy with repair of duodenal perforation on 03/01/2021. Patient was managed on TPN and diet was slowly advanced. Now tolerating soft diet and TPN has been discontinued. Will continue on PPI     Liver cyst and cirrhosis noted during surgical intervention.  Will need outpatient PCP and GI follow-up.     Severe sepsis/enterobacter and E. coli bacteremia secondary to ascending cholangitis, gangrenous cholecystitis Treated with IV Zosyn, secondary to duodenal perforation.  Patient initially met sepsis criteria on admission. CT of the chest, abdomen and pelvis was negative for pulmonary embolism, also showed multiple fluid collections in the ventral abdomen related to possible seromas versus abscess.  At this point, per surgery he does not need further antibiotics from surgery standpoint. His bacteremia had been adequately treated, so zosyn was discontinued.   Elevated troponin with LV systolic dysfunction, acute on chronic combined congestive heart failure. EKG did not show any ischemic changes.  Possibly secondary to demand ischemia . 2D echocardiogram with left ventricular ejection fraction of 40 to 45% with diffuse hypokinesis and abnormal septal motion wall.  Cardiology evaluated  patient for pre op clearance.  Aspirin on hold due to recent postop status.  This can be readdressed after follow up with general surgery. Continue on coreg. He has been set up with cardiology to follow up on further management of cardiomyopathy   Abdominal aortic aneurysm Follow-up with vascular surgery as outpatient.    PSVT- 2D echo with EF of 45 to 50%.  Continue on coreg.  Will need repeat TSH follow-up as outpatient.   Acute kidney injury Improved.  Latest creatinine of 1.2    Essential hypertension:  Not on antihypertensives at home.  Started on coreg.  Continue to monitor.     GERD: Continue PPI    Debility, weakness.  Physical therapy has seen the patient and recommend home health PT on discharge.  Encouraged ambulation.     Hypophosphatemia.   Resolved after replacement   Anasarca/bilateral pleural effusions -Patient noted to have significant hypoalbuminemia -overall improved with IV lasix -since bun/cr are trending up, will hold further lasix     Discharge Instructions  Discharge Instructions     Diet - low sodium heart healthy   Complete by: As directed    Discharge wound care:   Complete by: As directed    Cleanse with normal saline, pat gently dry. Fill defects with saline moistened roll gauze, top with dry gauze, ABD pads and secure with tape. Change as needed for soiling, otherwise twice daily   Increase activity slowly   Complete by: As directed       Allergies as of 03/14/2021   No Known Allergies      Medication List     STOP taking these medications    sucralfate 1 g tablet Commonly known as: Carafate       TAKE these medications    carvedilol 3.125 MG tablet Commonly known as: COREG Take 1 tablet (3.125 mg total) by mouth 2 (two) times daily with a meal.   famotidine 20 MG tablet Commonly known as: Pepcid Take 1 tablet (20 mg total) by mouth daily.   feeding supplement Liqd Take 237 mLs by mouth 3 (three) times daily between  meals.   pantoprazole 40 MG tablet Commonly known as: PROTONIX Take 1 tablet (40 mg total) by mouth 2 (two) times daily.   traMADol 50 MG tablet Commonly known as: Ultram Take 1 tablet (50 mg total) by mouth every 6 (six) hours as needed.               Durable Medical Equipment  (From admission, onward)           Start     Ordered   03/13/21 1241  For home use only DME 3 n 1  Once        03/13/21 1240   02/27/21 1650  For home use only DME standard manual wheelchair with seat cushion  Once       Comments: Patient suffers from Acute biliary pancreatitis which impairs their ability to perform daily activities like ambulating  in the home.  A cane will not resolve issue with performing activities of daily living. A wheelchair will allow patient to safely perform daily activities. Patient can safely propel the wheelchair in the home or has a caregiver who can provide assistance. Length of need lifetime . Accessories: elevating leg rests (ELRs), wheel locks, extensions and anti-tippers.  Seat and back cushions   02/27/21 1650   02/27/21 1649  For home use only DME Walker rolling  Once  Question Answer Comment  Walker: With Leroy   Patient needs a walker to treat with the following condition Weakness      02/27/21 1648              Discharge Care Instructions  (From admission, onward)           Start     Ordered   03/14/21 0000  Discharge wound care:       Comments: Cleanse with normal saline, pat gently dry. Fill defects with saline moistened roll gauze, top with dry gauze, ABD pads and secure with tape. Change as needed for soiling, otherwise twice daily   03/14/21 1341            Follow-up Information     [facility]. Go to.   Specialty: Primary Care Why: will need to go to clinic and fill out application forms for new patients, or can do this online, once they have completed documents they will make an appointment with  provider  Contact information: [address] [place]        Rolm Bookbinder, MD. Go on 04/06/2021.   Specialty: General Surgery Why: tu cita es el 9 de junio a las 3:30 de la tarde. Por favor traiga una identificacin con foto y tarjeta de seguro si tiene una Contact information: [place] [address] [phone]         Loralie Champagne, PA-C Follow up on 04/05/2021.   Specialty: Gastroenterology Why: 10:00am to discuss further testing for cirrhosis Contact information: [place] [address] [phone]         [facility] Follow up.   Why: Rolling walker and 3n1 arranged- to be delivered to room prior to discharge Contact information: [address] [place] [address] [phone]         [facility] Follow up.   Specialty: Home Health Services Why: HHRN/PT/OT referral made for charity program (pending review) Contact information: [place] [address] [phone]                No Known Allergies  Consultations: Gen surgery GI Cardiology   Procedures/Studies: CT ABDOMEN PELVIS WO CONTRAST  Result Date: 03/01/2021 CLINICAL DATA:  Abdominal pain, fever, recent cholecystectomy EXAM: CT ABDOMEN AND PELVIS WITHOUT CONTRAST TECHNIQUE: Multidetector CT imaging of the abdomen and pelvis was performed following the standard protocol without IV contrast. COMPARISON:  02/22/2021, 02/21/2021 FINDINGS: Lower chest: There are small bilateral pleural effusions with compressive lower lobe atelectasis. Unenhanced CT was performed per clinician order. Lack of IV contrast limits sensitivity and specificity, especially for evaluation of abdominal/pelvic solid viscera. Hepatobiliary: Postsurgical changes are seen from cholecystectomy. There is a 3.7 x 3.2 cm fluid collection in the gallbladder fossa adjacent to the cholecystectomy  clips. Given recent surgical intervention, this could reflect postoperative hematoma or seroma. Evaluation is limited without IV contrast. There is a surgical drain traversing the cholecystectomy site. Extravasated oral contrast is seen along the course of the surgical drain, compatible with perforated duodenum. Please refer to discussion below. Numerous hepatic cysts are identified. No intrahepatic duct dilation. Dilation of the common bile duct measuring 16 mm compatible with cholecystectomy. No evidence of choledocholithiasis. Pancreas: Unremarkable. No pancreatic ductal dilatation or surrounding inflammatory changes. Spleen: Normal in size.  Stable splenic cyst. Adrenals/Urinary Tract: Mild bilateral renal cortical atrophy. No urinary tract calculi  or obstructive uropathy. The adrenals and bladder are unremarkable. Stomach/Bowel: As noted above, there is extravasation of oral contrast along the right lateral margin of the proximal duodenum, reference image 41/3, consistent with duodenal perforation. Oral contrast is seen extending into the right upper quadrant along the course of the indwelling surgical drain. There is no evidence of bowel obstruction. Scattered gas fluid levels are seen throughout the colon, nonspecific. 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 Diffuse atherosclerosis. 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 Reproductive: Prostate is unremarkable. Other: There is a loculated area of fluid within the right lower quadrant measuring 7.4 by 5.6 by 8.3 cm. Fluid is low-attenuation, without any evidence of secondary infection on this limited unenhanced exam. There is a small amount of pneumoperitoneum throughout the upper abdomen, likely a combination of postoperative change, indwelling surgical drain, and the perforated duodenum as described above. No abdominal wall hernia. Body  wall edema is identified greatest in the bilateral flanks. Musculoskeletal: There are no acute or destructive bony lesions. Reconstructed images demonstrate no additional findings. IMPRESSION: 1. Perforation of the proximal duodenum, with extravasated oral contrast in the right upper quadrant. 2. Minimal pneumoperitoneum within the upper abdomen, consistent with recent surgical intervention, internal drain, and perforated duodenum as described above. Favor postoperative seroma or hematoma. 3. Postsurgical changes from cholecystectomy, with indwelling surgical drain. Small fluid collection in the gallbladder fossa is nonspecific on this unenhanced exam. 4. Loculated simple appearing fluid in the right lower quadrant, nonspecific. No internal gas or septations identified on this limited unenhanced exam. 5. No evidence of bowel obstruction or ileus. 6. 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 Recommend follow-up ultrasound every 3 years. This recommendation follows ACR consensus guidelines: White Paper of the ACR Incidental Findings Committee II on Vascular Findings. J Am Coll Radiol 2013; 10:789-794. 7. Bilateral pleural effusions with compressive atelectasis of the bilateral lower lobes. Critical Value/emergent results were called by telephone at the time of interpretation on 03/01/2021 at 4:39 pm to provider [facility], who verbally acknowledged these results. Electronically Signed   By: Randa Ngo M.D.   On: 03/01/2021 16:45   DG Chest 2 View  Result Date: 02/21/2021 CLINICAL DATA:  Worsening abdominal pain radiating into the chest. EXAM: CHEST - 2 VIEW COMPARISON:  CT abdomen pelvis February 20, 2021 FINDINGS: The heart size and mediastinal contours are within normal limits. Low lung volumes. Streaky left basilar opacities. No visible pleural effusion or pneumothorax. Thoracic spondylosis. Degenerative changes bilateral shoulders. IMPRESSION: Low  lung volumes with streaky left basilar opacities, which may represent atelectasis or infiltrate. Electronically Signed   By: Dahlia Bailiff MD   On: 02/21/2021 20:44   CT ANGIO CHEST PE W OR WO CONTRAST  Result Date: 03/07/2021 CLINICAL DATA:  Abdominal pain, fever, postoperative EXAM: CT ANGIOGRAPHY CHEST WITH CONTRAST TECHNIQUE: Multidetector CT imaging of the chest was performed using the standard protocol during bolus administration of intravenous contrast. Multiplanar CT image reconstructions and MIPs were obtained to evaluate the vascular anatomy. CONTRAST:  115m OMNIPAQUE IOHEXOL 350 MG/ML SOLN COMPARISON:  03/01/2021 FINDINGS: Cardiovascular: Satisfactory opacification of the pulmonary arteries to the segmental level. No evidence of pulmonary embolism. Normal heart size. Left coronary artery calcifications. No pericardial effusion. Severe aortic atherosclerosis. Mediastinum/Nodes: No enlarged mediastinal, hilar, or axillary lymph nodes. Thyroid gland, trachea, and esophagus demonstrate no significant findings. Lungs/Pleura: Moderate  bilateral pleural effusions and associated atelectasis or consolidation. Upper Abdomen: Please see separately dictated examination of the abdomen and pelvis. Musculoskeletal: No chest wall abnormality. No acute or significant osseous findings. Review of the MIP images confirms the above findings. IMPRESSION: 1. Negative examination for pulmonary embolism. 2. Moderate bilateral pleural effusions and associated atelectasis or consolidation. 3. Coronary artery disease. Aortic Atherosclerosis (ICD10-I70.0). Electronically Signed   By: Eddie Candle M.D.   On: 03/07/2021 12:54   CT Angio Chest PE W and/or Wo Contrast  Result Date: 02/21/2021 CLINICAL DATA:  Worsening abdominal pain radiating to the chest. EXAM: CT ANGIOGRAPHY CHEST WITH CONTRAST TECHNIQUE: Multidetector CT imaging of the chest was performed using the standard protocol during bolus administration of intravenous  contrast. Multiplanar CT image reconstructions and MIPs were obtained to evaluate the vascular anatomy. CONTRAST:  74m OMNIPAQUE IOHEXOL 350 MG/ML SOLN COMPARISON:  CT abdomen pelvis February 20, 2021. FINDINGS: Cardiovascular: Satisfactory opacification of the pulmonary arteries to the segmental level. No evidence of pulmonary embolism. Aortic atherosclerosis. The ascending aorta measures 3.4 cm in maximal diameter. There are few areas of focal enlargement involving the thoracic aorta 1 in the aortic arch measures 3.6 cm in maximal diameter and another in the descending aorta measures 3.6 cm in maximal diameter. The heart size. No pericardial effusion. Mediastinum/Nodes: No enlarged mediastinal, hilar, or axillary lymph nodes. Thyroid gland, trachea, and esophagus demonstrate no significant findings. Lungs/Pleura: Bibasilar atelectasis. Left upper lobe calcified granuloma. Mild emphysematous change. No pleural effusion. No pneumothorax. Upper Abdomen: Polycystic liver disease. Distended gallbladder without signs gallbladder inflammation. 2.9 cm cyst in the spleen. Musculoskeletal: Multilevel degenerative changes spine. No acute osseous abnormality. Review of the MIP images confirms the above findings. IMPRESSION: 1. No evidence of pulmonary embolism. 2. Focal areas of aortic ectasia involving the thoracic aorta, involving the aortic arch and descending aorta both of which measure 3.6 cm in maximal diameter. Recommend annual imaging followup by CTA or MRA. This recommendation follows 2010 ACCF/AHA/AATS/ACR/ASA/SCA/SCAI/SIR/STS/SVM Guidelines for the Diagnosis and Management of Patients with Thoracic Aortic Disease. Circulation.2010; 121:: K270-W237 Aortic aneurysm NOS (ICD10-I71.9) 3. Polycystic liver disease. 4. Distended gallbladder without signs gallbladder inflammation. 5. Emphysema and aortic atherosclerosis. Aortic Atherosclerosis (ICD10-I70.0) and Emphysema (ICD10-J43.9). Electronically Signed   By: JDahlia BailiffMD   On: 02/21/2021 23:43   CT ABDOMEN PELVIS W CONTRAST  Result Date: 03/07/2021 CLINICAL DATA:  Abdominal pain, fever, postoperative, status post recent cholecystectomy and duodenal perforation repair EXAM: CT ABDOMEN AND PELVIS WITH CONTRAST TECHNIQUE: Multidetector CT imaging of the abdomen and pelvis was performed using the standard protocol following bolus administration of intravenous contrast. CONTRAST:  1068mOMNIPAQUE IOHEXOL 350 MG/ML SOLN COMPARISON:  03/01/2021 FINDINGS: Lower chest: Please see separately dictated CT examination of the chest. Hepatobiliary: No solid liver abnormality is seen. Numerous cysts throughout the liver parenchyma. Status post cholecystectomy. No biliary ductal dilatation. Surgical drain is present in the gallbladder fossa. No fluid collection identified. Pancreas: Unremarkable. No pancreatic ductal dilatation or surrounding inflammatory changes. Spleen: Normal in size without significant abnormality. Adrenals/Urinary Tract: Adrenal glands are unremarkable. Kidneys are normal, without renal calculi, solid lesion, or hydronephrosis. Bladder is unremarkable. Stomach/Bowel: Stomach is within normal limits. Appendix appears normal. No evidence of bowel wall thickening, distention, or inflammatory changes. Vascular/Lymphatic: Aortic atherosclerosis. 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 No enlarged abdominal or pelvic lymph nodes. Reproductive: No mass or other significant abnormality. Other: Interval midline laparotomy. Interval decrease in attenuation of a rim enhancing fluid collection overlying the right abdominal wall musculature, measuring 14.1 x 9.2 x 2.1 cm (series 7, image 67). There are multiple fluid loculations in the ventral abdomen about  the gastrohepatic ligament, porta hepatis, and right hemicolon, the largest located centrally measuring approximately 5.1 x 3.7 cm (series 7, image 38). Small volume free ascites throughout the abdomen and pelvis. Musculoskeletal: No acute or significant osseous findings. IMPRESSION: 1. Interval midline laparotomy. 2. Surgical drain is present in the gallbladder fossa. No fluid collection identified in the gallbladder fossa. 3. There are multiple fluid loculations in the ventral abdomen about the gastrohepatic ligament, porta hepatis, and right hemicolon, the largest located centrally measuring approximately 5.1 x 3.7 cm. These may reflect seroma or abscess in the postoperative setting. The presence or absence of infection is not established by CT. 4. Small volume free ascites throughout the abdomen and pelvis. 5. 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 Recommend nonemergent vascular consultation if not already obtained and if clinically appropriate. This recommendation follows ACR consensus guidelines: White Paper of the ACR Incidental Findings Committee II on Vascular Findings. J Am Coll Radiol 2013; 10:789-794. Aortic Atherosclerosis (ICD10-I70.0). Electronically Signed   By: Eddie Candle M.D.   On: 03/07/2021 13:10   CT ABDOMEN PELVIS W CONTRAST  Result Date: 02/21/2021 CLINICAL DATA:  Persistent abdominal pain EXAM: CT ABDOMEN AND PELVIS WITH CONTRAST TECHNIQUE: Multidetector CT imaging of the abdomen and pelvis was performed using the standard protocol following bolus administration of intravenous contrast. CONTRAST:  28m OMNIPAQUE IOHEXOL 350 MG/ML SOLN COMPARISON:  February 20, 2021 and same-day ultrasound FINDINGS: Lower chest: Bibasilar atelectasis. Hepatobiliary: Numerous hepatic cysts consistent with  polycystic liver disease. Gallbladder is distended without findings of acute inflammation. Similar dilation of the common duct measuring up to 10 mm Pancreas: Peripancreatic stranding. No pancreatic ductal dilation. No walled off collection. Spleen: 2. 9 cm cyst in the spleen Adrenals/Urinary Tract: Adrenal glands are unremarkable. Kidneys are normal, without renal calculi, focal lesion, or hydronephrosis. Bladder is unremarkable. Stomach/Bowel: Similar sub mucosal edema in the proximal stomach. No pathologically dilated loops of small bowel. Normal appendix. No suspicious colonic wall thickening or mass like lesions. Vascular/Lymphatic: The portal vein, splenic vein and SMV are patent. Aortic atherosclerosis. Infrarenal abdominal aortic aneurysm measuring 3 cm on image 40/2. 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 No pathologically enlarged abdominal or pelvic lymph nodes. Reproductive: Mild prostatic enlargement. Other: Peripancreatic fluid. No abdominal ascites or walled off fluid collections. No pneumoperitoneum. Musculoskeletal: Multilevel degenerative changes spine. No acute osseous abnormality. IMPRESSION: 1. Peripancreatic stranding most consistent with acute interstitial pancreatitis, recommend correlation with serum lipase. No walled off fluid collections or evidence of pancreatic necrosis. 2. Similar submucosal edema in the proximal stomach suggestive of gastritis. 3. Stable dilation of the common duct measuring up to 10 mm, favored to represent benign senescent dilation. 4. 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 5. Infrarenal abdominal aortic aneurysm measuring 3 cm. Recommend follow-up ultrasound every 3 years. This recommendation follows ACR consensus guidelines: White Paper of the ACR Incidental Findings Committee II on Vascular Findings. J Am Coll Radiol  2013; 58:832-549. 6. Aortic atherosclerosis.  Aortic Atherosclerosis (ICD10-I70.0).  Electronically Signed   By: Dahlia Bailiff MD   On: 02/21/2021 23:53   CT ABDOMEN PELVIS W CONTRAST  Result Date: 02/20/2021 CLINICAL DATA:  RIGHT-sided abdominal pain EXAM: CT ABDOMEN AND PELVIS WITH CONTRAST TECHNIQUE: Multidetector CT imaging of the abdomen and pelvis was performed using the standard protocol following bolus administration of intravenous contrast. CONTRAST:  131m OMNIPAQUE IOHEXOL 300 MG/ML  SOLN COMPARISON:  None. FINDINGS: Lower chest: Lung bases are clear. Hepatobiliary: Multiple round simple fluid cysts of varying size throughout the liver parenchyma. No biliary duct dilatation. Common bile duct is mildly dilated 9 mm. No obstructing lesion. Pancreas: Pancreas is normal. No ductal dilatation. No pancreatic inflammation. Spleen: Cyst within the medial aspect of the spleen measures 2.7 cm. Adrenals/urinary tract: Adrenal glands and kidneys are normal. The ureters and bladder normal. Stomach/Bowel: Mild submucosal thickening in the gastric fundus and cardia (image 27/series 2 and image 74/series 6). The gastric body and antrum appear normal. Small bowel is normal. No evidence of bowel obstruction or inflammation. Appendix normal. The colon and rectosigmoid colon are normal. Vascular/Lymphatic: Calcification abdominal aorta. 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 No lymphadenopathy Reproductive: . prostate unremarkable. Other: No free fluid. Musculoskeletal: No aggressive osseous lesion. IMPRESSION: 1. Submucosal edema in the proximal stomach. Findings suggest gastritis. 2. Multiple benign cysts within liver. 3. Mild dilatation of common bile duct is favored benign senescent dilatation. Electronically Signed   By: SSuzy BouchardM.D.   On: 02/20/2021 19:57   DG CHEST PORT 1 VIEW  Result Date: 03/05/2021 CLINICAL DATA:  Shortness of breath. Status post exploratory laparotomy. EXAM: PORTABLE CHEST 1 VIEW COMPARISON:  03/02/2021 FINDINGS: Poor inspiration. Normal  sized heart. Small left pleural effusion without significant change. Persistent dense airspace opacity in the left lower lobe. Otherwise, decreased airspace opacity at both lung bases. Thoracic spine degenerative changes. IMPRESSION: 1. Improved bibasilar atelectasis. 2. Stable dense pneumonia or atelectasis in the left lower lobe. Electronically Signed   By: SClaudie ReveringM.D.   On: 03/05/2021 12:23   DG Chest Portable 1 View  Result Date: 03/02/2021 CLINICAL DATA:  Nasogastric tube insertion EXAM: PORTABLE CHEST 1 VIEW COMPARISON:  02/21/2021 FINDINGS: Enteric tube with tip and side-port over the stomach. Postoperative right upper quadrant with drain. Worsening chest with hazy density on the right more than left. No superimposed Kerley lines or pneumothorax. Normal heart size. IMPRESSION: 1. New enteric tube in good position. 2. Low volume chest with atelectasis and pleural effusions. Electronically Signed   By: JMonte FantasiaM.D.   On: 03/02/2021 04:06   DG Abd Portable 1V  Result Date: 03/06/2021 CLINICAL DATA:  Abdominal distension EXAM: PORTABLE ABDOMEN - 1 VIEW COMPARISON:  Films from recent upper GI FINDINGS: Scattered large and small bowel gas is noted. Previously administered contrast from upper GI now lies within the colon. The appendix is within normal limits. Postsurgical changes with a right upper quadrant drain are noted. No obstructive changes are seen. No definitive free air is identified. IMPRESSION: Previously administered contrast now lies throughout the colon. No obstructive changes are seen. Postsurgical changes with right upper quadrant drain are noted. No acute abnormality is seen. Electronically Signed   By: MInez CatalinaM.D.   On: 03/06/2021 16:55   DG Abd Portable 1V  Result Date: 02/28/2021 CLINICAL DATA:  Ileus.  Pancreatitis.  Recent cholecystectomy EXAM: PORTABLE ABDOMEN - 1 VIEW COMPARISON:  CT abdomen and  pelvis February 21, 2021; MR abdomen February 22, 2021 FINDINGS: Loops of  mildly dilated small bowel noted without air-fluid levels. No free air evident on supine examination. Surgical clips noted in gallbladder fossa region. Surgical drain noted in the lateral right abdomen. IMPRESSION: Loops of mildly dilated small bowel without air-fluid levels likely represent postoperative ileus. Drain in lateral right mid abdomen. Surgical clips gallbladder fossa. No free air evident on supine examination. Electronically Signed   By: Lowella Grip III M.D.   On: 02/28/2021 15:59   DG UGI W SINGLE CM (SOL OR THIN BA)  Result Date: 03/06/2021 CLINICAL DATA:  Status post duodenal perforation repair. EXAM: WATER SOLUBLE UPPER GI SERIES TECHNIQUE: Single-column upper GI series was performed using water soluble contrast. CONTRAST:  OMNIPAQUE IOHEXOL 300 MG/ML  SOLN COMPARISON:  CT 03/01/2021. FLUOROSCOPY TIME:  Fluoroscopy Time:  3 minutes and 24 seconds Radiation Exposure Index (if provided by the fluoroscopic device): 126.6 mGy Number of Acquired Spot Images: FINDINGS: Pre-procedure KUB shows a nonspecific gas pattern with surgical drain in the right upper quadrant. Challenging study due to patient immobility and pain with motion. We did elevate the head of the fluoro table as much as possible and put the patient in a right-side-down position relative to the fluoro table to promote gastric emptying. Stomach is nondistended. Water-soluble contrast did migrate through the pylorus into the duodenal bulb, through the duodenum and into the proximal jejunum. There is no evidence for contrast extravasation from the duodenum. No evidence for contrast entering the right upper quadrant surgical drain. IMPRESSION: No demonstrable contrast extravasation from the duodenum to suggest leak. Electronically Signed   By: Misty Stanley M.D.   On: 03/06/2021 10:22   MR ABDOMEN MRCP W WO CONTAST  Result Date: 02/23/2021 CLINICAL DATA:  Evaluate for choledocholithiasis. EXAM: MRI ABDOMEN WITHOUT AND WITH CONTRAST  (INCLUDING MRCP) TECHNIQUE: Multiplanar multisequence MR imaging of the abdomen was performed both before and after the administration of intravenous contrast. Heavily T2-weighted images of the biliary and pancreatic ducts were obtained, and three-dimensional MRCP images were rendered by post processing. CONTRAST:  6.79m GADAVIST GADOBUTROL 1 MMOL/ML IV SOLN COMPARISON:  02/21/2021 FINDINGS: Exam detail is diminished due to motion artifact. Lower chest: Small bilateral pleural effusions with overlying atelectasis. Hepatobiliary: Innumerable cysts are identified throughout both lobes of liver compatible with polycystic liver disease. Mild gallbladder wall thickening measures up to 4.6 mm. No gallstones identified. Fusiform dilatation of the common bile duct measures up to 1.4 cm. No signs of choledocholithiasis at this time. Pancreas: Interstitial edema and peripancreatic fat stranding identified. No main duct dilatation. No signs of pancreatic necrosis or pseudocyst formation. No pancreatic mass identified at this time. Spleen: 3 cm cyst identified. Spleen is otherwise normal in appearance and size. Adrenals/Urinary Tract: Normal appearance of the adrenal glands. No hydronephrosis identified bilaterally. Stomach/Bowel: Visualized portions within the abdomen are unremarkable. Vascular/Lymphatic: Extensive aortic atherosclerosis. Infrarenal abdominal aortic aneurysm measures 3.1 cm. No abdominal adenopathy. Other: Soft tissue edema extends from the pancreas into the retroperitoneal fat bilaterally. Mild perisplenic and perihepatic ascites. Musculoskeletal: No suspicious bone lesions identified. IMPRESSION: 1. Acute pancreatitis. No signs of pancreatic necrosis or pseudocyst formation. 2. Mild gallbladder wall thickening without gallstones. 3. Mild fusiform dilatation of the common bile duct measuring up to 1.4 cm. No signs of choledocholithiasis at this time. 4. Hepatic and splenic cysts. 5. Small bilateral pleural  effusions with overlying atelectasis. 6. 3.1 cm infrarenal abdominal aortic aneurysm. Recommend follow-up ultrasound every 3 years.  This recommendation follows ACR consensus guidelines: White Paper of the ACR Incidental Findings Committee II on Vascular Findings. J Am Coll Radiol 2013; 10:789-794. Electronically Signed   By: Kerby Moors M.D.   On: 02/23/2021 07:33   ECHOCARDIOGRAM COMPLETE  Result Date: 02/24/2021    ECHOCARDIOGRAM REPORT   Patient Name:   Travis Gallagher [facility] Date of Exam: 02/24/2021 Medical Rec #:  [id number]                 Height:       67.0 in Accession #:    [id number]                Weight:       140.0 lb Date of Birth:  1931/06/12                 BSA:          1.738 m Patient Age:    18 years                  BP:           176/81 mmHg Patient Gender: M                         HR:           89 bpm. Exam Location:  Inpatient Procedure: 2D Echo, 3D Echo, Cardiac Doppler and Color Doppler Indications:     Elevated troponin.; R07.9* Chest pain, unspecified  History:         Patient has no prior history of Echocardiogram examinations.                  Signs/Symptoms:Bacteremia; Risk Factors:Hypertension. Elevated                  troponin.  Sonographer:     Roseanna Rainbow RDCS Referring Phys:  [id number] [place] Diagnosing Phys: Jenkins Rouge MD  Sonographer Comments: Technically difficult study due to poor echo windows. Patient moving throughout study IMPRESSIONS  1. Diffuse hypokinesis abnromal septal motion worse in inferior basal wall. Left ventricular ejection fraction, by estimation, is 45 to 50%. The left ventricle has mildly decreased function. The left ventricle demonstrates global hypokinesis. There is moderate left ventricular hypertrophy. Left ventricular diastolic parameters are consistent with Grade I diastolic dysfunction (impaired relaxation).  2. Right ventricular systolic function is normal. The right ventricular size is normal. There is mildly elevated pulmonary artery  systolic pressure.  3. The pericardial effusion is posterior to the left ventricle.  4. The mitral valve is abnormal. Mild to moderate mitral valve regurgitation. No evidence of mitral stenosis.  5. The aortic valve is tricuspid. There is moderate calcification of the aortic valve. There is moderate thickening of the aortic valve. Aortic valve regurgitation is mild. Mild to moderate aortic valve sclerosis/calcification is present, without any evidence of aortic stenosis.  6. Aortic dilatation noted. There is mild dilatation of the ascending aorta, measuring 40 mm.  7. The inferior vena cava is normal in size with greater than 50% respiratory variability, suggesting right atrial pressure of 3 mmHg. FINDINGS  Left Ventricle: Diffuse hypokinesis abnromal septal motion worse in inferior basal wall. Left ventricular ejection fraction, by estimation, is 45 to 50%. The left ventricle has mildly decreased function. The left ventricle demonstrates global hypokinesis. The left ventricular internal cavity size was normal in size. There is moderate left ventricular hypertrophy. Left ventricular diastolic parameters are consistent with Grade  I diastolic dysfunction (impaired relaxation). Right Ventricle: The right ventricular size is normal. No increase in right ventricular wall thickness. Right ventricular systolic function is normal. There is mildly elevated pulmonary artery systolic pressure. The tricuspid regurgitant velocity is 2.72  m/s, and with an assumed right atrial pressure of 8 mmHg, the estimated right ventricular systolic pressure is 10.6 mmHg. Left Atrium: Left atrial size was normal in size. Right Atrium: Right atrial size was normal in size. Pericardium: Trivial pericardial effusion is present. The pericardial effusion is posterior to the left ventricle. Mitral Valve: The mitral valve is abnormal. There is mild thickening of the mitral valve leaflet(s). There is mild calcification of the mitral valve leaflet(s).  Mild to moderate mitral valve regurgitation. No evidence of mitral valve stenosis. Tricuspid Valve: The tricuspid valve is normal in structure. Tricuspid valve regurgitation is not demonstrated. No evidence of tricuspid stenosis. Aortic Valve: The aortic valve is tricuspid. There is moderate calcification of the aortic valve. There is moderate thickening of the aortic valve. Aortic valve regurgitation is mild. Aortic regurgitation PHT measures 406 msec. Mild to moderate aortic valve sclerosis/calcification is present, without any evidence of aortic stenosis. Pulmonic Valve: The pulmonic valve was normal in structure. Pulmonic valve regurgitation is not visualized. No evidence of pulmonic stenosis. Aorta: The aortic root is normal in size and structure and aortic dilatation noted. There is mild dilatation of the ascending aorta, measuring 40 mm. Venous: The inferior vena cava is normal in size with greater than 50% respiratory variability, suggesting right atrial pressure of 3 mmHg. IAS/Shunts: No atrial level shunt detected by color flow Doppler.  LEFT VENTRICLE PLAX 2D LVIDd:         3.20 cm     Diastology LVIDs:         2.20 cm     LV e' medial:    5.11 cm/s LV PW:         1.30 cm     LV E/e' medial:  16.0 LV IVS:        1.40 cm     LV e' lateral:   7.18 cm/s LVOT diam:     2.00 cm     LV E/e' lateral: 11.4 LV SV:         79 LV SV Index:   45 LVOT Area:     3.14 cm  LV Volumes (MOD) LV vol d, MOD A2C: 80.0 ml LV vol d, MOD A4C: 65.0 ml LV vol s, MOD A2C: 35.3 ml LV vol s, MOD A4C: 37.2 ml LV SV MOD A2C:     44.7 ml LV SV MOD A4C:     65.0 ml LV SV MOD BP:      39.1 ml RIGHT VENTRICLE             IVC RV S prime:     14.30 cm/s  IVC diam: 1.40 cm TAPSE (M-mode): 2.4 cm LEFT ATRIUM             Index       RIGHT ATRIUM           Index LA diam:        2.90 cm 1.67 cm/m  RA Area:     12.40 cm LA Vol (A2C):   55.1 ml 31.71 ml/m RA Volume:   25.30 ml  14.56 ml/m LA Vol (A4C):   35.2 ml 20.26 ml/m LA Biplane Vol:  46.8 ml 26.93 ml/m  AORTIC VALVE LVOT Vmax:   129.00  cm/s LVOT Vmean:  85.100 cm/s LVOT VTI:    0.250 m AI PHT:      406 msec  AORTA Ao Root diam: 3.60 cm Ao Asc diam:  4.00 cm MITRAL VALVE                 TRICUSPID VALVE MV Area (PHT): 5.02 cm      TR Peak grad:   29.6 mmHg MV Decel Time: 151 msec      TR Vmax:        272.00 cm/s MR Peak grad:    122.8 mmHg MR Mean grad:    95.0 mmHg   SHUNTS MR Vmax:         554.00 cm/s Systemic VTI:  0.25 m MR Vmean:        474.0 cm/s  Systemic Diam: 2.00 cm MR PISA:         1.57 cm MR PISA Eff ROA: 11 mm MR PISA Radius:  0.50 cm MV E velocity: 82.00 cm/s MV A velocity: 120.00 cm/s MV E/A ratio:  0.68 Jenkins Rouge MD Electronically signed by Jenkins Rouge MD Signature Date/Time: 02/24/2021/1:44:11 PM    Final    VAS [place] LOWER EXTREMITY VENOUS (DVT)  Result Date: 03/07/2021  Lower Venous DVT Study Patient Name:  Travis Gallagher [facility]  Date of Exam:   03/07/2021 Medical Rec #: [id number]                  Accession #:    [id number] Date of Birth: 1931-04-28                  Patient Gender: M Patient Age:   89Y Exam Location:  [facility] Procedure:      VAS [place] LOWER EXTREMITY VENOUS (DVT) Referring Phys: [id number] [place] --------------------------------------------------------------------------------  Indications: Swelling. Other Indications: Acute pancreatitis with swelling of abdomen and pelvis. Comparison Study: No previous exams Performing Technologist: Jody Hill RVT, RDMS  Examination Guidelines: A complete evaluation includes B-mode imaging, spectral Doppler, color Doppler, and power Doppler as needed of all accessible portions of each vessel. Bilateral testing is considered an integral part of a complete examination. Limited examinations for reoccurring indications may be performed as noted. The reflux portion of the exam is performed with the patient in reverse Trendelenburg.  +---------+---------------+---------+-----------+----------+--------------+  RIGHT    CompressibilityPhasicitySpontaneityPropertiesThrombus Aging +---------+---------------+---------+-----------+----------+--------------+ CFV      Full           Yes      Yes                                 +---------+---------------+---------+-----------+----------+--------------+ SFJ      Full                                                        +---------+---------------+---------+-----------+----------+--------------+ FV Prox  Full           Yes      Yes                                 +---------+---------------+---------+-----------+----------+--------------+ FV Mid   Full           Yes  Yes                                 +---------+---------------+---------+-----------+----------+--------------+ FV DistalFull           Yes      Yes                                 +---------+---------------+---------+-----------+----------+--------------+ PFV      Full                                                        +---------+---------------+---------+-----------+----------+--------------+ POP      Full           Yes      Yes                                 +---------+---------------+---------+-----------+----------+--------------+ PTV      Full                                                        +---------+---------------+---------+-----------+----------+--------------+ PERO     Full                                                        +---------+---------------+---------+-----------+----------+--------------+   +---------+---------------+---------+-----------+----------+--------------+ LEFT     CompressibilityPhasicitySpontaneityPropertiesThrombus Aging +---------+---------------+---------+-----------+----------+--------------+ CFV      Full           Yes      Yes                                 +---------+---------------+---------+-----------+----------+--------------+ SFJ      Full                                                         +---------+---------------+---------+-----------+----------+--------------+ FV Prox  Full           Yes      Yes                                 +---------+---------------+---------+-----------+----------+--------------+ FV Mid   Full           Yes      Yes                                 +---------+---------------+---------+-----------+----------+--------------+ FV DistalFull           Yes      Yes                                 +---------+---------------+---------+-----------+----------+--------------+  PFV      Full                                                        +---------+---------------+---------+-----------+----------+--------------+ POP      Full           Yes      Yes                                 +---------+---------------+---------+-----------+----------+--------------+ PTV      Full                                                        +---------+---------------+---------+-----------+----------+--------------+ PERO     Full                                                        +---------+---------------+---------+-----------+----------+--------------+     Summary: BILATERAL: - No evidence of deep vein thrombosis seen in the lower extremities, bilaterally. - No evidence of superficial venous thrombosis in the lower extremities, bilaterally. -No evidence of popliteal cyst, bilaterally.   *See table(s) above for measurements and observations. Electronically signed by Curt Jews MD on 03/07/2021 at 3:48:52 PM.    Final    [place] EKG SITE RITE  Result Date: 03/02/2021 If Site Rite image not attached, placement could not be confirmed due to current cardiac rhythm.  US ABDOMEN LIMITED RUQ (LIVER/GB)  Result Date: 02/21/2021 CLINICAL DATA:  Epigastric pain since yesterday. EXAM: ULTRASOUND ABDOMEN LIMITED RIGHT UPPER QUADRANT COMPARISON:  CT abdomen and pelvis 02/20/2021 FINDINGS: Gallbladder: Cholelithiasis with several  small stones demonstrated in the gallbladder. No gallbladder wall thickening or edema. Murphy's sign is normal. Common bile duct: Diameter: 11 mm, dilated.  Similar to prior CT. Liver: Numerous cysts throughout the liver consistent with polycystic liver disease. No solid masses identified. Portal vein is patent on color Doppler imaging with normal direction of blood flow towards the liver. Other: None. IMPRESSION: Cholelithiasis without evidence of acute cholecystitis. Polycystic liver disease. Moderate extrahepatic bile duct dilatation, cause not determined. Electronically Signed   By: Lucienne Capers M.D.   On: 02/21/2021 23:07      Subjective: Feels that abdominal pain is controled, no vomiting, had a BM yesterday  Discharge Exam: Vitals:   03/14/21 0341 03/14/21 0753 03/14/21 1104 03/14/21 1546  BP: 129/64 (!) 140/57 (!) 134/55 (!) 142/58  Pulse: 80 79 78 78  Resp: _0 Temp: 97.6 F (36.4 C) 98.7 F (37.1 C) 97.6 F (36.4 C) 97.8 F (36.6 C)  TempSrc: Oral Oral Oral Oral  SpO2: 93% 91% 95% 97%  Weight: 63.1 kg       General: Pt is alert, awake, not in acute distress Cardiovascular: RRR, S1/S2 +, no rubs, no gallops Respiratory: CTA bilaterally, no wheezing, no rhonchi Abdominal: Soft, NT, ND, bowel sounds + Extremities: no edema, no cyanosis    The results of significant  diagnostics from this hospitalization (including imaging, microbiology, ancillary and laboratory) are listed below for reference.     Microbiology: Recent Results (from the past 240 hour(s))  Culture, blood (Routine X 2) w Reflex to ID Panel     Status: None   Collection Time: 03/05/21  5:30 PM   Specimen: BLOOD  Result Value Ref Range Status   Specimen Description BLOOD LEFT ANTECUBITAL  Final   Special Requests   Final    BOTTLES DRAWN AEROBIC ONLY Blood Culture adequate volume   Culture   Final    NO GROWTH 5 DAYS Performed at [facility], [address], [place], [place]  [address]    Report Status 03/10/2021 FINAL  Final  Culture, blood (Routine X 2) w Reflex to ID Panel     Status: None   Collection Time: 03/05/21  5:36 PM   Specimen: BLOOD LEFT WRIST  Result Value Ref Range Status   Specimen Description BLOOD LEFT WRIST  Final   Special Requests   Final    BOTTLES DRAWN AEROBIC ONLY Blood Culture adequate volume   Culture   Final    NO GROWTH 5 DAYS Performed at [facility], [place] [address] [place], [place] [address]    Report Status 03/10/2021 FINAL  Final     Labs: BNP (last 3 results) No results for input(s): BNP in the last 8760 hours. Basic Metabolic Panel: Recent Labs  Lab 03/09/21 0451 03/10/21 0500 03/11/21 0500 03/12/21 0810 03/13/21 0749  NA 138 137 139 138 140  K 3.8 3.8 4.3 3.9 3.5  CL 109 105 108 108 110  CO2 _0 GLUCOSE 126* 118* 117* 95 110*  BUN 29* 29* 33* 42* 45*  CREATININE 1.07 1.12 1.23 1.31* 1.25*  CALCIUM 7.8* 8.3* 7.9* 7.8* 7.8*  MG 2.2  --   --   --   --   PHOS 3.3 3.3 3.7 4.2 3.6   Liver Function Tests: Recent Labs  Lab 03/08/21 0339 03/09/21 0451 03/10/21 0500 03/11/21 0500 03/12/21 0810 03/13/21 0749  AST 26 31  --   --   --   --   ALT 19 24  --   --   --   --   ALKPHOS 90 85  --   --   --   --   BILITOT 0.7 0.6  --   --   --   --   PROT 5.0* 5.5*  --   --   --   --   ALBUMIN 1.6* 2.1* 3.0* 2.5* 2.4* 2.3*   No results for input(s): LIPASE, AMYLASE in the last 168 hours. No results for input(s): AMMONIA in the last 168 hours. CBC: Recent Labs  Lab 03/09/21 1054 03/10/21 0500 03/11/21 0500 03/12/21 0810 03/13/21 0500  WBC 14.8* 13.5* 12.1* 13.1* 10.2  HGB 9.9* 8.7* 8.6* 8.9* 9.0*  HCT 31.4* 26.9* 26.7* 27.2* 27.9*  MCV 92.1 89.7 89.6 89.8 90.9  PLT 226 219 234 254 267   Cardiac Enzymes: No results for input(s): CKTOTAL, CKMB, CKMBINDEX, TROPONINI in the last 168 hours. BNP: Invalid input(s): POCBNP CBG: Recent Labs  Lab 03/10/21 1614 03/10/21 2128 03/11/21 0603  03/11/21 1115 03/11/21 1759  GLUCAP 137* 143* 146* 145* 128*   D-Dimer No results for input(s): DDIMER in the last 72 hours. Hgb A1c No results for input(s): HGBA1C in the last 72 hours. Lipid Profile No results for input(s): CHOL, HDL, LDLCALC, TRIG, CHOLHDL,  LDLDIRECT in the last 72 hours. Thyroid function studies No results for input(s): TSH, T4TOTAL, T3FREE, THYROIDAB in the last 72 hours.  Invalid input(s): FREET3 Anemia work up No results for input(s): VITAMINB12, FOLATE, FERRITIN, TIBC, IRON, RETICCTPCT in the last 72 hours. Urinalysis    Component Value Date/Time   COLORURINE YELLOW 03/06/2021 0304   APPEARANCEUR CLEAR 03/06/2021 0304   LABSPEC 1.014 03/06/2021 0304   PHURINE 9.0 (H) 03/06/2021 0304   GLUCOSEU NEGATIVE 03/06/2021 0304   HGBUR NEGATIVE 03/06/2021 0304   BILIRUBINUR NEGATIVE 03/06/2021 0304   KETONESUR NEGATIVE 03/06/2021 0304   PROTEINUR 30 (A) 03/06/2021 0304   NITRITE NEGATIVE 03/06/2021 0304   LEUKOCYTESUR NEGATIVE 03/06/2021 0304   Sepsis Labs Invalid input(s): PROCALCITONIN,  WBC,  LACTICIDVEN Microbiology Recent Results (from the past 240 hour(s))  Culture, blood (Routine X 2) w Reflex to ID Panel     Status: None   Collection Time: 03/05/21  5:30 PM   Specimen: BLOOD  Result Value Ref Range Status   Specimen Description BLOOD LEFT ANTECUBITAL  Final   Special Requests   Final    BOTTLES DRAWN AEROBIC ONLY Blood Culture adequate volume   Culture   Final    NO GROWTH 5 DAYS Performed at [facility], [place] [address], [place], [place] [address]    Report Status 03/10/2021 FINAL  Final  Culture, blood (Routine X 2) w Reflex to ID Panel     Status: None   Collection Time: 03/05/21  5:36 PM   Specimen: BLOOD LEFT WRIST  Result Value Ref Range Status   Specimen Description BLOOD LEFT WRIST  Final   Special Requests   Final    BOTTLES DRAWN AEROBIC ONLY Blood Culture adequate volume   Culture   Final    NO GROWTH 5 DAYS Performed at  [facility], [place] [address] [place], [place] [address]    Report Status 03/10/2021 FINAL  Final     Time coordinating discharge: 44mns  SIGNED:   JKathie Dike MD  [facility] 03/14/2021, 7:42 PM   If 7PM-7AM, please contact night-coverage [email or web address]

## 2021-03-14 NOTE — Progress Notes (Signed)
Progress Note  13 Days Post-Op  Subjective: CC: afebrile and NAEON. Abdominal pain is improved following a BM yesterday. He denies nausea/emesis. Pain is controlled. He denies breathing difficulties.  Objective: Vital signs in last 24 hours: Temp:  [97.3 F (36.3 C)-98.7 F (37.1 C)] 97.6 F (36.4 C) (05/17 1104) Pulse Rate:  [77-83] 78 (05/17 1104) Resp:  [18-20] 18 (05/17 1104) BP: (102-140)/(48-64) 134/55 (05/17 1104) SpO2:  [91 %-96 %] 95 % (05/17 1104) Weight:  [63.1 kg] 63.1 kg (05/17 0341) Last BM Date: 03/11/21 (smear)  Intake/Output from previous day: No intake/output data recorded. Intake/Output this shift: No intake/output data recorded.  PE: General: pleasant, male who is laying in bed in NAD HEENT: head is normocephalic, atraumatic. Mouth is pink and moist Heart: regular, rate, and rhythm. Palpable radial and pedal pulses bilaterally Lungs: CTAB, no wheezes, rhonchi, or rales noted.  Respiratory effort nonlabored on room air Abd: soft, minimally distended and mild expected tenderness near incision. JP with scant bloody output. 2 staples intact in distal incision which were removed by me today. Dressing change performed - wound with fascia closed and beefy red granulomatous tissue without erythema or discharge. JP site without erythema or discharge. Site of prior RLQ JP which was removed during ex lap on 5/4 is open and with discharge actively draining - there is no surrounding erythema MS: all 4 extremities are symmetrical with no cyanosis, clubbing, or edema. Skin: warm and dry with no masses, lesions, or rashes Psych: A&Ox3 with an appropriate affect.   Lab Results:  Recent Labs    03/12/21 0810 03/13/21 0500  WBC 13.1* 10.2  HGB 8.9* 9.0*  HCT 27.2* 27.9*  PLT 254 267   BMET Recent Labs    03/12/21 0810 03/13/21 0749  NA 138 140  K 3.9 3.5  CL 108 110  CO2 24 23  GLUCOSE 95 110*  BUN 42* 45*  CREATININE 1.31* 1.25*  CALCIUM 7.8* 7.8*    PT/INR No results for input(s): LABPROT, INR in the last 72 hours. CMP     Component Value Date/Time   NA 140 03/13/2021 0749   K 3.5 03/13/2021 0749   CL 110 03/13/2021 0749   CO2 23 03/13/2021 0749   GLUCOSE 110 (H) 03/13/2021 0749   BUN 45 (H) 03/13/2021 0749   CREATININE 1.25 (H) 03/13/2021 0749   CALCIUM 7.8 (L) 03/13/2021 0749   PROT 5.5 (L) 03/09/2021 0451   ALBUMIN 2.3 (L) 03/13/2021 0749   AST 31 03/09/2021 0451   ALT 24 03/09/2021 0451   ALKPHOS 85 03/09/2021 0451   BILITOT 0.6 03/09/2021 0451   GFRNONAA 56 (L) 03/13/2021 0749   Lipase     Component Value Date/Time   LIPASE 92 (H) 02/25/2021 0052       Studies/Results: No results found.  Anti-infectives: Anti-infectives (From admission, onward)   Start     Dose/Rate Route Frequency Ordered Stop   03/01/21 1800  piperacillin-tazobactam (ZOSYN) IVPB 3.375 g  Status:  Discontinued        3.375 g 12.5 mL/hr over 240 Minutes Intravenous Every 8 hours 03/01/21 1732 03/11/21 1247   02/26/21 1500  metroNIDAZOLE (FLAGYL) IVPB 500 mg  Status:  Discontinued        500 mg 100 mL/hr over 60 Minutes Intravenous Every 8 hours 02/26/21 1414 03/01/21 1732   02/22/21 2000  cefTRIAXone (ROCEPHIN) 2 g in sodium chloride 0.9 % 100 mL IVPB  Status:  Discontinued  2 g 200 mL/hr over 30 Minutes Intravenous Daily at 10 pm 02/22/21 1339 03/01/21 1716   02/22/21 1600  metroNIDAZOLE (FLAGYL) IVPB 500 mg  Status:  Discontinued        500 mg 100 mL/hr over 60 Minutes Intravenous Every 8 hours 02/22/21 1339 02/26/21 1414       Assessment/Plan  s/p Procedure(s): EXPLORATORY LAPAROTOMY - PRIMARY REPAIR OF DUODENAL PERFORATION WITH GRAHAM PATCH;  INSERTION OF 19 FR. DRAIN (N/A) Advance diet.  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 Radiology recommend nonemergent vascular consultation Elevated troponin- 32 >174 >112, EKG without ischemia  changes,cards cleared for surgery E.coli, Enterobacterales bacteremia -completed abx. Repeat Cx's with NGTD Pleural effusions- remains off O2.OnlasixBID. Pulm toilet  Acute biliary pancreatitis, suspect resolving ascending cholangitis, gangrenous cholecystitis  s/p lap chole with Dr. Dwain Sarna on 5/1 - POD#16 - operative findings of liver cysts and cirrhosis- will need PCP/GI follow up - Path:Acute and chronic cholecystitis with cholelithiasis -No fluid collection identified in the gallbladder fossaon CT 5/10 - LFT wnl 5/12  Duodenal perforation s/p exp lap with primary repair of duodenal perforation with [name] patch - Dr. Freida Busman 5/4 POD #13 - AFVSS,WBC 13 > 12 > 13> 10.2. Trend - UGInegative5/9. Follow up xray with contrast that passed into colon. Having bowel functionbut still having distension and pain. Discussed with MD. Will give trial of CLD  - CT A/P 5/10 w/multiple fluid loculations in the ventral abdomenw/ largest measuring5.1 x 3.7 cm. Reviewed with MD. Fluid collection appear near surgical drain. Did not recommend IR consultation. Will likely need repeat CT scan in next few days to determine if this is developing into a drainable abscess. - BID WTD for midline wound - personally performed again today - PT/OT - recommending home with HH -JP minimal output - remove prior to discharge  TKW:IOXB, ensure. Encourage PO intake  ID: ceftriaxone 4/27>5/3, flagyl 4/26>5/4, zosyn 5/4>>5/14 Foley: Removed5/6, Voiding. VTE: SCDs,Lovenox  From a surgical perspective patient is stable for discharge. With help of interpretor instructed daughter in wound care and dressing changes as well as reviewed discharge instructions and followup  Interview and physical exam performed with the help of in person interpretor    LOS: 20 days    Eric Form, [facility] 03/14/2021, 11:31 AM Please see Amion for pager number during day hours  7:00am-4:30pm

## 2021-03-14 NOTE — Discharge Instructions (Addendum)
CARDIOLOGY NOTES - PLEASE SHOW THIS TO YOUR DOCTOR IN [place]: Your echocardiogram (heart ultrasound) on 02/24/21 showed mild weakness of your heart muscle with EF 45-50%, mild-moderate mitral regurgitation, and mild dilation of ascending aorta. Your heart monitor in the hospital also showed episodes of "SVT." When you return to [place], you should establish with a cardiologist or follow up with your regular doctor to continue to follow this.  CCS      [facility], [place] [phone]  1. CIRUGA ABDOMINAL ABIERTA: INSTRUCCIONES POSTOP 2.  3. Siempre revise la hoja de instrucciones de alta que le entreg el centro donde se realiz la ciruga. 4.  5. SI TIENE FORMULARIOS DE DISCAPACIDAD O LICENCIA FAMILIAR, DEBE TRAERLOS A LA OFICINA PARA SU PROCESAMIENTO. POR FAVOR NO SE LOS D A SU MDICO. 6.  7. Es posible que le den una receta para analgsicos cuando le den de alta. [place] su medicamento para el dolor segn lo recetado, si es necesario. Si no necesita analgsicos narcticos, puede tomar acetaminofeno (Tylenol) o ibuprofeno (Advil), segn sea necesario. 8. Tome los medicamentos recetados habitualmente, a menos que se le indique lo contrario. 9. Si necesita un resurtido de su medicamento para el dolor, comunquese con su farmacia. Se pondrn en contacto con nuestra oficina para solicitar autorizacin. Las recetas no se surtirn despus de las 5 p. m. ni los fines de [place]. 10. Debe seguir una dieta ligera los primeros das despus de llegar a casa, como sopa y [place] saladas, budn, etc. a menos que su mdico le indique lo contrario. Se puede reanudar una dieta alta en fibra y baja en grasas segn se tolere. Asegrese de incluir muchos lquidos diariamente. La mayora de los pacientes experimentarn algo de hinchazn y moretones en el rea del pecho y el cuello. Las bolsas de hielo ayudarn. La hinchazn y los moretones pueden tardar [employer]. 11. La mayora de los  pacientes experimentarn algo de hinchazn y moretones en el rea de la incisin. La bolsa de hielo ayudar. La hinchazn y los moretones pueden tardar [employer]et[employer]. 12. Es comn experimentar algo de estreimiento si se toman analgsicos despus de la [place]. Aumentar la ingesta de lquidos y tomar un ablandador de heces generalmente ayudar o evitar que ocurra este problema. Se debe tomar un laxante suave (leche de magnesia o Miralax) de acuerdo con las instrucciones del paquete si no hay deposiciones despus de 48 horas. 13.  ACTIVIDADES: puede reanudar las actividades diarias regulares (livianas) a partir del da siguiente, como el cuidado personal diario, caminar, subir escaleras, aumentando gradualmente las actividades segn lo tolere. Puede tener relaciones sexuales cuando le resulte cmodo. Abstngase de levantar objetos pesados ??o esforzarse hasta que su mdico lo apruebe. 14. Puede conducir cuando ya no est tomando analgsicos recetados, puede usar cmodamente el cinturn de seguridad y Geophysical data processor su automvil y Contractor los frenos de [place] segura. 15. Debe ver a su mdico en el consultorio para una cita de seguimiento [employer] despus de la [place]. Asegrese de llamar para esta cita dentro de uno o [employer] de llegar a casa para asegurar una hora de cita conveniente.  CUNDO LLAMAR A SU MDICO: Grant Ruts superior a 101,0 incapacidad para orinar Nuseas y/o vmitos Hinchazn extrema o moretones Sangrado continuo de la incisin. Aumento del dolor, enrojecimiento o drenaje de la incisin. Dificultad para tragar o respirar Calambres o espasmos musculares. Entumecimiento u hormigueo en manos o pies o alrededor [employer].  El  personal de la clnica est disponible para responder a sus preguntas durante el horario comercial habitual. No dude en llamar y pedir hablar con una de las enfermeras si tiene alguna inquietud.  Para ms preguntas, por  favor visite [email or web address]    Hmedo a seco CUIDADO DE HERIDAS: - Cambiar el vendaje dos veces al da - Suministros: solucin salina estril, kerlex, tijeras, almohadillas ABD, cinta Retire el vendaje y todo el empaque con cuidado, humedeciendo con solucin salina estril segn sea necesario para evitar que el vendaje o el vendaje interno se adhieran a la herida. 2. Limpie los bordes de la piel alrededor de la herida con agua/gasa, asegurndose de que no queden residuos de [place] o fugas en la piel que puedan causar irritacin o ruptura de la piel. 3. Humedezca y limpie el kerlex con solucin salina estril y tape la herida desde la base de la herida [employer] nivel de la piel, asegurndose de tomar nota de las posibles reas de Press photographer de la herida, tunelizacin y taponamiento adecuados. La herida se puede empaquetar sin apretar. Recorte el kerlex a medida si no se requiere un kerlex completo. 4. Baruch Gouty la herida con Neomia Dear gasa ABD seca y asegrela con [place]. 5. Marcelino Freestone la fecha/hora en el vendaje seco/cinta para rastrear mejor cundo ocurri el ltimo cambio de vendaje. - aplique cualquier protector de piel/polvo si lo recomienda el mdico para proteger la piel/los pliegues de la piel. - cambie el vendaje segn sea necesario si se produce una fuga, la herida se contamina o el paciente solicita [place]. - Puede ducharse todos los [address] con la herida [place] y, despus de la ducha, debe secarse la herida y colocarse un vendaje limpio. - La cinta de grado mdico y los suministros de embalaje se pueden Psychologist, counselling en [place] o Haematologist en [place]. Los suministros restantes se Engineer, maintenance (IT) en su Arboriculturist, Statistician

## 2021-03-15 NOTE — Progress Notes (Signed)
Received message from Baruch Gouty [facility] that [facility] could not accept the patient for [facility]. Multiple agencies called, they could not accept the patient due to staffing. Carl Best PAC updated. Daughter will do the dressing changes that were taught by [facility] prior to discharging home. Unable to contact the pt/ family, no one answers the telephone at this time, will attempt again to notify the family ( with interpreter) of unability to arrange [facility] services. Abelino Derrick MHA,BSN RN Transition of Care Supervisor [phone]

## 2021-03-16 NOTE — Progress Notes (Signed)
03/15/21- 0800- received notification from [facility] that they could not service pt for [facility] needs, Pt is eligible for charity [facility] however [facility] will not accept referral. Notified supervisors Iva Boop. And Kandace Blitz. For further assistance regarding HH needs for this pt.  Referral made to [facility] outpt therapy as per discussion with daughter as backup for therapy needs as at this time there is not a [facility] agency that can accept referral. Daughter taught drsg needs prior to discharge.

## 2021-03-28 ENCOUNTER — Ambulatory Visit (INDEPENDENT_AMBULATORY_CARE_PROVIDER_SITE_OTHER): Payer: Self-pay | Admitting: Vascular Surgery

## 2021-03-28 ENCOUNTER — Other Ambulatory Visit: Payer: Self-pay

## 2021-03-28 ENCOUNTER — Encounter: Payer: Self-pay | Admitting: Vascular Surgery

## 2021-03-28 VITALS — BP 125/70 | HR 86 | Temp 98.1°F | Resp 20 | Ht 67.0 in | Wt 138.0 lb

## 2021-03-28 DIAGNOSIS — I714 Abdominal aortic aneurysm, without rupture, unspecified: Secondary | ICD-10-CM

## 2021-03-28 DIAGNOSIS — I723 Aneurysm of iliac artery: Secondary | ICD-10-CM

## 2021-03-28 NOTE — Progress Notes (Signed)
[facility] [place]  ASSESSMENT / PLAN: Travis Gallagher is a 87 y.o. male with a infrarenal abdominal aortic aneurysm measuring 75mm and right common iliac artery aneurysm measuring 93mm.   A statement from the Joint Council of the American Association for Vascular Surgery and Society for Vascular Surgery estimated the annual rupture risk according to AAA diameter to be <1%.  The patient is not yet a candidate for elective repair of the aneurysm to prevent rupture.  Recommend the following to reduce the risk of major adverse cardiac / limb events.  Complete cessation from all tobacco products. Excellent blood glucose control with goal A1c < 7%. Blood pressure control with goal blood pressure < 140/90 mmHg. Excellent lipid reduction therapy with goal LDL-C <100 mg/dL. Aspirin 81mg  PO QD.  Atorvastatin 40-80mg  PO QD (or other "high intensity" statin therapy).  Follow up with me in 1 year with AAA duplex for surveillance.   CHIEF COMPLAINT: recent abdominal surgery, incidental AAA / RCIAA on CT  HISTORY OF PRESENT ILLNESS: Travis Gallagher is a 87 y.o. male who presents to clinic for evaluation of incidentally discovered abdominal and right common iliac artery aneurysms.  The patient is visiting from [place]  His son is in the room with him and assisting with translation.  The patient had a recent, difficult hospital stay for biliary pancreatitis. He underwent laparoscopic cholecystectomy 02/26/2021.  He then had a duodenal perforation and required exploratory laparotomy and Graham patch repair 03/01/2021.  He had a prolonged hospitalization and continues to recover slowly.  He is not quite back at his baseline yet.  CT scan from his hospitalization demonstrated an incidental abdominal aortic aneurysm and right common iliac artery aneurysm.  We discussed the natural history of aneurysm disease at length with his son acting as an interpreter.  Past  Medical History:  Diagnosis Date  . AAA (abdominal aortic aneurysm) (HCC)   . GERD (gastroesophageal reflux disease)   . Hypertension     Past Surgical History:  Procedure Laterality Date  . CHOLECYSTECTOMY N/A 02/26/2021   Procedure: LAPAROSCOPIC CHOLECYSTECTOMY;  Surgeon: 04/28/2021, MD;  Location: [facility] OR;  Service: General;  Laterality: N/A;  90 MINUTES ROOM 5  . LAPAROTOMY N/A 03/01/2021   Procedure: EXPLORATORY LAPAROTOMY - PRIMARY REPAIR OF DUODENAL PERFORATION WITH GRAHAM PATCH;  INSERTION OF 19 FR. DRAIN;  Surgeon: 05/01/2021, MD;  Location: [facility] OR;  Service: General;  Laterality: N/A;    History reviewed. No pertinent family history.  Social History   Socioeconomic History  . Marital status: Married    Spouse name: Not on file  . Number of children: Not on file  . Years of education: Not on file  . Highest education level: Not on file  Occupational History  . Not on file  Tobacco Use  . Smoking status: Never Smoker  . Smokeless tobacco: Never Used  Vaping Use  . Vaping Use: Never used  Substance and Sexual Activity  . Alcohol use: Not Currently  . Drug use: Not Currently  . Sexual activity: Yes  Other Topics Concern  . Not on file  Social History Narrative  . Not on file   Social Determinants of Health   Financial Resource Strain: Not on file  Food Insecurity: Not on file  Transportation Needs: Not on file  Physical Activity: Not on file  Stress: Not on file  Social Connections: Not on file  Intimate Partner Violence: Not on file    No  Known Allergies  Current Outpatient Medications  Medication Sig Dispense Refill  . carvedilol (COREG) 3.125 MG tablet Take 1 tablet (3.125 mg total) by mouth 2 (two) times daily with a meal. 60 tablet 1  . famotidine (PEPCID) 20 MG tablet Take 1 tablet (20 mg total) by mouth daily. 30 tablet 1  . feeding supplement (ENSURE ENLIVE / ENSURE PLUS) LIQD Take 237 mLs by mouth 3 (three) times daily between meals. 237  mL 12  . pantoprazole (PROTONIX) 40 MG tablet Take 1 tablet (40 mg total) by mouth 2 (two) times daily. 60 tablet 1  . traMADol (ULTRAM) 50 MG tablet Take 1 tablet (50 mg total) by mouth every 6 (six) hours as needed. 20 tablet 0   No current facility-administered medications for this visit.    REVIEW OF SYSTEMS:  [X]  denotes positive finding, [ ]  denotes negative finding Cardiac  Comments:  Chest pain or chest pressure:    Shortness of breath upon exertion:    Short of breath when lying flat:    Irregular heart rhythm:        Vascular    Pain in calf, thigh, or hip brought on by ambulation:    Pain in feet at night that wakes you up from your sleep:     Blood clot in your veins:    Leg swelling:         Pulmonary    Oxygen at home:    Productive cough:     Wheezing:         Neurologic    Sudden weakness in arms or legs:     Sudden numbness in arms or legs:     Sudden onset of difficulty speaking or slurred speech:    Temporary loss of vision in one eye:     Problems with dizziness:         Gastrointestinal    Blood in stool:     Vomited blood:         Genitourinary    Burning when urinating:     Blood in urine:        Psychiatric    Major depression:         Hematologic    Bleeding problems:    Problems with blood clotting too easily:        Skin    Rashes or ulcers:        Constitutional    Fever or chills:      PHYSICAL EXAM  Vitals:   03/28/21 1358  BP: 125/70  Pulse: 86  Resp: 20  Temp: 98.1 F (36.7 C)  SpO2: 97%  Weight: 138 lb (62.6 kg)  Height: 5\' 7"  (1.702 m)    Constitutional: elderly, chronically ill appearing. no distress. Appears under nourished.  Neurologic: CN intact. no focal findings. no sensory loss. Psychiatric: Mood and affect symmetric and appropriate. Eyes: No icterus. No conjunctival pallor. Ears, nose, throat: mucous membranes moist. Midline trachea.  Cardiac: regular rate and rhythm.  Respiratory:  unlabored. Abdominal: soft, non-tender, non-distended.  Peripheral vascular:  Well healing midline abdominal wound. PDS knot visible in base of wound. Extremity: No edema. No cyanosis. No pallor.  Skin: No gangrene. No ulceration.  Lymphatic: No Stemmer's sign. No palpable lymphadenopathy.  PERTINENT LABORATORY AND RADIOLOGIC DATA  Most recent CBC CBC Latest Ref Rng & Units 03/13/2021 03/12/2021 03/11/2021  WBC 4.0 - 10.5 K/uL 10.2 13.1(H) 12.1(H)  Hemoglobin 13.0 - 17.0 g/dL 9.0(L) 8.9(L) 8.6(L)  Hematocrit 39.0 -  52.0 % 27.9(L) 27.2(L) 26.7(L)  Platelets 150 - 400 K/uL 267 254 234     Most recent CMP CMP Latest Ref Rng & Units 03/13/2021 03/12/2021 03/11/2021  Glucose 70 - 99 mg/dL 250(N) 95 397(Q)  BUN 8 - 23 mg/dL 73(A) 19(F) 79(K)  Creatinine 0.61 - 1.24 mg/dL 2.40(X) 7.35(H) 2.99  Sodium 135 - 145 mmol/L 140 138 139  Potassium 3.5 - 5.1 mmol/L 3.5 3.9 4.3  Chloride 98 - 111 mmol/L 110 108 108  CO2 22 - 32 mmol/L 23 24 25   Calcium 8.9 - 10.3 mg/dL 7.8(L) 7.8(L) 7.9(L)  Total Protein 6.5 - 8.1 g/dL - - -  Total Bilirubin 0.3 - 1.2 mg/dL - - -  Alkaline Phos 38 - 126 U/L - - -  AST 15 - 41 U/L - - -  ALT 0 - 44 U/L - - -    Renal function Estimated Creatinine Clearance: 36.9 mL/min (A) (by C-G formula based on SCr of 1.25 mg/dL (H)).  No results found for: HGBA1C  No results found for: LDLCALC, LDLC, HIRISKLDL, POCLDL, LDLDIRECT, REALLDLC, TOTLDLC   CT scan personally reviewed. Small abdominal aortic aneurysm (approximately 3 cm) Saccular right common iliac artery aneurysm measuring about 3.3 cm.  . Rande Brunt, MD [facility] Phone Number: [phone] 03/28/2021 2:07 PM

## 2021-03-31 ENCOUNTER — Other Ambulatory Visit: Payer: Self-pay

## 2021-03-31 ENCOUNTER — Encounter: Payer: Self-pay | Admitting: Nurse Practitioner

## 2021-03-31 ENCOUNTER — Ambulatory Visit (INDEPENDENT_AMBULATORY_CARE_PROVIDER_SITE_OTHER): Payer: Self-pay | Admitting: Nurse Practitioner

## 2021-03-31 VITALS — BP 118/54 | HR 84 | Ht 65.0 in | Wt 119.1 lb

## 2021-03-31 DIAGNOSIS — I951 Orthostatic hypotension: Secondary | ICD-10-CM

## 2021-03-31 DIAGNOSIS — R42 Dizziness and giddiness: Secondary | ICD-10-CM

## 2021-03-31 DIAGNOSIS — I429 Cardiomyopathy, unspecified: Secondary | ICD-10-CM

## 2021-03-31 DIAGNOSIS — I471 Supraventricular tachycardia: Secondary | ICD-10-CM

## 2021-03-31 MED ORDER — CARVEDILOL 3.125 MG PO TABS: 1.5000 mg | ORAL_TABLET | Freq: Two times a day (BID) | ORAL | 0 refills | Status: DC

## 2021-03-31 NOTE — Progress Notes (Signed)
Office Visit    Patient Name: Travis Gallagher Date of Encounter: 03/31/2021  Primary Care Provider:  Pcp, No Primary Cardiologist:  Prev seen in hospital  Chief Complaint    87 year old male with a history of hypertension and GERD, who recently had a prolonged hospitalization in the setting of pancreatitis and choledocholithiasis with sepsis and bacteremia, with findings of mild cardiomyopathy (EF 45 to 50%), PSVT, intermittent right bundle branch block, abdominal aortic aneurysm, and ascending aortic dilation, who presents for post hospital f/u.  Past Medical History    Past Medical History:  Diagnosis Date  . AAA (abdominal aortic aneurysm) (HCC)    a. 02/2021 CT Abd: 3.3 x 2.7cm infrarenal AAA.  . Bacteremia 02/2021  . Cardiomyopathy (HCC)    a. 01/2021 Echo: Ef 45-50%, diff HK - worse @ septum and inf basal walls. Gr1 DD. Nl RV size/fxn. Mild to mod MR. Mild to mod Ao sclerosis. Mild AI. Asc Ao 42mm.  . Choledocholithiasis    a. 02/2021 s/p chole. Post-op course complicatd by duodenal perforation and biliary leak req repeat lap.  . Coronary artery calcification seen on CT scan   . Dilated aortic root (HCC)    a. 02/2021 Echo: Asc Ao 61mm.  Marland Kitchen GERD (gastroesophageal reflux disease)   . Hypertension   . Intermittent RBBB   . Orthostatic hypotension   . Pancreatitis   . PSVT (paroxysmal supraventricular tachycardia) (HCC)    a. noted during 02/2021 hospitalization for pancreatitis-->low dose bb added.  . Sepsis (HCC) 02/2021   Past Surgical History:  Procedure Laterality Date  . CHOLECYSTECTOMY N/A 02/26/2021   Procedure: LAPAROSCOPIC CHOLECYSTECTOMY;  Surgeon: Emelia Loron, MD;  Location: [facility] OR;  Service: General;  Laterality: N/A;  90 MINUTES ROOM 5  . LAPAROTOMY N/A 03/01/2021   Procedure: EXPLORATORY LAPAROTOMY - PRIMARY REPAIR OF DUODENAL PERFORATION WITH GRAHAM PATCH;  INSERTION OF 19 FR. DRAIN;  Surgeon: Fritzi Mandes, MD;  Location: [facility] OR;  Service:  General;  Laterality: N/A;    Allergies  No Known Allergies  History of Present Illness    87 y/o ? w/ a h/o HTN and GERD.  Patient is from [place] and is currently staying with family in the [place] area.  He presented to [facility] in late April with abdominal pain and was found to have acute pancreatitis in the setting of potential choledocholithiasis.  He was transferred to [facility] for further management and ERCP.  It was subsequently determined that he would require a laparoscopic cholecystectomy.  In the setting of mild troponin elevation to a peak of 174, he was seen by our cardiology team.  An echocardiogram was carried out and showed mild LV dysfunction with an EF of 45 to 50% diffuse hypokinesis, worse at the septum and inferior base.  Grade 1 diastolic dysfunction was also noted as well as mild to moderate MR, mild AI, and mild to moderate aortic sclerosis.  The ascending aorta was dilated at 40 mm.  CT of the abdomen also incidentally showed a 3.3 x 2.7 cm infrarenal abdominal aortic aneurysm.  Patient was cleared for surgery and underwent cholecystectomy however, postoperative course was complicated by duodenal perforation with biliary leak, sepsis, bacteremia requiring repeat laparoscopic surgery.  During hospitalization, he was noted to have brief runs of PSVT as well as intermittent right bundle branch block.  This was managed with the addition of beta-blocker therapy.  He had no additional cardiac issues.  He was finally discharged on May  17.  Spanish interpreter is present today.  Since being home, he has noted slow but steady improvement in his strength and appetite.  He has been having intermittent back pain that is worse with position changes.  His abdominal wound is healing slowly and he is follow-up with surgery in the coming weeks.  He has not been having any significant abdominal pain.  He denies chest pain or dyspnea.  He has been experiencing orthostatic  lightheadedness on carvedilol 3.125 mg twice daily.  He denies palpitations, PND, orthopnea, syncope, edema, or early satiety.  Home Medications    Prior to Admission medications   Medication Sig Start Date End Date Taking? Authorizing Provider  carvedilol (COREG) 3.125 MG tablet Take 1 tablet (3.125 mg total) by mouth 2 (two) times daily with a meal. 03/14/21   Erick Blinks, MD  famotidine (PEPCID) 20 MG tablet Take 1 tablet (20 mg total) by mouth daily. 02/20/21 02/20/22  Phineas Semen, MD  feeding supplement (ENSURE ENLIVE / ENSURE PLUS) LIQD Take 237 mLs by mouth 3 (three) times daily between meals. 03/14/21   Erick Blinks, MD  pantoprazole (PROTONIX) 40 MG tablet Take 1 tablet (40 mg total) by mouth 2 (two) times daily. 03/14/21   Erick Blinks, MD  traMADol (ULTRAM) 50 MG tablet Take 1 tablet (50 mg total) by mouth every 6 (six) hours as needed. 03/14/21 03/14/22  Erick Blinks, MD    Review of Systems    Biggest complaint today is orthostatic lightheadedness as outlined above.  He has also been having some back pain.  He denies chest pain, dyspnea, palpitations, PND, orthopnea, syncope, edema, or early satiety.  All other systems reviewed and are otherwise negative except as noted above.  Physical Exam    VS:  BP (!) 118/54 (BP Location: Left Arm, Patient Position: Sitting, Cuff Size: Normal)   Pulse 84   Ht 5\' 5"  (1.651 m)   Wt 119 lb 2 oz (54 kg)   SpO2 96%   BMI 19.82 kg/m  , BMI Body mass index is 19.82 kg/m.  Orthostatic VS for the past 24 hrs:  BP- Lying Pulse- Lying BP- Sitting Pulse- Sitting BP- Standing at 0 minutes Pulse- Standing at 0 minutes  03/31/21 1110 121/69 80 104/56 82 90/53 91   GEN: Thin, frail in no acute distress. HEENT: normal. Neck: Supple, no JVD, carotid bruits, or masses. Cardiac: RRR, no murmurs, rubs, or gallops. No clubbing, cyanosis, edema.  Radials/PT 2+ and equal bilaterally.  Respiratory:  Respirations regular and unlabored, clear to  auscultation bilaterally. GI: Soft, nontender, nondistended, BS + x 4.  Dressing to the midline abdomen without drainage. MS: no deformity or atrophy. Skin: warm and dry, no rash. Neuro:  Strength and sensation are intact. Psych: Normal affect.  Accessory Clinical Findings    ECG personally reviewed by me today -regular sinus rhythm, 84, ?  Prior septal infarct - no acute changes.  Lab Results  Component Value Date   WBC 10.2 03/13/2021   HGB 9.0 (L) 03/13/2021   HCT 27.9 (L) 03/13/2021   MCV 90.9 03/13/2021   PLT 267 03/13/2021   Lab Results  Component Value Date   CREATININE 1.25 (H) 03/13/2021   BUN 45 (H) 03/13/2021   NA 140 03/13/2021   K 3.5 03/13/2021   CL 110 03/13/2021   CO2 23 03/13/2021   Lab Results  Component Value Date   ALT 24 03/09/2021   AST 31 03/09/2021   GGT 240 (H) 02/22/2021  ALKPHOS 85 03/09/2021   BILITOT 0.6 03/09/2021   Lab Results  Component Value Date   TRIG 111 03/06/2021     Assessment & Plan    1.  Cardiomyopathy: Patient recently admitted with pancreatitis and choledocholithiasis requiring laparoscopic cholecystectomy.  He was noted to have mild troponin elevation on admission and echocardiogram showed mild LV dysfunction with an EF of 45 to 50% diffuse hypokinesis, worse at the septum and inferior base.  Grade 1 diastolic dysfunction was also noted as well as mild to moderate MR, mild AI, and mild to moderate aortic sclerosis.  He was placed on low-dose beta-blocker therapy with additional therapy being limited by low blood pressures.  In the absence of chest pain, it was felt that LV dysfunction was most likely secondary to stress-induced cardiomyopathy.  Patient denies any prior history of chest pain or dyspnea.  He is euvolemic on examination today.  I am actually having to reduce his carvedilol dose given orthostatic hypotension.  He is still recovering following his surgery.  At some point, if he stays in the night states, we can  consider stress testing, otherwise would defer any additional ischemic evaluation to his doctors in [place].  2.  Orthostatic hypotension: Patient with orthostatic lightheadedness at home and orthostatic by vital signs today.  I am reducing his carvedilol to 3.125 mg, half a tablet twice daily.  His family will continue to monitor for symptoms and will contact us if symptoms persist.  If so, we will likely have to discontinue beta-blocker therapy.  I am going to follow-up a CBC and basic metabolic panel today as I do suspect that poor nutrition may be playing a role as well.  3.  PSVT: Quiescent since hospitalization.  Reducing beta-blocker dose as outlined above.  4.  Essential hypertension: As above, orthostatic/hypotensive.  5.  Status post cholecystectomy: Recent admission with pancreatitis and choledocholithiasis requiring cholecystectomy and complicated by sepsis/bacteremia with duodenal perforation and biliary leak requiring repeat surgery.  Patient notes ongoing healing of his abdominal wound.  There is no drainage on the dressing today.  He has surgical follow-up in the coming weeks.  6.  AAA/Dilated Asc Ao:  Noted on imaging.  Will require annual follow-up, which will likely be carried out in [place] in the future.  7.  Disposition: Follow-up CBC and basic metabolic panel today.  Patient will follow-up in 4 to 6 weeks.   Nicolasa Ducking, NP 03/31/2021, 12:39 PM

## 2021-03-31 NOTE — Patient Instructions (Addendum)
Medication Instructions:  Your physician has recommended you make the following change in your medication:   1. DECREASE Carvedilol 3.125 and take one half tablet twice a day  *If you need a refill on your cardiac medications before your next appointment, please call your pharmacy*   Lab Work: BMET and CBC today  If you have labs (blood work) drawn today and your tests are completely normal, you will receive your results only by: Marland Kitchen MyChart Message (if you have MyChart) OR . A paper copy in the mail If you have any lab test that is abnormal or we need to change your treatment, we will call you to review the results.   Testing/Procedures: None   Follow-Up: At [facility], you and your health needs are our priority.  As part of our continuing mission to provide you with exceptional heart care, we have created designated Provider Care Teams.  These Care Teams include your primary Cardiologist (physician) and Advanced Practice Providers (APPs -  Physician Assistants and Nurse Practitioners) who all work together to provide you with the care you need, when you need it.  We recommend signing up for the patient portal called "MyChart".  Sign up information is provided on this After Visit Summary.  MyChart is used to connect with patients for Virtual Visits (Telemedicine).  Patients are able to view lab/test results, encounter notes, upcoming appointments, etc.  Non-urgent messages can be sent to your provider as well.   To learn more about what you can do with MyChart, go to [email or web address].    Your next appointment:   4-6  week(s)  The format for your next appointment:   In Person  Provider:   Nicolasa Ducking, NP

## 2021-04-01 LAB — BASIC METABOLIC PANEL
BUN/Creatinine Ratio: 15 (ref 10–24)
BUN: 17 mg/dL (ref 8–27)
CO2: 22 mmol/L (ref 20–29)
Calcium: 8.9 mg/dL (ref 8.6–10.2)
Chloride: 105 mmol/L (ref 96–106)
Creatinine, Ser: 1.1 mg/dL (ref 0.76–1.27)
Glucose: 110 mg/dL — ABNORMAL HIGH (ref 65–99)
Potassium: 5.1 mmol/L (ref 3.5–5.2)
Sodium: 140 mmol/L (ref 134–144)
eGFR: 65 mL/min/{1.73_m2} (ref >59)

## 2021-04-01 LAB — CBC
Hematocrit: 32.5 % — ABNORMAL LOW (ref 37.5–51.0)
Hemoglobin: 10.6 g/dL — ABNORMAL LOW (ref 13.0–17.7)
MCH: 28.1 pg (ref 26.6–33.0)
MCHC: 32.6 g/dL (ref 31.5–35.7)
MCV: 86 fL (ref 79–97)
Platelets: 149 10*3/uL — ABNORMAL LOW (ref 150–450)
RBC: 3.77 x10E6/uL — ABNORMAL LOW (ref 4.14–5.80)
RDW: 12.9 % (ref 11.6–15.4)
WBC: 8.3 10*3/uL (ref 3.4–10.8)

## 2021-04-05 ENCOUNTER — Encounter: Payer: Self-pay | Admitting: Gastroenterology

## 2021-04-05 ENCOUNTER — Ambulatory Visit: Payer: Self-pay | Admitting: Gastroenterology

## 2021-04-05 ENCOUNTER — Other Ambulatory Visit (INDEPENDENT_AMBULATORY_CARE_PROVIDER_SITE_OTHER): Payer: Self-pay

## 2021-04-05 VITALS — BP 106/68 | HR 57 | Ht 66.25 in | Wt 119.0 lb

## 2021-04-05 DIAGNOSIS — K7689 Other specified diseases of liver: Secondary | ICD-10-CM

## 2021-04-05 LAB — PROTIME-INR
INR: 1.2 ratio — ABNORMAL HIGH (ref 0.8–1.0)
Prothrombin Time: 13.7 s — ABNORMAL HIGH (ref 9.6–13.1)

## 2021-04-05 NOTE — Progress Notes (Signed)
Agree with expectant management in this 87 year old without any evidence of hepatic decompensation

## 2021-04-05 NOTE — Patient Instructions (Signed)
If you are age 85 or older, your body mass index should be between 23-30. Your Body mass index is 19.06 kg/m. If this is out of the aforementioned range listed, please consider follow up with your Primary Care Provider.  If you are age 53 or younger, your body mass index should be between 19-25. Your Body mass index is 19.06 kg/m. If this is out of the aformentioned range listed, please consider follow up with your Primary Care Provider.   Your provider has requested that you go to the basement level for lab work before leaving today. Press "B" on the elevator. The lab is located at the first door on the left as you exit the elevator.  Due to recent changes in healthcare laws, you may see the results of your imaging and laboratory studies on MyChart before your provider has had a chance to review them.  We understand that in some cases there may be results that are confusing or concerning to you. Not all laboratory results come back in the same time frame and the provider may be waiting for multiple results in order to interpret others.  Please give [place] 48 hours in order for your provider to thoroughly review all the results before contacting the office for clarification of your results.   The [facility] providers would like to encourage you to use [facility] to communicate with providers for non-urgent requests or questions.  Due to long hold times on the telephone, sending your provider a message by [facility] may be a faster and more efficient way to get a response.  Please allow 48 business hours for a response.  Please remember that this is for non-urgent requests.   It was a pleasure to see you today!  Thank you for trusting me with your gastrointestinal care!     Doug Sou, PA-C

## 2021-04-05 NOTE — Progress Notes (Signed)
04/05/2021 Travis Gallagher [id number] Dec 17, 1930   HISTORY OF PRESENT ILLNESS: This is an 87 year old male who is here today for evaluation regarding finding of cirrhosis during recent surgery.  He is actually a resident of [place] and is just visiting.  Unfortunately during his stay here he ended up requiring cholecystectomy after having gallstone pancreatitis and a Graham patch for duodenum perforation.  Our team (Dr. Marina Goodell) actually saw him in consult during his hospital stay to help rule out choledocholithiasis.  The patient is here today with an interpreter as he is primarily Spanish-speaking, his son, and I believe his daughter-in-law.  They tell me that he has never been told he has cirrhosis in the past.  He has had multiple imaging studies in the form of CT scans and also MRI over the past month that have shown multiple liver cysts, but no findings to suggest cirrhosis or portal hypertension.  The family is aware of the liver cysts from the past.  His platelets are normal.  His LFTs have been normal except for around the time of his acute gallbladder issue, but have completely normalized since then.  He has no LE edema or confusion.  He denies abdominal pain, nausea, etc.  Denies denies ETOH use.  The entire visit was performed via in-person interpreter.  Past Medical History:  Diagnosis Date  . AAA (abdominal aortic aneurysm) (HCC)    a. 02/2021 CT Abd: 3.3 x 2.7cm infrarenal AAA.  . Bacteremia 02/2021  . Cardiomyopathy (HCC)    a. 01/2021 Echo: Ef 45-50%, diff HK - worse @ septum and inf basal walls. Gr1 DD. Nl RV size/fxn. Mild to mod MR. Mild to mod Ao sclerosis. Mild AI. Asc Ao 22mm.  . Choledocholithiasis    a. 02/2021 s/p chole. Post-op course complicatd by duodenal perforation and biliary leak req repeat lap.  . Coronary artery calcification seen on CT scan   . Dilated aortic root (HCC)    a. 02/2021 Echo: Asc Ao 20mm.  Marland Kitchen GERD (gastroesophageal reflux disease)   .  Hypertension   . Intermittent RBBB   . Orthostatic hypotension   . Pancreatitis   . PSVT (paroxysmal supraventricular tachycardia) (HCC)    a. noted during 02/2021 hospitalization for pancreatitis-->low dose bb added.  . Sepsis (HCC) 02/2021   Past Surgical History:  Procedure Laterality Date  . CHOLECYSTECTOMY N/A 02/26/2021   Procedure: LAPAROSCOPIC CHOLECYSTECTOMY;  Surgeon: Emelia Loron, MD;  Location: [facility] OR;  Service: General;  Laterality: N/A;  90 MINUTES ROOM 5  . LAPAROTOMY N/A 03/01/2021   Procedure: EXPLORATORY LAPAROTOMY - PRIMARY REPAIR OF DUODENAL PERFORATION WITH GRAHAM PATCH;  INSERTION OF 19 FR. DRAIN;  Surgeon: Fritzi Mandes, MD;  Location: [facility] OR;  Service: General;  Laterality: N/A;    reports that he has never smoked. He has never used smokeless tobacco. He reports previous alcohol use. He reports previous drug use. family history includes Diabetes in his brother and sister. No Known Allergies    Outpatient Encounter Medications as of 04/05/2021  Medication Sig  . carvedilol (COREG) 3.125 MG tablet Take 0.5 tablets (1.5625 mg total) by mouth 2 (two) times daily with a meal.  . feeding supplement (ENSURE ENLIVE / ENSURE PLUS) LIQD Take 237 mLs by mouth 3 (three) times daily between meals.  . pantoprazole (PROTONIX) 40 MG tablet Take 80 mg by mouth daily.   No facility-administered encounter medications on file as of 04/05/2021.     REVIEW OF SYSTEMS  :  All other systems reviewed and negative except where noted in the History of Present Illness.   PHYSICAL EXAM: BP 106/68   Pulse (!) 57   Ht 5' 6.25" (1.683 m)   Wt 119 lb (54 kg)   BMI 19.06 kg/m  General: Well developed Hispanic male in no acute distress Head: Normocephalic and atraumatic Eyes:  sclerae anicteric,conjunctive pink. Ears: Normal auditory acuity Lungs: Clear throughout to auscultation; no W/R/R. Heart: Slightly tachy but regular rhythm. Abdomen: Soft, non-distended.  BS present.  Dressing  clean and intact on mid-abdomen. Musculoskeletal: Symmetrical with no gross deformities  Skin: No lesions on visible extremities Extremities: No edema  Neurological: Alert oriented x 4, grossly non-focal Psychological:  Alert and cooperative. Normal mood and affect  ASSESSMENT AND PLAN: *87 year old male with multiple liver cysts seen on imaging: Surgical team suggested GI follow-up in regards to finding of cirrhosis at the time of surgery.  He has had multiple imaging studies that have all shown multiple liver cysts, but none commenting or suggesting cirrhosis or signs of portal hypertension..  His platelets have been normal.  LFTs been normal except for at the time of his acute gallbladder issue.  INR has not been checked.  We can do that.  I am not 100% convinced that he has cirrhosis, but certainly if he does he is very well compensated at this point without any complication suggestive of such.  Not sure how reliable an ultrasound with elastography would be setting of having multiple liver cysts.  Nonetheless otherwise I think we can observe for now. *Recent cholecystectomy after having gallstone pancreatitis and Graham patch for duodenal perforation:  Has follow-up with Dr. Dwain Sarna tomorrow.   CC:  No ref. provider found

## 2021-04-06 ENCOUNTER — Telehealth: Payer: Self-pay | Admitting: General Surgery

## 2021-04-10 ENCOUNTER — Telehealth: Payer: Self-pay | Admitting: Nurse Practitioner

## 2021-04-10 NOTE — Telephone Encounter (Signed)
Patient family member calling  States that he has been taking the carvedilol as prescribed but is still having dizziness Wanted to move appointment sooner - first available was Leafy Kindle on 06/29 Please call to discuss

## 2021-04-11 NOTE — Telephone Encounter (Signed)
He was quite orthostatic and I suspect that-that is still contributing to his dizziness.  Please have him stop carvedilol.  Encourage hydration.

## 2021-04-11 NOTE — Telephone Encounter (Signed)
Patients wife called in then gave phone to daughter in law per release form. She states patient is having some dizziness and they wanted to know if he could take Vertizin. Advised that should be fine and they can also check with his pharmacist. Confirmed upcoming appointment and they verbalized understanding with no further questions.

## 2021-04-13 NOTE — Telephone Encounter (Signed)
Spoke with patients daughter per release form. Advised that provider would like him to stop the carvedilol and to increase fluid intake. Confirmed upcoming appointment. She verbalized understanding with no further questions at this time.

## 2021-04-26 ENCOUNTER — Ambulatory Visit (INDEPENDENT_AMBULATORY_CARE_PROVIDER_SITE_OTHER): Payer: Self-pay | Admitting: Physician Assistant

## 2021-04-26 ENCOUNTER — Encounter: Payer: Self-pay | Admitting: Physician Assistant

## 2021-04-26 ENCOUNTER — Other Ambulatory Visit: Payer: Self-pay

## 2021-04-26 VITALS — BP 130/70 | HR 100 | Ht 66.25 in | Wt 119.0 lb

## 2021-04-26 DIAGNOSIS — I77810 Thoracic aortic ectasia: Secondary | ICD-10-CM

## 2021-04-26 DIAGNOSIS — R079 Chest pain, unspecified: Secondary | ICD-10-CM

## 2021-04-26 DIAGNOSIS — I714 Abdominal aortic aneurysm, without rupture, unspecified: Secondary | ICD-10-CM

## 2021-04-26 DIAGNOSIS — Z9049 Acquired absence of other specified parts of digestive tract: Secondary | ICD-10-CM

## 2021-04-26 DIAGNOSIS — D631 Anemia in chronic kidney disease: Secondary | ICD-10-CM

## 2021-04-26 DIAGNOSIS — Z8679 Personal history of other diseases of the circulatory system: Secondary | ICD-10-CM

## 2021-04-26 DIAGNOSIS — I5042 Chronic combined systolic (congestive) and diastolic (congestive) heart failure: Secondary | ICD-10-CM

## 2021-04-26 DIAGNOSIS — R0602 Shortness of breath: Secondary | ICD-10-CM

## 2021-04-26 DIAGNOSIS — R000 Tachycardia, unspecified: Secondary | ICD-10-CM

## 2021-04-26 DIAGNOSIS — I10 Essential (primary) hypertension: Secondary | ICD-10-CM

## 2021-04-26 DIAGNOSIS — I080 Rheumatic disorders of both mitral and aortic valves: Secondary | ICD-10-CM

## 2021-04-26 DIAGNOSIS — I429 Cardiomyopathy, unspecified: Secondary | ICD-10-CM

## 2021-04-26 DIAGNOSIS — N189 Chronic kidney disease, unspecified: Secondary | ICD-10-CM

## 2021-04-26 DIAGNOSIS — I471 Supraventricular tachycardia: Secondary | ICD-10-CM

## 2021-04-26 MED ORDER — CARVEDILOL 3.125 MG PO TABS: 3.1250 mg | ORAL_TABLET | Freq: Two times a day (BID) | ORAL | 1 refills | Status: DC

## 2021-04-26 NOTE — Patient Instructions (Signed)
Medication Instructions:  Your physician has recommended you make the following change in your medication:  START Carvedilol (coreg) 3.125 mg twice a day. An Rx has been sent to pharmacy.  *If you need a refill on your cardiac medications before your next appointment, please call your pharmacy*   Lab Work: None ordered If you have labs (blood work) drawn today and your tests are completely normal, you will receive your results only by: MyChart Message (if you have MyChart) OR A paper copy in the mail If you have any lab test that is abnormal or we need to change your treatment, we will call you to review the results.   Testing/Procedures: Your physician has requested that you have a lexiscan myoview. For further information please visit [email or web address] Please follow instruction sheet, as given.    Follow-Up: At [facility], you and your health needs are our priority.  As part of our continuing mission to provide you with exceptional heart care, we have created designated Provider Care Teams.  These Care Teams include your primary Cardiologist (physician) and Advanced Practice Providers (APPs -  Physician Assistants and Nurse Practitioners) who all work together to provide you with the care you need, when you need it.  We recommend signing up for the patient portal called "MyChart".  Sign up information is provided on this After Visit Summary.  MyChart is used to connect with patients for Virtual Visits (Telemedicine).  Patients are able to view lab/test results, encounter notes, upcoming appointments, etc.  Non-urgent messages can be sent to your provider as well.   To learn more about what you can do with MyChart, go to [email or web address].    Your next appointment:   After testing  The format for your next appointment:   In Person  Provider:   You may see one of the following Advanced Practice Providers on your designated Care Team:   Nicolasa Ducking, NP Eula Listen,  PA-C Marisue Ivan, PA-C Cadence [place], [place] Gillian Shields, NP   Other Instructions  [facility] MYOVIEW  Your caregiver has ordered a Stress Test with nuclear imaging. The purpose of this test is to evaluate the blood supply to your heart muscle. This procedure is referred to as a "Non-Invasive Stress Test." This is because other than having an IV started in your vein, nothing is inserted or "invades" your body. Cardiac stress tests are done to find areas of poor blood flow to the heart by determining the extent of coronary artery disease (CAD). Some patients exercise on a treadmill, which naturally increases the blood flow to your heart, while others who are  unable to walk on a treadmill due to physical limitations have a pharmacologic/chemical stress agent called Lexiscan . This medicine will mimic walking on a treadmill by temporarily increasing your coronary blood flow.   Please note: these test may take anywhere between 2-4 hours to complete  PLEASE REPORT TO [facility] MEDICAL MALL ENTRANCE  THE VOLUNTEERS AT THE FIRST DESK WILL DIRECT YOU WHERE TO GO  Date of Procedure:_____________________________________  Arrival Time for Procedure:______________________________  Instructions regarding medication:    PLEASE NOTIFY THE OFFICE AT LEAST 24 HOURS IN ADVANCE IF YOU ARE UNABLE TO KEEP YOUR APPOINTMENT.  [phone] AND  PLEASE NOTIFY NUCLEAR MEDICINE AT [facility] AT LEAST 24 HOURS IN ADVANCE IF YOU ARE UNABLE TO KEEP YOUR APPOINTMENT. [phone]  How to prepare for your Myoview test:  Do not eat or drink after midnight No caffeine for 24 hours prior to test No  smoking 24 hours prior to test. Your medication may be taken with water.  If your doctor stopped a medication because of this test, do not take that medication. Please wear a short sleeve shirt. No cologne or lotion. Wear comfortable walking shoes. No heels!

## 2021-04-26 NOTE — Progress Notes (Signed)
Office Visit    Patient Name: Travis Gallagher Date of Encounter: 04/29/2021  PCP:  Aviva Kluver   [facility]  Cardiologist:  [place] / Previously rounded on in Hospital Advanced Practice Provider:  No care team member to display Electrophysiologist:  None  Chief Complaint    Chief Complaint  Patient presents with   Follow-up    4 Week follow up. Medications verbally reviewed with patient and interpreter.     87 y.o. male with history of HFrEF (EF 40 to 45%), PSVT, intermittent right bundle branch block, abdominal aortic aneurysm, ascending aortic dilation, hypertension, GERD, 01/2021 prolonged hospitalization at [facility] in the setting of pancreatitis and choledocholithiasis with sepsis and bacteremia, and seen today for follow-up of previous 03/31/2021 visit.  Past Medical History    Past Medical History:  Diagnosis Date   AAA (abdominal aortic aneurysm) (HCC)    a. 02/2021 CT Abd: 3.3 x 2.7cm infrarenal AAA.   Bacteremia 02/2021   Cardiomyopathy (HCC)    a. 01/2021 Echo: Ef 45-50%, diff HK - worse @ septum and inf basal walls. Gr1 DD. Nl RV size/fxn. Mild to mod MR. Mild to mod Ao sclerosis. Mild AI. Asc Ao 66mm.   Choledocholithiasis    a. 02/2021 s/p chole. Post-op course complicatd by duodenal perforation and biliary leak req repeat lap.   Coronary artery calcification seen on CT scan    Dilated aortic root (HCC)    a. 02/2021 Echo: Asc Ao 39mm.   GERD (gastroesophageal reflux disease)    Hypertension    Intermittent RBBB    Orthostatic hypotension    Pancreatitis    PSVT (paroxysmal supraventricular tachycardia) (HCC)    a. noted during 02/2021 hospitalization for pancreatitis-->low dose bb added.   Sepsis (HCC) 02/2021   Past Surgical History:  Procedure Laterality Date   CHOLECYSTECTOMY N/A 02/26/2021   Procedure: LAPAROSCOPIC CHOLECYSTECTOMY;  Surgeon: Emelia Loron, MD;  Location: [facility] OR;  Service: General;  Laterality: N/A;  90 MINUTES  ROOM 5   LAPAROTOMY N/A 03/01/2021   Procedure: EXPLORATORY LAPAROTOMY - PRIMARY REPAIR OF DUODENAL PERFORATION WITH GRAHAM PATCH;  INSERTION OF 19 FR. DRAIN;  Surgeon: Fritzi Mandes, MD;  Location: [facility] OR;  Service: General;  Laterality: N/A;    Allergies  Allergies  Allergen Reactions   Quinolones     Ascending aorta and abdominal aneurysm -avoid use    History of Present Illness    Travis Gallagher is a 87 y.o. male with PMH as above.  He is from [place] and currently staying with family in the [place] area.  Today's clinic visit was performed with the assistance of a translator.  Prior to his 01/2021 admission, he had no previously known history HFpEF heart failure or arrhythmia.  Known history included hypertension and GERD.    He was admitted 01/2021 in the setting of pancreatitis and choledocholithiasis with sepsis bacteremia.  He was transferred to [facility] for further management and ERCP.  It was determined he would require laparoscopic cholecystectomy.  Troponin peaked at 174, and cardiology was consulted.  PSVT and intermittent RBBB was noted with patient started on beta-blocker. Echo showed EF 45 to 50%, diffuse hypokinesis worse at the septum and inferior base, G1 DD, mild to moderate MR, mild AI, mild to moderate aortic sclerosis.  Ascending aorta was dilated at 40 mm.  CT abdomen showed 3.3 x 2.7 cm infrarenal abdominal aortic aneurysm.    Given the absence of chest pain  during his admission, it was felt that his LV dysfunction was most likely 2/2 stress-induced cardiomyopathy.  He last proceeded to surgery and underwent cholecystectomy with postoperative course complicated by duodenal perforation with biliary leak, sepsis, and bacteremia.  He required repeat laparoscopic surgery in that setting.  He was discharged 03/14/2021.    Seen in office 03/31/2021 for hospital follow-up and noted improvement in strength and appetite.  He had intermittent back pain, worse with  position changes.  His abdominal wound was healing slowly with upcoming follow-up scheduled with surgery.  He denied significant abdominal pain.  It was suspected his PSVT was quiescent since discharge.  He had orthostatic lightheadedness on carvedilol 3.125 mg twice daily.  Carvedilol dose reduced to 1/2 tablet twice daily.  Appropriate oral intake encouraged.  Recommendation was to follow-up with his doctors in [place] for additional ischemic evaluation.  Annual follow-up was recommended in [place] for his dilated ascending aorta/AAA.  It was noted that, if he stayed in the [place], future considerations could include stress testing.    Today, 04/26/2021, he returns to clinic and notes that his dizziness has improved since reducing carvedilol 3.125 mg twice daily to 1/2 tablet twice daily.  He continues to feel stronger with each day from discharge.  He notes chest pain that he thinks is related to his back pain.  Chest pain is vague on further questioning.  He mainly notices his chest pain at night, and sometimes it will last longer than 5 minutes or occur when walking for too long.  He reports some dyspnea.  No regular exercise routine, and we did discuss deconditioning as possible.  [place] regular activity.  He is using 3 pillows at night, which she clarifies is for comfort rather than breathing.  His family member, present in the room, wonders about using 3 pillows for comfort, given the patient's report of back and neck pain.  He has had abdominal pain related to his recent surgery as above.  He does feel that this is improving, though slowly.  He reports that he will go to [place] in August 2022.  He first plans to visit his son in [place], at which time he will go to his family doctor.  He wonders about further work-up at this time, given he plans to stay in the states for longer than initially anticipated.  He also requests a note today, so that he can supply it to the  [place] government to explain the reason that he has not yet traveled back to his home country.  He is not monitoring his blood pressure at home but agreeable to do so, as well as his daily weights.  He will continue to monitor his symptoms and log these as well.  He is eating well with diet reviewed as well as recommendations regarding fluids and salt. No signs or symptoms of bleeding.  He reports compliance with his beta-blocker.  Home Medications   Current Outpatient Medications  Medication Instructions   carvedilol (COREG) 3.125 mg, Oral, 2 times daily with meals   feeding supplement (ENSURE ENLIVE / ENSURE PLUS) LIQD 237 mLs, Oral, 3 times daily between meals     Review of Systems    He denies palpitations, pnd, orthopnea, n, v, syncope, edema, weight gain, or early satiety.  With reduced beta-blocker dose, he has had improvement in dizziness.  He reports vague chest pain that he thinks may be related to his back pain but mainly notices  at night, often lasting more than 5 minutes, and sometimes occurring with exertion.  He reports some dyspnea.  He uses 3 pillows for comfort.  He has ongoing back and neck pain.  He reports abdominal pain associated with his recent surgery.  All other systems reviewed and are otherwise negative except as noted above.  Physical Exam    VS:  BP 130/70 (BP Location: Left Arm, Patient Position: Sitting, Cuff Size: Normal)   Pulse 100   Ht 5' 6.25" (1.683 m)   Wt 119 lb (54 kg)   SpO2 95%   BMI 19.06 kg/m  , BMI Body mass index is 19.06 kg/m. GEN: Thin/frail male, in no acute distress.  Joined by his family and a Nurse, learning disabilitytranslator. HEENT: normal. Neck: Supple, no JVD, carotid bruits, or masses. Cardiac: tachycardic but regular, 1/6 systolic murmur.  No rubs, or gallops. No clubbing, cyanosis, edema.  Radials/DP/PT 2+ and equal bilaterally.  Respiratory: Poor inspiratory effort, respirations regular and unlabored, clear to auscultation bilaterally. GI: Palpation  deferred in the setting of his recent surgery and tenderness s/p surgery.  Clean, dry, and intact dressings to the midline abdomen without any evidence of infection noted on visual examination. MS: no deformity or atrophy. Skin: warm and dry, no rash. Neuro:  Strength and sensation are intact. Psych: Normal affect.  Accessory Clinical Findings    ECG personally reviewed by me today -no EKG today.  VITALS Reviewed today   Temp Readings from Last 3 Encounters:  03/28/21 98.1 F (36.7 C)  03/14/21 97.8 F (36.6 C) (Oral)  02/24/21 98.8 F (37.1 C) (Oral)   BP Readings from Last 3 Encounters:  04/26/21 130/70  04/05/21 106/68  03/31/21 (!) 118/54   Pulse Readings from Last 3 Encounters:  04/26/21 100  04/05/21 (!) 57  03/31/21 84    Wt Readings from Last 3 Encounters:  04/26/21 119 lb (54 kg)  04/05/21 119 lb (54 kg)  03/31/21 119 lb 2 oz (54 kg)     LABS  reviewed today   Lab Results  Component Value Date   WBC 8.3 03/31/2021   HGB 10.6 (L) 03/31/2021   HCT 32.5 (L) 03/31/2021   MCV 86 03/31/2021   PLT 149 (L) 03/31/2021   Lab Results  Component Value Date   CREATININE 1.10 03/31/2021   BUN 17 03/31/2021   NA 140 03/31/2021   K 5.1 03/31/2021   CL 105 03/31/2021   CO2 22 03/31/2021   Lab Results  Component Value Date   ALT 24 03/09/2021   AST 31 03/09/2021   GGT 240 (H) 02/22/2021   ALKPHOS 85 03/09/2021   BILITOT 0.6 03/09/2021   Lab Results  Component Value Date   TRIG 111 03/06/2021    No results found for: HGBA1C Lab Results  Component Value Date   TSH 0.039 (L) 02/28/2021     STUDIES/PROCEDURES reviewed today   Echo 02/24/21  1. Diffuse hypokinesis abnromal septal motion worse in inferior basal  wall. Left ventricular ejection fraction, by estimation, is 45 to 50%. The  left ventricle has mildly decreased function. The left ventricle  demonstrates global hypokinesis. There is  moderate left ventricular hypertrophy. Left ventricular  diastolic  parameters are consistent with Grade I diastolic dysfunction (impaired  relaxation).   2. Right ventricular systolic function is normal. The right ventricular  size is normal. There is mildly elevated pulmonary artery systolic  pressure.   3. The pericardial effusion is posterior to the left ventricle.  4. The mitral valve is abnormal. Mild to moderate mitral valve  regurgitation. No evidence of mitral stenosis.   5. The aortic valve is tricuspid. There is moderate calcification of the  aortic valve. There is moderate thickening of the aortic valve. Aortic  valve regurgitation is mild. Mild to moderate aortic valve  sclerosis/calcification is present, without any  evidence of aortic stenosis.   6. Aortic dilatation noted. There is mild dilatation of the ascending  aorta, measuring 40 mm.   7. The inferior vena cava is normal in size with greater than 50%  respiratory variability, suggesting right atrial pressure of 3 mmHg.  Assessment & Plan    Chronic combined systolic and diastolic heart failure (EF 45-50%, 01/2021)  Suspected stress induced CM -- He reports chest pain and some dyspnea today with further details regarding CP character and severity vague.  He attributes the chest pain to his back, mainly noticing it at night or at the end of a long walk. We did discuss DOE possible 2/2 deconditioning.  During his 01/2021 admission for pancreatitis and choledocholithiasis, he was noted to have mild elevation in troponin.  Echo showed G1 DD and EF mildly reduced with EF 45 to 50% and diffuse hypokinesis, worse at the septum and inferior base.  Given his absence of chest pain during admission, it was thought that his LV dysfunction was 2/2 stress-induced cardiomyopathy.  Escalation of GDMT during admission was limited by low BP.  Schedule MPI given the above findings and that he plans to stay in the [place] until August, as well as his report of chest pain/dyspnea though atypical  / inconsistent features as discussed today. Euvolemic on exam with stable wt and will continue to defer a standing diuretic for now and reassess at RTC. He will monitor wt and BP as home, as well as HR and sx.  Continue current beta-blocker. Increase activity as tolerated. Letter provided to [place] for pt.  Shared Decision Making/Informed Consent{ The risks [chest pain, shortness of breath, cardiac arrhythmias, dizziness, blood pressure fluctuations, myocardial infarction, stroke/transient ischemic attack, nausea, vomiting, allergic reaction, radiation exposure, metallic taste sensation and life-threatening complications (estimated to be 1 in 10,000)], benefits (risk stratification, diagnosing coronary artery disease, treatment guidance) and alternatives of a nuclear stress test were discussed in detail with Mr. Stefanos Haynesworth and he agrees to proceed.   H/o orthostatic hypotension --He notes improvement in orthostatic lightheadedness since decreasing his carvedilol to 1/2 tablet of Coreg 3.125 mg twice daily.  Suspect that his previous orthostasis was 2/2 hypovolemia.  Both heart rate and blood pressure elevated today with recommendation to increase back up to 3.125 mg twice daily for further support of both rate and pressure.  Suspect his previous low pressures were 2/2 poor oral intake and have improved since that time with increased intake.  Reviewed recommendations for fluid and salt intake.  In addition, recommended BP/heart rate/weight/symptoms to be logged and brought to his next clinic visit.  Essential hypertension, goal BP 130/80 or lower --Previous history of essential hypertension.  Started on carvedilol during his admission.  Given his elevated BP and heart rate today, we will increase back up to carvedilol 3.125 mg twice daily.  He is agreeable to call the office if BP remains elevated consistently above systolic 130s between visits.  Otherwise, we will reassess his pressures at RTC and  on review of his log of his daily weight, BP, and symptoms as discussed today.  He will work on restricting salt  in his diet with ongoing hydration encouraged Zio under 2 L/day.   PSVT / Sinus Tachycardia --No EKG today with previous clinic visit noting SVT quiescent since hospitalization.  He denies tachypalpitations.  As above, we will plan to increase back to carvedilol 3.125 mg twice daily given sinus tachycardia today.  Aortic and mitral valvular disease - 01/2021 Echo showed mild to moderate MR, mild AI, and mild to moderate aortic sclerosis.  Continue current beta-blocker.  Periodic echo to monitor as indicated.  S/p cholecystectomy --Suspect recent cholecystectomy and complicated admission with sepsis and bacteremia/duodenal perforation/biliary leak requiring repeat surgery is contributing to his low energy, though improving with each day since discharge.  He does report slow healing of his abdominal wound.  As above, no drainage or infection noted today.  Continue to follow with surgery as indicated.  AAA/dilated ascending aorta --Incidental finding on recent imaging/hospital work-up as above.  Recommend annual follow-up per [place] physicians in the future.  Avoid fluoroquinolones and heavy lifting.  Added fluoroquinolones to his intolerances.  Recommend heart rate and blood pressure control, as well as glycemic and LDL control if needed.  Consider ASA/statin in the future --will defer for now and reassess at RTC.  Anemia --Suspect anemia of chronic dz. Stable H&H on recent labs. No s/sx of bleeding.  CKD --Recent labs show stable to improving renal function.   Medication changes: -- Increase back to carvedilol 3.125 mg twice daily Labs ordered:  -- None Studies / Imaging ordered:  --Lexiscan Myoview --Letter provided for [place] regarding plan of care Future considerations:  --Consider ASA, statin (pending MPI and obtaining lipids, liver) --Escalation of GDMT / BP and heart  rate; Reassess if diuretic needed --Further work-up as indicated by MPI results, if any  --Annual imaging of aorta per [place] physicians, monitoring of valvular dz Disposition:  -- RTC after MPI  *Please be aware that the above documentation was completed voice recognition software and may contain dictation errors.     Lennon Alstrom, PA-C

## 2021-05-02 ENCOUNTER — Ambulatory Visit: Payer: Self-pay

## 2021-05-12 ENCOUNTER — Ambulatory Visit: Payer: Self-pay | Admitting: Nurse Practitioner

## 2021-06-02 ENCOUNTER — Ambulatory Visit: Payer: Self-pay | Admitting: Physician Assistant

## 2021-06-05 ENCOUNTER — Encounter: Payer: Self-pay | Admitting: Physician Assistant

## 2022-04-17 IMAGING — CT CT ANGIO CHEST
2 of 6 series · 18 of 46 positions shown · IV contrast (APPLIED)
Comparison: CT abdomen pelvis February 20, 2021.

CLINICAL DATA: Worsening abdominal pain radiating to the chest.

EXAM:
CT ANGIOGRAPHY CHEST WITH CONTRAST
TECHNIQUE: Multidetector CT imaging of the chest was performed using the
standard protocol during bolus administration of intravenous
contrast. Multiplanar CT image reconstructions and MIPs were
obtained to evaluate the vascular anatomy.
CONTRAST:  75mL OMNIPAQUE IOHEXOL 350 MG/ML SOLN

[Series 6: thins · axial · 0.73mm/px · z∈[-681,-426]mm · 15 of 281 slices shown]
[im 13/281  lung]
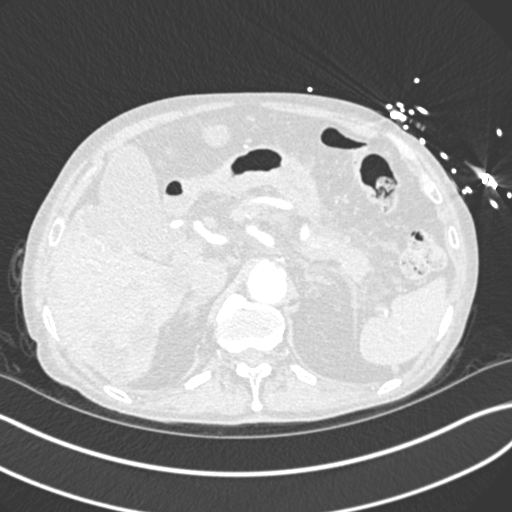
[im 37/281  soft-tissue]
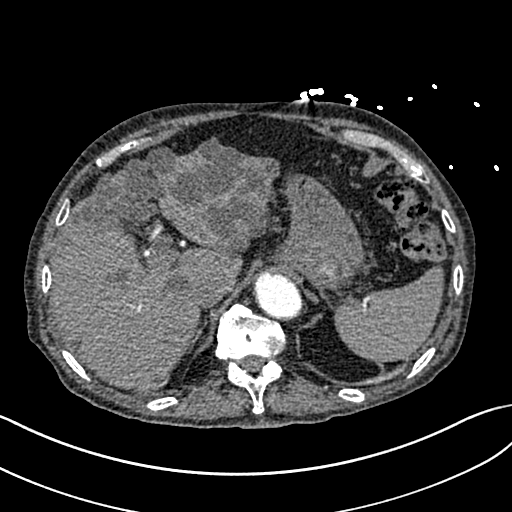
[im 49/281  lung]
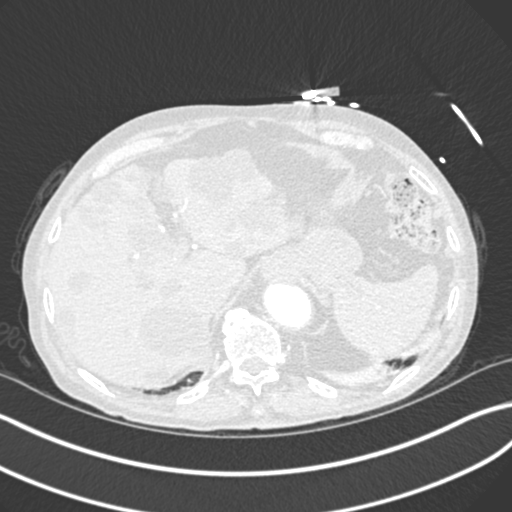
[im 74/281  soft-tissue]
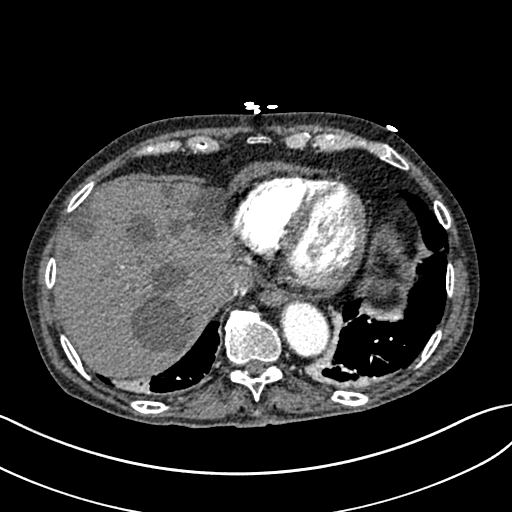
[im 86/281  lung]
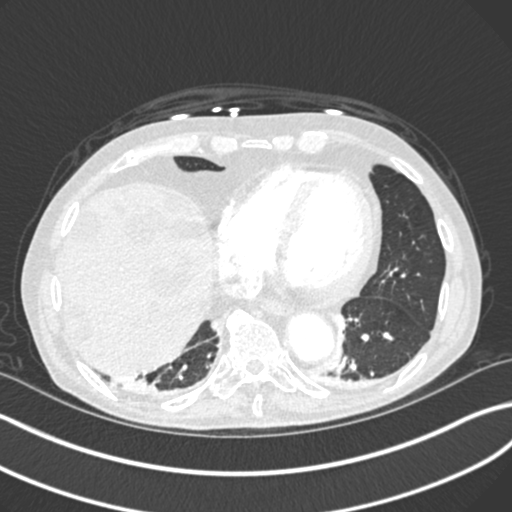
[im 110/281  soft-tissue]
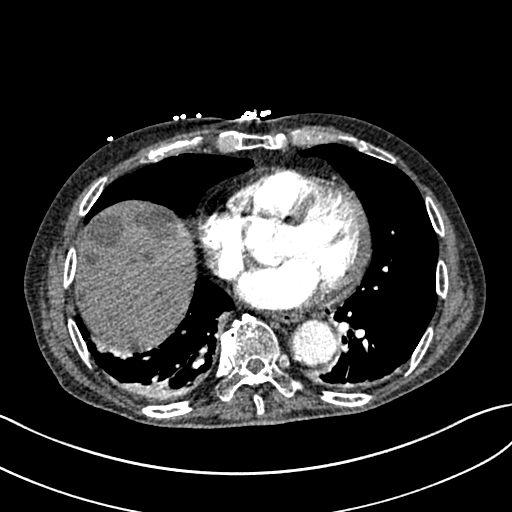
[im 122/281  lung]
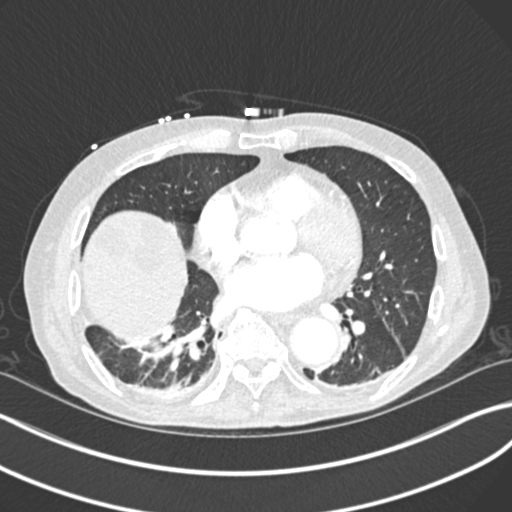
[im 147/281  soft-tissue]
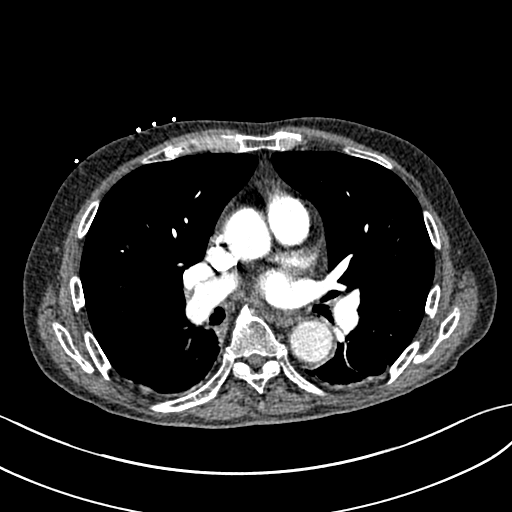
[im 159/281  lung]
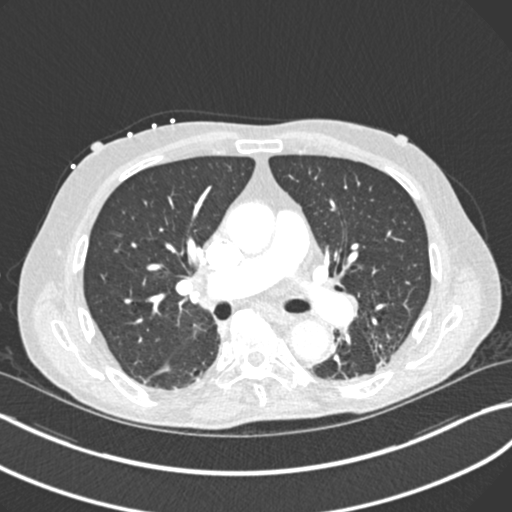
[im 171/281  soft-tissue]
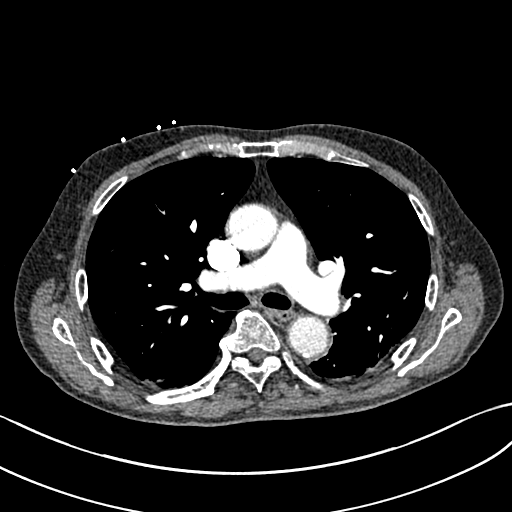
[im 195/281  lung]
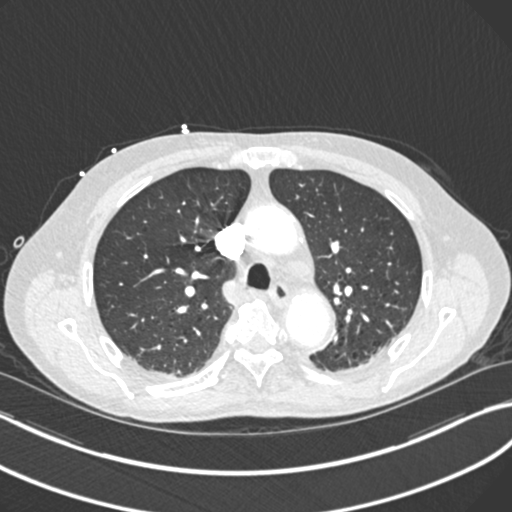
[im 207/281  soft-tissue]
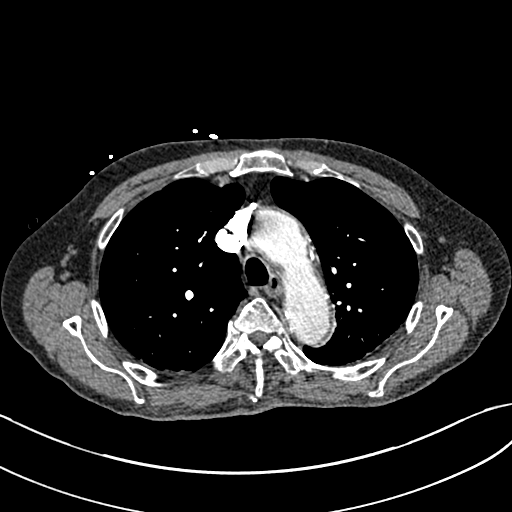
[im 232/281  lung]
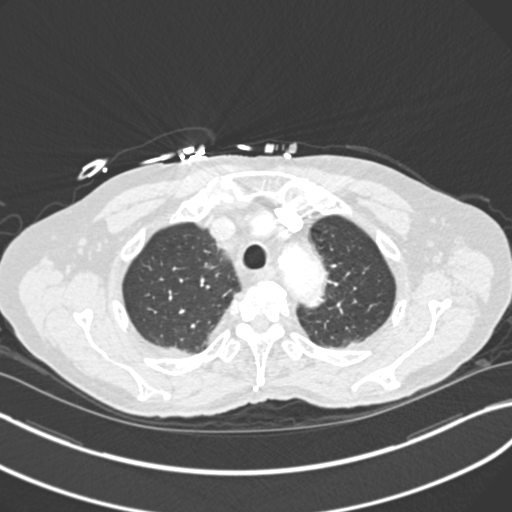
[im 244/281  soft-tissue]
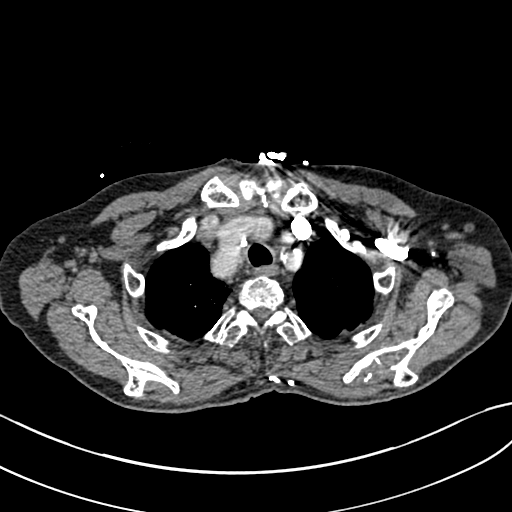
[im 268/281  lung]
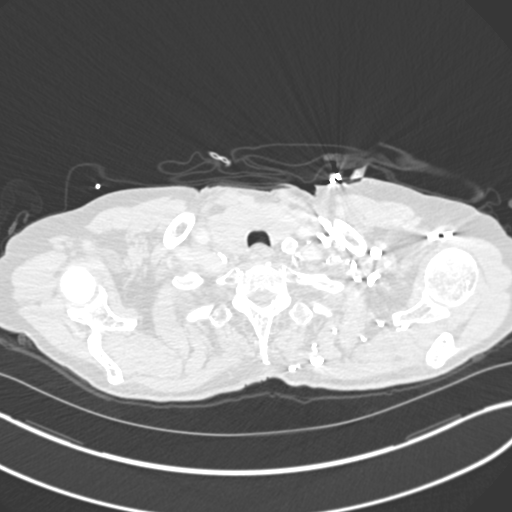

[Series 8: coronal mpr · coronal · 0.58mm/px · 3 of 128 slices shown]
[im 32/128  soft-tissue]
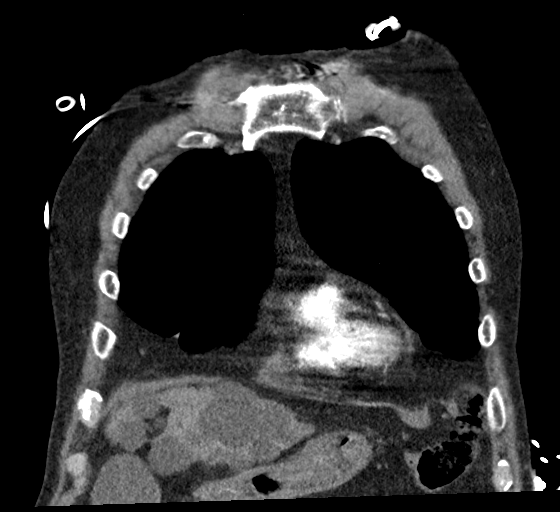
[im 64/128  soft-tissue]
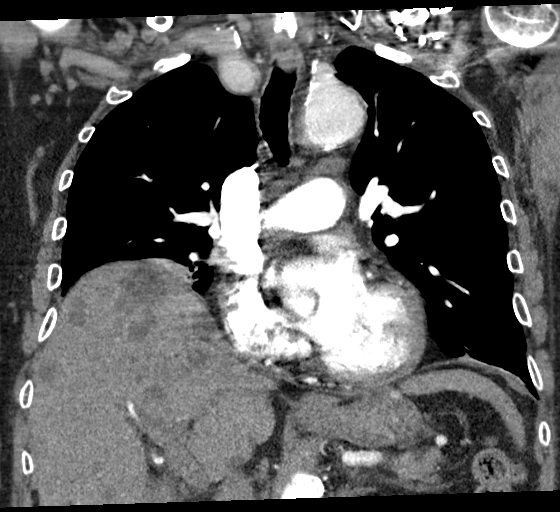
[im 96/128  soft-tissue]
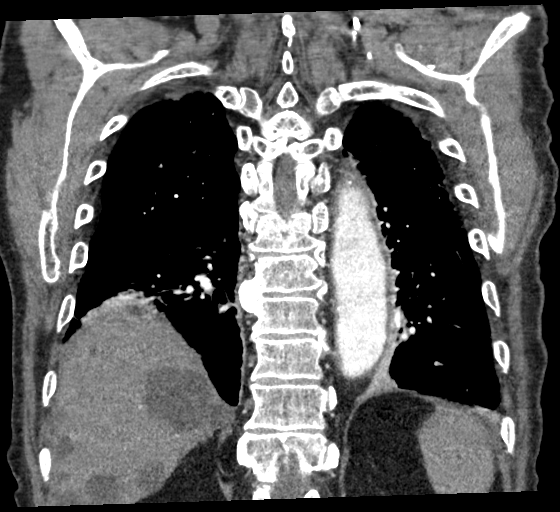

[18 of 46 positions shown; findings below may reference images not displayed]

FINDINGS: Cardiovascular: Satisfactory opacification of the pulmonary arteries
to the segmental level. No evidence of pulmonary embolism. Aortic
atherosclerosis. The ascending aorta measures 3.4 cm in maximal
diameter. There are few areas of focal enlargement involving the
thoracic aorta 1 in the aortic arch measures 3.6 cm in maximal
diameter and another in the descending aorta measures 3.6 cm in
maximal diameter. The heart size. No pericardial effusion.

Mediastinum/Nodes: No enlarged mediastinal, hilar, or axillary lymph
nodes. Thyroid gland, trachea, and esophagus demonstrate no
significant findings.

Lungs/Pleura: Bibasilar atelectasis. Left upper lobe calcified
granuloma. Mild emphysematous change. No pleural effusion. No
pneumothorax.

Upper Abdomen: Polycystic liver disease. Distended gallbladder
without signs gallbladder inflammation. 2.9 cm cyst in the spleen.

Musculoskeletal: Multilevel degenerative changes spine. No acute
osseous abnormality.

Review of the MIP images confirms the above findings.
IMPRESSION: 1. No evidence of pulmonary embolism.
2. Focal areas of aortic ectasia involving the thoracic aorta,
involving the aortic arch and descending aorta both of which measure
3.6 cm in maximal diameter. Recommend annual imaging followup by CTA
or MRA. This recommendation follows 1959
ACCF/AHA/AATS/ACR/ASA/SCA/COCCINELLA/ALESENKO/SALOMOND/NICOLAU Guidelines for the
Diagnosis and Management of Patients with Thoracic Aortic Disease.
Circulation.1959; 121: E266-e369. Aortic aneurysm NOS (IK5B3-TS1.M)
3. Polycystic liver disease.
4. Distended gallbladder without signs gallbladder inflammation.
5. Emphysema and aortic atherosclerosis.

Aortic Atherosclerosis (IK5B3-DR6.6) and Emphysema (IK5B3-0EF.6).

## 2022-04-26 IMAGING — DX DG CHEST 1V PORT
1 series · 1 of 1 positions shown · non-contrast
Comparison: 02/21/2021

CLINICAL DATA: Nasogastric tube insertion

EXAM:
PORTABLE CHEST 1 VIEW

[chest ap]
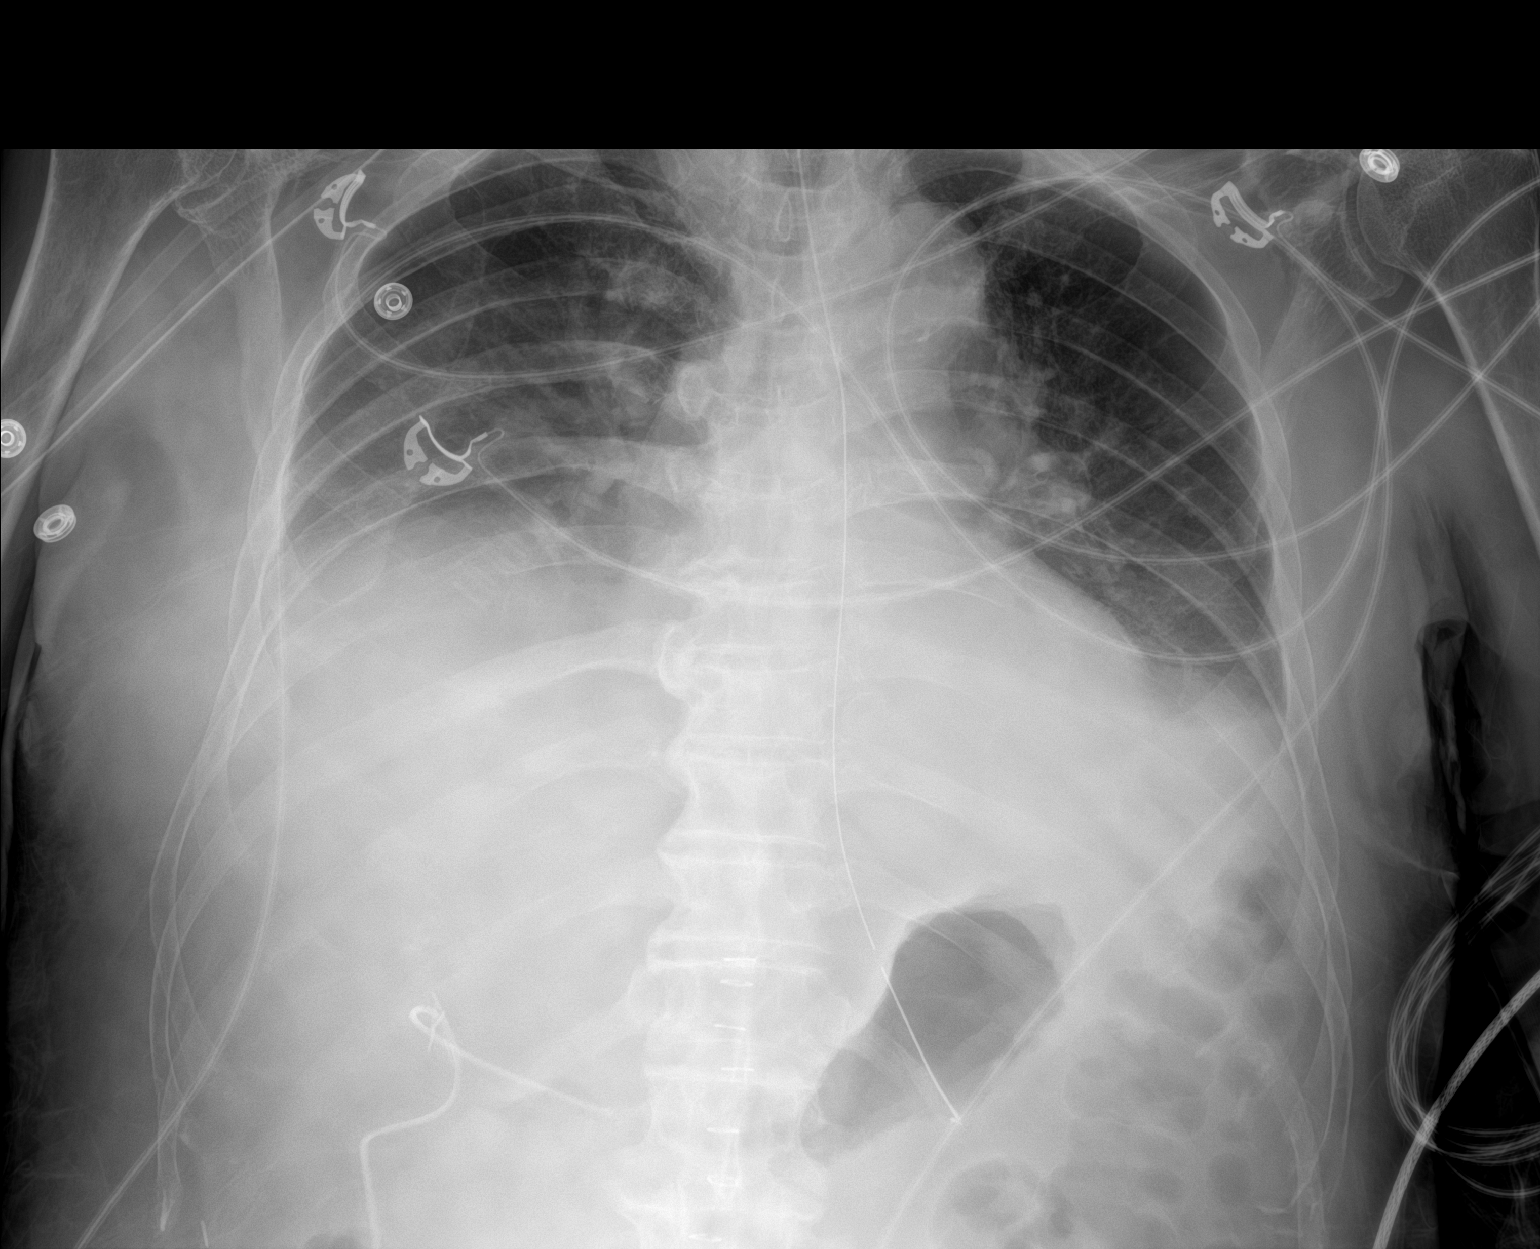

[1 of 1 positions shown; findings below may reference images not displayed]

FINDINGS: Enteric tube with tip and side-port over the stomach.

Postoperative right upper quadrant with drain.

Worsening chest with hazy density on the right more than left. No
superimposed Kerley lines or pneumothorax. Normal heart size.
IMPRESSION: 1. New enteric tube in good position.
2. Low volume chest with atelectasis and pleural effusions.

## 2024-02-20 ENCOUNTER — Emergency Department: Payer: MEDICAID

## 2024-02-20 ENCOUNTER — Encounter: Payer: Self-pay | Admitting: Emergency Medicine

## 2024-02-20 ENCOUNTER — Inpatient Hospital Stay
Admission: EM | Admit: 2024-02-20 | Discharge: 2024-02-23 | DRG: 871 | Disposition: A | Discharge status: Home / Self Care | Payer: MEDICAID | Attending: Internal Medicine | Admitting: Internal Medicine

## 2024-02-20 ENCOUNTER — Other Ambulatory Visit: Payer: Self-pay

## 2024-02-20 DIAGNOSIS — Z888 Allergy status to other drugs, medicaments and biological substances status: Secondary | ICD-10-CM

## 2024-02-20 DIAGNOSIS — A419 Sepsis, unspecified organism: Principal | ICD-10-CM | POA: Diagnosis present

## 2024-02-20 DIAGNOSIS — N179 Acute kidney failure, unspecified: Secondary | ICD-10-CM | POA: Diagnosis present

## 2024-02-20 DIAGNOSIS — I2510 Atherosclerotic heart disease of native coronary artery without angina pectoris: Secondary | ICD-10-CM | POA: Diagnosis present

## 2024-02-20 DIAGNOSIS — K219 Gastro-esophageal reflux disease without esophagitis: Secondary | ICD-10-CM | POA: Diagnosis present

## 2024-02-20 DIAGNOSIS — R7881 Bacteremia: Secondary | ICD-10-CM | POA: Diagnosis present

## 2024-02-20 DIAGNOSIS — I723 Aneurysm of iliac artery: Secondary | ICD-10-CM | POA: Insufficient documentation

## 2024-02-20 DIAGNOSIS — Z9049 Acquired absence of other specified parts of digestive tract: Secondary | ICD-10-CM

## 2024-02-20 DIAGNOSIS — D696 Thrombocytopenia, unspecified: Secondary | ICD-10-CM | POA: Diagnosis present

## 2024-02-20 DIAGNOSIS — I714 Abdominal aortic aneurysm, without rupture, unspecified: Secondary | ICD-10-CM | POA: Insufficient documentation

## 2024-02-20 DIAGNOSIS — K5900 Constipation, unspecified: Secondary | ICD-10-CM

## 2024-02-20 DIAGNOSIS — Z79899 Other long term (current) drug therapy: Secondary | ICD-10-CM

## 2024-02-20 DIAGNOSIS — Z1152 Encounter for screening for COVID-19: Secondary | ICD-10-CM

## 2024-02-20 DIAGNOSIS — J9601 Acute respiratory failure with hypoxia: Secondary | ICD-10-CM | POA: Diagnosis present

## 2024-02-20 DIAGNOSIS — I4510 Unspecified right bundle-branch block: Secondary | ICD-10-CM | POA: Diagnosis present

## 2024-02-20 DIAGNOSIS — I10 Essential (primary) hypertension: Secondary | ICD-10-CM | POA: Diagnosis present

## 2024-02-20 DIAGNOSIS — A4151 Sepsis due to Escherichia coli [E. coli]: Principal | ICD-10-CM | POA: Diagnosis present

## 2024-02-20 DIAGNOSIS — E872 Acidosis, unspecified: Secondary | ICD-10-CM | POA: Diagnosis present

## 2024-02-20 DIAGNOSIS — B9620 Unspecified Escherichia coli [E. coli] as the cause of diseases classified elsewhere: Secondary | ICD-10-CM | POA: Diagnosis present

## 2024-02-20 DIAGNOSIS — J189 Pneumonia, unspecified organism: Secondary | ICD-10-CM | POA: Diagnosis present

## 2024-02-20 DIAGNOSIS — I471 Supraventricular tachycardia, unspecified: Secondary | ICD-10-CM

## 2024-02-20 DIAGNOSIS — Z8679 Personal history of other diseases of the circulatory system: Secondary | ICD-10-CM

## 2024-02-20 DIAGNOSIS — R6520 Severe sepsis without septic shock: Secondary | ICD-10-CM | POA: Diagnosis present

## 2024-02-20 LAB — COMPREHENSIVE METABOLIC PANEL WITH GFR
ALT: 15 U/L (ref 0–44)
AST: 29 U/L (ref 15–41)
Albumin: 3.5 g/dL (ref 3.5–5.0)
Alkaline Phosphatase: 63 U/L (ref 38–126)
Anion gap: 12 (ref 5–15)
BUN: 25 mg/dL — ABNORMAL HIGH (ref 8–23)
CO2: 20 mmol/L — ABNORMAL LOW (ref 22–32)
Calcium: 8.5 mg/dL — ABNORMAL LOW (ref 8.9–10.3)
Chloride: 101 mmol/L (ref 98–111)
Creatinine, Ser: 1.23 mg/dL (ref 0.61–1.24)
GFR, Estimated: 56 mL/min — ABNORMAL LOW (ref >60)
Glucose, Bld: 149 mg/dL — ABNORMAL HIGH (ref 70–99)
Potassium: 3.8 mmol/L (ref 3.5–5.1)
Sodium: 133 mmol/L — ABNORMAL LOW (ref 135–145)
Total Bilirubin: 1.3 mg/dL — ABNORMAL HIGH (ref 0.0–1.2)
Total Protein: 7.6 g/dL (ref 6.5–8.1)

## 2024-02-20 LAB — CBC WITH DIFFERENTIAL/PLATELET
Abs Immature Granulocytes: 0.05 10*3/uL (ref 0.00–0.07)
Basophils Absolute: 0 10*3/uL (ref 0.0–0.1)
Basophils Relative: 0 %
Eosinophils Absolute: 0.1 10*3/uL (ref 0.0–0.5)
Eosinophils Relative: 1 %
HCT: 39.4 % (ref 39.0–52.0)
Hemoglobin: 13.7 g/dL (ref 13.0–17.0)
Immature Granulocytes: 0 %
Lymphocytes Relative: 17 %
Lymphs Abs: 2.2 10*3/uL (ref 0.7–4.0)
MCH: 29.7 pg (ref 26.0–34.0)
MCHC: 34.8 g/dL (ref 30.0–36.0)
MCV: 85.5 fL (ref 80.0–100.0)
Monocytes Absolute: 0.3 10*3/uL (ref 0.1–1.0)
Monocytes Relative: 3 %
Neutro Abs: 10 10*3/uL — ABNORMAL HIGH (ref 1.7–7.7)
Neutrophils Relative %: 79 %
Platelets: 111 10*3/uL — ABNORMAL LOW (ref 150–400)
RBC: 4.61 MIL/uL (ref 4.22–5.81)
RDW: 12.7 % (ref 11.5–15.5)
WBC: 12.7 10*3/uL — ABNORMAL HIGH (ref 4.0–10.5)
nRBC: 0 % (ref 0.0–0.2)

## 2024-02-20 LAB — RESP PANEL BY RT-PCR (RSV, FLU A&B, COVID)  RVPGX2
Influenza A by PCR: NEGATIVE
Influenza B by PCR: NEGATIVE
Resp Syncytial Virus by PCR: NEGATIVE
SARS Coronavirus 2 by RT PCR: NEGATIVE

## 2024-02-20 LAB — LACTIC ACID, PLASMA
Lactic Acid, Venous: 2.3 mmol/L (ref 0.5–1.9)
Lactic Acid, Venous: 1.8 mmol/L (ref 0.5–1.9)

## 2024-02-20 LAB — URINALYSIS, W/ REFLEX TO CULTURE (INFECTION SUSPECTED)
Bacteria, UA: NONE SEEN
Bilirubin Urine: NEGATIVE
Glucose, UA: NEGATIVE mg/dL
Hgb urine dipstick: NEGATIVE
Ketones, ur: NEGATIVE mg/dL
Leukocytes,Ua: NEGATIVE
Nitrite: NEGATIVE
Protein, ur: 100 mg/dL — AB
Specific Gravity, Urine: 1.033 — ABNORMAL HIGH (ref 1.005–1.030)
pH: 6 (ref 5.0–8.0)

## 2024-02-20 LAB — PROTIME-INR
INR: 1.1 (ref 0.8–1.2)
Prothrombin Time: 14.5 s (ref 11.4–15.2)

## 2024-02-20 MED ORDER — LACTATED RINGERS IV BOLUS (SEPSIS)
1000.0000 mL | Freq: Once | INTRAVENOUS | Status: AC
Start: 2024-02-20 — End: 2024-02-20
  Administered 2024-02-20: 1000 mL via INTRAVENOUS

## 2024-02-20 MED ORDER — IOHEXOL 300 MG/ML  SOLN
100.0000 mL | Freq: Once | INTRAMUSCULAR | Status: AC | PRN
  Administered 2024-02-20: 100 mL via INTRAVENOUS

## 2024-02-20 MED ORDER — ONDANSETRON HCL 4 MG/2ML IJ SOLN
4.0000 mg | Freq: Once | INTRAMUSCULAR | Status: AC
  Administered 2024-02-20: 4 mg via INTRAVENOUS
  Filled 2024-02-20: qty 2

## 2024-02-20 MED ORDER — SODIUM CHLORIDE 0.9 % IV SOLN
2.0000 g | Freq: Once | INTRAVENOUS | Status: AC
  Administered 2024-02-20: 2 g via INTRAVENOUS
  Filled 2024-02-20: qty 12.5

## 2024-02-20 MED ORDER — METRONIDAZOLE 500 MG/100ML IV SOLN
500.0000 mg | Freq: Once | INTRAVENOUS | Status: AC
  Administered 2024-02-20: 500 mg via INTRAVENOUS
  Filled 2024-02-20: qty 100

## 2024-02-20 MED ORDER — LACTATED RINGERS IV SOLN: INTRAVENOUS | Status: DC

## 2024-02-20 MED ORDER — LACTATED RINGERS IV BOLUS (SEPSIS)
1000.0000 mL | Freq: Once | INTRAVENOUS | Status: AC
  Administered 2024-02-20: 1000 mL via INTRAVENOUS

## 2024-02-20 MED ORDER — ACETAMINOPHEN 10 MG/ML IV SOLN
1000.0000 mg | Freq: Once | INTRAVENOUS | Status: AC
  Administered 2024-02-20: 1000 mg via INTRAVENOUS
  Filled 2024-02-20: qty 100

## 2024-02-20 MED ORDER — VANCOMYCIN HCL IN DEXTROSE 1-5 GM/200ML-% IV SOLN
1000.0000 mg | Freq: Once | INTRAVENOUS | Status: AC
  Administered 2024-02-20: 1000 mg via INTRAVENOUS
  Filled 2024-02-20: qty 200

## 2024-02-20 NOTE — ED Notes (Signed)
 Patient placed on 2L of oxygen for sats dropping to 88%.

## 2024-02-20 NOTE — Progress Notes (Signed)
 CODE SEPSIS - PHARMACY COMMUNICATION  **Broad Spectrum Antibiotics should be administered within 1 hour of Sepsis diagnosis**  Time Code Sepsis Called/Page Received: 2121  Antibiotics Ordered: Cefepime , vancomycin , metronidazole   Time of 1st antibiotic administration: 2127  Additional action taken by pharmacy: N/A  Page Boast  02/20/2024  9:28 PM

## 2024-02-20 NOTE — ED Provider Notes (Signed)
 [facility] Provider Note    Event Date/Time   First MD Initiated Contact with Patient 02/20/24 2058     (approximate)   History   Fever   HPI Travis Gallagher is a 89 y.o. male with history of HTN, GERD presenting today for fever.  Family provides most the history at bedside.  They state he had new onset fever that started today associated with cough and congestion.  He has been complaining of abdominal pain throughout that started today as well as dysuria symptoms.  Denies any chest pain or difficulty breathing.  No diarrhea or constipation.  Denies chronic alcohol use.  Chart review: Patient had hospitalization in 2022 for bacteremia.  At that time he had acute biliary pancreatitis but no evidence of choledocholithiasis.  Had laparoscopic cholecystectomy.     Physical Exam   Triage Vital Signs: ED Triage Vitals  Encounter Vitals Group     BP 02/20/24 2049 (!) 152/80     Systolic BP Percentile --      Diastolic BP Percentile --      Pulse Rate 02/20/24 2049 (!) 105     Resp 02/20/24 2049 18     Temp 02/20/24 2049 (!) 104 F (40 C)     Temp Source 02/20/24 2049 Rectal     SpO2 02/20/24 2044 97 %     Weight 02/20/24 2048 136 lb 7.4 oz (61.9 kg)     Height 02/20/24 2048 5\' 8"  (1.727 m)     Head Circumference --      Peak Flow --      Pain Score --      Pain Loc --      Pain Education --      Exclude from Growth Chart --     Most recent vital signs: Vitals:   02/20/24 2230 02/20/24 2300  BP: 139/61 (!) 145/62  Pulse: 90 86  Resp: (!) 26 (!) 23  Temp:    SpO2: 96% 90%   Physical Exam: I have reviewed the vital signs and nursing notes. General: Awake, alert, no acute distress.  Ill-appearing.  Warm. Head:  Atraumatic, normocephalic.   ENT:  EOM intact, PERRL. Oral mucosa is pink and moist with no lesions. Neck: Neck is supple with full range of motion, No meningeal signs. Cardiovascular: Tachycardia, RR, No murmurs. Peripheral  pulses palpable and equal bilaterally. Respiratory:  Symmetrical chest wall expansion.  Diminished air movement with slight crackles. Musculoskeletal:  No cyanosis or edema. Moving extremities with full ROM Abdomen:  Soft, tender to palpation throughout the abdomen, nondistended. Neuro:  GCS 15, moving all four extremities, interacting appropriately. Speech clear. Psych:  Calm, appropriate.   Skin:  Warm, dry, no rash.     ED Results / Procedures / Treatments   Labs (all labs ordered are listed, but only abnormal results are displayed) Labs Reviewed  COMPREHENSIVE METABOLIC PANEL WITH GFR - Abnormal; Notable for the following components:      Result Value   Sodium 133 (*)    CO2 20 (*)    Glucose, Bld 149 (*)    BUN 25 (*)    Calcium 8.5 (*)    Total Bilirubin 1.3 (*)    GFR, Estimated 56 (*)    All other components within normal limits  LACTIC ACID, PLASMA - Abnormal; Notable for the following components:   Lactic Acid, Venous 2.3 (*)    All other components within normal limits  CBC WITH DIFFERENTIAL/PLATELET -  Abnormal; Notable for the following components:   WBC 12.7 (*)    Platelets 111 (*)    Neutro Abs 10.0 (*)    All other components within normal limits  URINALYSIS, W/ REFLEX TO CULTURE (INFECTION SUSPECTED) - Abnormal; Notable for the following components:   Color, Urine YELLOW (*)    APPearance CLEAR (*)    Specific Gravity, Urine 1.033 (*)    Protein, ur 100 (*)    All other components within normal limits  RESP PANEL BY RT-PCR (RSV, FLU A&B, COVID)  [id number]  CULTURE, BLOOD (ROUTINE X 2)  CULTURE, BLOOD (ROUTINE X 2)  PROTIME-INR  LACTIC ACID, PLASMA     EKG My EKG interpretation: Rate of 106, sinus tachycardia, occasional PVCs.  Incomplete right bundle branch block.  No acute ST elevations or depressions   RADIOLOGY Independently interpreted chest x-ray with questionable groundglass opacities which could represent pneumonia.  Independently interpreted  CT abdomen/pelvis with no other acute intra-abdominal findings   PROCEDURES:  Critical Care performed: Yes, see critical care procedure note(s)  .Critical Care  Performed by: Kandee Orion, MD Authorized by: Kandee Orion, MD   Critical care provider statement:    Critical care time (minutes):  30   Critical care was necessary to treat or prevent imminent or life-threatening deterioration of the following conditions:  Sepsis   Critical care was time spent personally by me on the following activities:  Development of treatment plan with patient or surrogate, discussions with consultants, evaluation of patient's response to treatment, examination of patient, ordering and review of laboratory studies, ordering and review of radiographic studies, ordering and performing treatments and interventions, pulse oximetry, re-evaluation of patient's condition and review of old charts   I assumed direction of critical care for this patient from another provider in my specialty: no     Care discussed with: admitting provider      MEDICATIONS ORDERED IN ED: Medications  lactated ringers  infusion ( Intravenous New Bag/Given 02/20/24 2259)  vancomycin  (VANCOCIN ) IVPB 1000 mg/200 mL premix (1,000 mg Intravenous New Bag/Given 02/20/24 2302)  ondansetron  (ZOFRAN ) injection 4 mg (4 mg Intravenous Given 02/20/24 2118)  acetaminophen  (OFIRMEV ) IV 1,000 mg (0 mg Intravenous Stopped 02/20/24 2130)  lactated ringers  bolus 1,000 mL (0 mLs Intravenous Stopped 02/20/24 2209)    And  lactated ringers  bolus 1,000 mL (0 mLs Intravenous Stopped 02/20/24 2258)  ceFEPIme  (MAXIPIME ) 2 g in sodium chloride  0.9 % 100 mL IVPB (0 g Intravenous Stopped 02/20/24 2158)  metroNIDAZOLE  (FLAGYL ) IVPB 500 mg (0 mg Intravenous Stopped 02/20/24 2303)  iohexol  (OMNIPAQUE ) 300 MG/ML solution 100 mL (100 mLs Intravenous Contrast Given 02/20/24 2155)     IMPRESSION / MDM / ASSESSMENT AND PLAN / ED COURSE  I reviewed the triage vital signs  and the nursing notes.                              Differential diagnosis includes, but is not limited to, sepsis, acute intra-abdominal infection such as pancreatitis, acute cystitis, COVID, flu, RSV, pneumonia, bacteremia  Patient's presentation is most consistent with acute presentation with potential threat to life or bodily function.  Patient is a 89 year old male presenting today for fever.  On arrival does meet septic criteria with fever and tachycardia.  Also found to be 88% on room air and placed on 2 L.  WBC of 12.7.  CMP otherwise reassuring.  Started on broad-spectrum antibiotics and given  30 cc/kg of fluid.  Given IV Tylenol .  CT abdomen/pelvis also ordered to evaluate for intra-abdominal infection.  Laboratory workup otherwise with reassuring CMP.  Lactic acid 2.3.  Negative for COVID, flu, RSV.  No UTI.  Chest x-ray with possible groundglass opacities representing pneumonia.  CT abdomen/pelvis shows no other acute intra-abdominal infection.  Patient was stabilization of his vital signs following fluids.  Will admit to hospitalist for sepsis management.  The patient is on the cardiac monitor to evaluate for evidence of arrhythmia and/or significant heart rate changes.     FINAL CLINICAL IMPRESSION(S) / ED DIAGNOSES   Final diagnoses:  Sepsis, due to unspecified organism, unspecified whether acute organ dysfunction present [facility])     Rx / DC Orders   ED Discharge Orders     None        Note:  This document was prepared using Dragon voice recognition software and may include unintentional dictation errors.   Kandee Orion, MD 02/20/24 [phone]

## 2024-02-20 NOTE — ED Triage Notes (Signed)
 To ER from home via EMS for report of chills and shaking since this morning. Fever for EMS, vomited on route. Had BM this morning. No sick contacts.

## 2024-02-21 DIAGNOSIS — I10 Essential (primary) hypertension: Secondary | ICD-10-CM | POA: Diagnosis present

## 2024-02-21 DIAGNOSIS — J9601 Acute respiratory failure with hypoxia: Secondary | ICD-10-CM | POA: Diagnosis present

## 2024-02-21 DIAGNOSIS — K5900 Constipation, unspecified: Secondary | ICD-10-CM | POA: Diagnosis present

## 2024-02-21 DIAGNOSIS — J189 Pneumonia, unspecified organism: Secondary | ICD-10-CM | POA: Diagnosis present

## 2024-02-21 DIAGNOSIS — Z888 Allergy status to other drugs, medicaments and biological substances status: Secondary | ICD-10-CM | POA: Diagnosis not present

## 2024-02-21 DIAGNOSIS — A4151 Sepsis due to Escherichia coli [E. coli]: Secondary | ICD-10-CM | POA: Diagnosis present

## 2024-02-21 DIAGNOSIS — I714 Abdominal aortic aneurysm, without rupture, unspecified: Secondary | ICD-10-CM | POA: Diagnosis present

## 2024-02-21 DIAGNOSIS — I723 Aneurysm of iliac artery: Secondary | ICD-10-CM | POA: Diagnosis present

## 2024-02-21 DIAGNOSIS — I471 Supraventricular tachycardia, unspecified: Secondary | ICD-10-CM

## 2024-02-21 DIAGNOSIS — A419 Sepsis, unspecified organism: Secondary | ICD-10-CM

## 2024-02-21 DIAGNOSIS — E872 Acidosis, unspecified: Secondary | ICD-10-CM | POA: Diagnosis present

## 2024-02-21 DIAGNOSIS — K219 Gastro-esophageal reflux disease without esophagitis: Secondary | ICD-10-CM | POA: Diagnosis present

## 2024-02-21 DIAGNOSIS — R6520 Severe sepsis without septic shock: Secondary | ICD-10-CM | POA: Diagnosis present

## 2024-02-21 DIAGNOSIS — I4510 Unspecified right bundle-branch block: Secondary | ICD-10-CM | POA: Diagnosis present

## 2024-02-21 DIAGNOSIS — Z8679 Personal history of other diseases of the circulatory system: Secondary | ICD-10-CM | POA: Diagnosis not present

## 2024-02-21 DIAGNOSIS — N179 Acute kidney failure, unspecified: Secondary | ICD-10-CM | POA: Diagnosis present

## 2024-02-21 DIAGNOSIS — D696 Thrombocytopenia, unspecified: Secondary | ICD-10-CM | POA: Diagnosis present

## 2024-02-21 DIAGNOSIS — Z1152 Encounter for screening for COVID-19: Secondary | ICD-10-CM | POA: Diagnosis not present

## 2024-02-21 DIAGNOSIS — Z9049 Acquired absence of other specified parts of digestive tract: Secondary | ICD-10-CM | POA: Diagnosis not present

## 2024-02-21 DIAGNOSIS — I2510 Atherosclerotic heart disease of native coronary artery without angina pectoris: Secondary | ICD-10-CM | POA: Diagnosis present

## 2024-02-21 DIAGNOSIS — Z79899 Other long term (current) drug therapy: Secondary | ICD-10-CM | POA: Diagnosis not present

## 2024-02-21 LAB — BLOOD CULTURE ID PANEL (REFLEXED) - BCID2

## 2024-02-21 LAB — BASIC METABOLIC PANEL WITH GFR
Anion gap: 8 (ref 5–15)
BUN: 20 mg/dL (ref 8–23)
CO2: 23 mmol/L (ref 22–32)
Calcium: 7.9 mg/dL — ABNORMAL LOW (ref 8.9–10.3)
Chloride: 102 mmol/L (ref 98–111)
Creatinine, Ser: 1 mg/dL (ref 0.61–1.24)
GFR, Estimated: >60 mL/min (ref >60)
Glucose, Bld: 126 mg/dL — ABNORMAL HIGH (ref 70–99)
Potassium: 3.9 mmol/L (ref 3.5–5.1)
Sodium: 133 mmol/L — ABNORMAL LOW (ref 135–145)

## 2024-02-21 LAB — CBC
HCT: 33.7 % — ABNORMAL LOW (ref 39.0–52.0)
Hemoglobin: 11.5 g/dL — ABNORMAL LOW (ref 13.0–17.0)
MCH: 29.9 pg (ref 26.0–34.0)
MCHC: 34.1 g/dL (ref 30.0–36.0)
MCV: 87.5 fL (ref 80.0–100.0)
Platelets: 84 10*3/uL — ABNORMAL LOW (ref 150–400)
RBC: 3.85 MIL/uL — ABNORMAL LOW (ref 4.22–5.81)
RDW: 12.9 % (ref 11.5–15.5)
WBC: 11.7 10*3/uL — ABNORMAL HIGH (ref 4.0–10.5)
nRBC: 0 % (ref 0.0–0.2)

## 2024-02-21 LAB — MRSA NEXT GEN BY PCR, NASAL: MRSA by PCR Next Gen: NOT DETECTED

## 2024-02-21 MED ORDER — DOCUSATE SODIUM 100 MG PO CAPS
100.0000 mg | ORAL_CAPSULE | Freq: Every day | ORAL | Status: DC
  Administered 2024-02-21 – 2024-02-23 (×2): 100 mg via ORAL
  Filled 2024-02-21 (×2): qty 1

## 2024-02-21 MED ORDER — ACETAMINOPHEN 325 MG PO TABS
650.0000 mg | ORAL_TABLET | Freq: Four times a day (QID) | ORAL | Status: DC | PRN
  Administered 2024-02-21 – 2024-02-22 (×3): 650 mg via ORAL
  Filled 2024-02-21 (×4): qty 2

## 2024-02-21 MED ORDER — SODIUM CHLORIDE 0.9 % IV SOLN
500.0000 mg | INTRAVENOUS | Status: DC
  Administered 2024-02-21 – 2024-02-22 (×2): 500 mg via INTRAVENOUS
  Filled 2024-02-21 (×2): qty 5

## 2024-02-21 MED ORDER — ENOXAPARIN SODIUM 40 MG/0.4ML IJ SOSY
40.0000 mg | PREFILLED_SYRINGE | INTRAMUSCULAR | Status: DC
  Administered 2024-02-21 – 2024-02-23 (×3): 40 mg via SUBCUTANEOUS
  Filled 2024-02-21 (×3): qty 0.4

## 2024-02-21 MED ORDER — ASPIRIN 81 MG PO TBEC: 100.0000 mg | DELAYED_RELEASE_TABLET | ORAL | Status: DC | PRN

## 2024-02-21 MED ORDER — BISACODYL 5 MG PO TBEC
5.0000 mg | DELAYED_RELEASE_TABLET | Freq: Every day | ORAL | Status: DC | PRN
  Administered 2024-02-22: 5 mg via ORAL
  Filled 2024-02-21: qty 1

## 2024-02-21 MED ORDER — ACETAMINOPHEN 650 MG RE SUPP: 650.0000 mg | Freq: Four times a day (QID) | RECTAL | Status: DC | PRN

## 2024-02-21 MED ORDER — ONDANSETRON HCL 4 MG/2ML IJ SOLN
4.0000 mg | Freq: Four times a day (QID) | INTRAMUSCULAR | Status: DC | PRN
Start: 2024-02-21 — End: 2024-02-23
  Administered 2024-02-21: 4 mg via INTRAVENOUS
  Filled 2024-02-21: qty 2

## 2024-02-21 MED ORDER — POLYETHYLENE GLYCOL 3350 17 G PO PACK
17.0000 g | PACK | Freq: Every day | ORAL | Status: DC
  Administered 2024-02-22: 17 g via ORAL
  Filled 2024-02-21 (×2): qty 1

## 2024-02-21 MED ORDER — SODIUM CHLORIDE 0.9 % IV SOLN
2.0000 g | INTRAVENOUS | Status: DC
  Administered 2024-02-21 – 2024-02-23 (×3): 2 g via INTRAVENOUS
  Filled 2024-02-21 (×4): qty 20

## 2024-02-21 MED ORDER — IBUPROFEN 400 MG PO TABS
400.0000 mg | ORAL_TABLET | Freq: Three times a day (TID) | ORAL | Status: DC | PRN
  Administered 2024-02-22: 400 mg via ORAL
  Filled 2024-02-21 (×2): qty 1

## 2024-02-21 MED ORDER — BISACODYL 10 MG RE SUPP
10.0000 mg | Freq: Once | RECTAL | Status: AC
  Administered 2024-02-21: 10 mg via RECTAL
  Filled 2024-02-21 (×2): qty 1

## 2024-02-21 MED ORDER — ONDANSETRON HCL 4 MG PO TABS
4.0000 mg | ORAL_TABLET | Freq: Four times a day (QID) | ORAL | Status: DC | PRN
Start: 2024-02-21 — End: 2024-02-23

## 2024-02-21 MED ORDER — LACTATED RINGERS IV SOLN
150.0000 mL/h | INTRAVENOUS | Status: AC
  Administered 2024-02-21: 150 mL/h via INTRAVENOUS

## 2024-02-21 MED ORDER — CARVEDILOL 3.125 MG PO TABS
3.1250 mg | ORAL_TABLET | Freq: Two times a day (BID) | ORAL | Status: DC
  Administered 2024-02-21 – 2024-02-23 (×5): 3.125 mg via ORAL
  Filled 2024-02-21 (×5): qty 1

## 2024-02-21 NOTE — Assessment & Plan Note (Signed)
-   Continue carvedilol

## 2024-02-21 NOTE — Assessment & Plan Note (Signed)
 Acute respiratory failure with hypoxia Sepsis criteria to include tachycardia, fever, hypoxia, leukocytosis and lactic acidosis Sepsis fluids Rocephin  and azithromycin  Albuterol as needed Antitussives Follow cultures

## 2024-02-21 NOTE — Plan of Care (Signed)
 Pt admitted for sepsis secondary to PNA>  Pt stated no bowel movement in 4 days.  Pt has no c/o of nausea or pain now.  Doculax supp given.  Pt and family educated with call bell.    Problem: Fluid Volume: Goal: Hemodynamic stability will improve Outcome: Progressing   Problem: Clinical Measurements: Goal: Diagnostic test results will improve Outcome: Progressing Goal: Signs and symptoms of infection will decrease Outcome: Progressing   Problem: Respiratory: Goal: Ability to maintain adequate ventilation will improve Outcome: Progressing   Problem: Education: Goal: Knowledge of General Education information will improve Description: Including pain rating scale, medication(s)/side effects and non-pharmacologic comfort measures Outcome: Progressing   Problem: Health Behavior/Discharge Planning: Goal: Ability to manage health-related needs will improve Outcome: Progressing   Problem: Clinical Measurements: Goal: Ability to maintain clinical measurements within normal limits will improve Outcome: Progressing Goal: Will remain free from infection Outcome: Progressing Goal: Diagnostic test results will improve Outcome: Progressing Goal: Respiratory complications will improve Outcome: Progressing Goal: Cardiovascular complication will be avoided Outcome: Progressing   Problem: Activity: Goal: Risk for activity intolerance will decrease Outcome: Progressing   Problem: Nutrition: Goal: Adequate nutrition will be maintained Outcome: Progressing   Problem: Coping: Goal: Level of anxiety will decrease Outcome: Progressing   Problem: Elimination: Goal: Will not experience complications related to bowel motility Outcome: Progressing Goal: Will not experience complications related to urinary retention Outcome: Progressing   Problem: Pain Managment: Goal: General experience of comfort will improve and/or be controlled Outcome: Progressing   Problem: Safety: Goal: Ability  to remain free from injury will improve Outcome: Progressing   Problem: Skin Integrity: Goal: Risk for impaired skin integrity will decrease Outcome: Progressing

## 2024-02-21 NOTE — ED Notes (Addendum)
 The pt had a prn order of ibuprofen  placed. I entered the pt's room to medicate the pt. The pt was lying supine in the bed with his head elevated. The pt appeared to have slid down in the bed. I notified Tech Porfirio Bristol that I needed assistance moving the pt up in the bed. The pt was placed in a supine position. While attempting to raise the pt in the bed he began to vomit. The pt's head was elevated. The pt continued to vomit. The pt was cleaned up and placed back on 2 liters of O2 via nasal cannula. The pt denied any increase in SOB. The attending physician Dr. Leory Rands was notified of the concern for aspiration. The pt did vomit two additional times. The pt was medicated with zofran  and the ibuprofen .

## 2024-02-21 NOTE — H&P (Addendum)
 History and Physical    Patient: Travis Gallagher JXB:[id number] DOB: [birth year] DOA: 02/20/2024 DOS: the patient was seen and examined on 02/21/2024 PCP: Pcp, No  Patient coming from: Home  Chief Complaint:  Chief Complaint  Patient presents with   Fever    HPI: Travis Gallagher is a 89 y.o. male with medical history significant for Hypertension, AAA, choledocholithiasis s/p cholecystectomy being admitted with sepsis secondary to pneumonia.  Patient developed epigastric pain about 3 days ago and according to daughter-in-law at bedside he has been constipated since.  A day prior to arrival he developed cough and chills and generalized weakness.  He had mild shortness of breath.  He had no vomiting or nausea.  He denies lower extremity pain or swelling. ED course and data review: Tmax 104 with pulse 105, BP 152/80 and O2 sats reportedly as low as 88% requiring 2 L to maintain sats in the mid 90s. Labs notable for WBC 12,700 with lactic acid 2.3--1.8.  Respiratory viral panel negative CMP, UA both unremarkable EKG, personally viewed and interpreted showing sinus tachycardia at 106 Chest x-ray showed minimal patchy retrocardiac opacities CT abdomen and pelvis showed no acute localizing process in the abdomen or pelvis.  Did show aneurysms with recommendation for vascular referral.  (Please see details of report)  Patient started on vancomycin  metronidazole  and sepsis fluids  Hospitalist consulted for admission.     Past Medical History:  Diagnosis Date   AAA (abdominal aortic aneurysm) (HCC)    a. 02/2021 CT Abd: 3.3 x 2.7cm infrarenal AAA.   Bacteremia 02/2021   Cardiomyopathy (HCC)    a. 01/2021 Echo: Ef 45-50%, diff HK - worse @ septum and inf basal walls. Gr1 DD. Nl RV size/fxn. Mild to mod MR. Mild to mod Ao sclerosis. Mild AI. Asc Ao 40mm.   Choledocholithiasis    a. 02/2021 s/p chole. Post-op course complicatd by duodenal perforation and biliary leak req repeat  lap.   Coronary artery calcification seen on CT scan    Dilated aortic root (HCC)    a. 02/2021 Echo: Asc Ao 40mm.   GERD (gastroesophageal reflux disease)    Hypertension    Intermittent RBBB    Orthostatic hypotension    Pancreatitis    PSVT (paroxysmal supraventricular tachycardia) (HCC)    a. noted during 02/2021 hospitalization for pancreatitis-->low dose bb added.   Sepsis (HCC) 02/2021   Past Surgical History:  Procedure Laterality Date   CHOLECYSTECTOMY N/A 02/26/2021   Procedure: LAPAROSCOPIC CHOLECYSTECTOMY;  Surgeon: Enid Harry, MD;  Location: [facility] OR;  Service: General;  Laterality: N/A;  90 MINUTES ROOM 5   LAPAROTOMY N/A 03/01/2021   Procedure: EXPLORATORY LAPAROTOMY - PRIMARY REPAIR OF DUODENAL PERFORATION WITH GRAHAM PATCH;  INSERTION OF 19 FR. DRAIN;  Surgeon: Lujean Sake, MD;  Location: [facility] OR;  Service: General;  Laterality: N/A;   Social History:  reports that he has never smoked. He has never used smokeless tobacco. He reports that he does not currently use alcohol. He reports that he does not currently use drugs.  Allergies  Allergen Reactions   Quinolones     Ascending aorta and abdominal aneurysm -avoid use    Family History  Problem Relation Age of Onset   Diabetes Sister    Diabetes Brother     Prior to Admission medications   Medication Sig Start Date End Date Taking? Authorizing Provider  ASPIRIN  ADULT LOW DOSE PO Take 100 mg by mouth as needed (pain).   Yes  [provider]  bisacodyl  (DULCOLAX) 10 MG suppository Place 10 mg rectally as needed for moderate constipation.   Yes [provider]  bisoprolol (ZEBETA) 5 MG tablet Take 2.5 mg by mouth daily as needed.   Yes [provider]  carvedilol  (COREG ) 3.125 MG tablet Take 1 tablet (3.125 mg total) by mouth 2 (two) times daily with a meal. Patient not taking: Reported on 02/21/2024 04/26/21   Visser, Jacquelyn D, PA-C  feeding supplement (ENSURE ENLIVE / ENSURE PLUS)  LIQD Take 237 mLs by mouth 3 (three) times daily between meals. Patient not taking: Reported on 02/21/2024 03/14/21   Gwendalyn Lemma, MD    Physical Exam: Vitals:   02/20/24 2330 02/21/24 0153 02/21/24 0209 02/21/24 0212  BP: (!) 140/74   (!) 176/81  Pulse: 74   (!) 106  Resp: (!) 24   17  Temp:  98.3 F (36.8 C)    TempSrc:  Oral    SpO2: 91%  95% 100%  Weight:      Height:       Physical Exam Vitals and nursing note reviewed.  Constitutional:      General: He is awake. He is not in acute distress.    Comments: thin  HENT:     Head: Normocephalic and atraumatic.  Cardiovascular:     Rate and Rhythm: Regular rhythm. Tachycardia present.     Heart sounds: Normal heart sounds.  Pulmonary:     Effort: Tachypnea present.     Breath sounds: Normal breath sounds.  Abdominal:     Palpations: Abdomen is soft.     Tenderness: There is abdominal tenderness in the epigastric area.  Neurological:     Mental Status: Mental status is at baseline.     Labs on Admission: I have personally reviewed following labs and imaging studies  CBC: Recent Labs  Lab 02/20/24 2057  WBC 12.7*  NEUTROABS 10.0*  HGB 13.7  HCT 39.4  MCV 85.5  PLT 111*   Basic Metabolic Panel: Recent Labs  Lab 02/20/24 2057  NA 133*  K 3.8  CL 101  CO2 20*  GLUCOSE 149*  BUN 25*  CREATININE 1.23  CALCIUM 8.5*   GFR: Estimated Creatinine Clearance: 34.9 mL/min (by C-G formula based on SCr of 1.23 mg/dL). Liver Function Tests: Recent Labs  Lab 02/20/24 2057  AST 29  ALT 15  ALKPHOS 63  BILITOT 1.3*  PROT 7.6  ALBUMIN  3.5   No results for input(s): "LIPASE", "AMYLASE" in the last 168 hours. No results for input(s): "AMMONIA" in the last 168 hours. Coagulation Profile: Recent Labs  Lab 02/20/24 2057  INR 1.1   Cardiac Enzymes: No results for input(s): "CKTOTAL", "CKMB", "CKMBINDEX", "TROPONINI" in the last 168 hours. BNP (last 3 results) No results for input(s): "PROBNP" in the last  8760 hours. HbA1C: No results for input(s): "HGBA1C" in the last 72 hours. CBG: No results for input(s): "GLUCAP" in the last 168 hours. Lipid Profile: No results for input(s): "CHOL", "HDL", "LDLCALC", "TRIG", "CHOLHDL", "LDLDIRECT" in the last 72 hours. Thyroid Function Tests: No results for input(s): "TSH", "T4TOTAL", "FREET4", "T3FREE", "THYROIDAB" in the last 72 hours. Anemia Panel: No results for input(s): "VITAMINB12", "FOLATE", "FERRITIN", "TIBC", "IRON", "RETICCTPCT" in the last 72 hours. Urine analysis:    Component Value Date/Time   COLORURINE YELLOW (A) 02/20/2024 2304   APPEARANCEUR CLEAR (A) 02/20/2024 2304   LABSPEC 1.033 (H) 02/20/2024 2304   PHURINE 6.0 02/20/2024 2304   GLUCOSEU NEGATIVE 02/20/2024 2304  HGBUR NEGATIVE 02/20/2024 2304   BILIRUBINUR NEGATIVE 02/20/2024 2304   KETONESUR NEGATIVE 02/20/2024 2304   PROTEINUR 100 (A) 02/20/2024 2304   NITRITE NEGATIVE 02/20/2024 2304   LEUKOCYTESUR NEGATIVE 02/20/2024 2304    Radiological Exams on Admission: CT ABDOMEN PELVIS W CONTRAST Result Date: 02/20/2024 CLINICAL DATA:  Abdominal pain with nausea and vomiting. History of cholecystectomy with duodenal perforation repair. EXAM: CT ABDOMEN AND PELVIS WITH CONTRAST TECHNIQUE: Multidetector CT imaging of the abdomen and pelvis was performed using the standard protocol following bolus administration of intravenous contrast. RADIATION DOSE REDUCTION: This exam was performed according to the departmental dose-optimization program which includes automated exposure control, adjustment of the mA and/or kV according to patient size and/or use of iterative reconstruction technique. CONTRAST:  OMNIPAQUE  IOHEXOL  300 MG/ML  SOLN COMPARISON:  CT abdomen and pelvis 03/07/2021 FINDINGS: Lower chest: There are minimal ground-glass opacities in the lung bases. Pleural effusions are no longer seen. Hepatobiliary: There are numerous hepatic cysts similar to the prior study measuring  up to 5.2 cm. The gallbladder is surgically absent. There is no biliary ductal dilatation. Pancreas: Unremarkable. No pancreatic ductal dilatation or surrounding inflammatory changes. Spleen: Lobulated multiloculated cystic area seen in the spleen measuring 3.7 x 3.7 by 3.3 cm, mildly increased in size compared to 2022. Spleen is normal in size. Additional smaller splenic cysts are new from prior. Adrenals/Urinary Tract: There are rounded hypodensities in both kidneys which are too small to characterize, likely cysts. There is no hydronephrosis or perinephric fluid. The adrenal glands and bladder are within normal limits. Stomach/Bowel: There is a small hiatal hernia. Stomach is within normal limits. Appendix appears normal. No evidence of bowel wall thickening, distention, or inflammatory changes. Vascular/Lymphatic: Abdominal aortic aneurysm measures 3.3 cm similar to the prior study. Right common iliac artery aneurysm measures up to 3.7 cm (previously 3.0 cm). No enlarged lymph nodes are identified. Reproductive: Uterus and bilateral adnexa are unremarkable. Other: No abdominal wall hernia or abnormality. No abdominopelvic ascites. Musculoskeletal: No fracture is seen. IMPRESSION: 1. No acute localizing process in the abdomen or pelvis. 2. Stable 3.3 cm abdominal aortic aneurysm. Recommend follow-up ultrasound every 3 years. 3. 3.7 cm right common iliac artery aneurysm (previously 3.0 cm). Recommend referral to a vascular specialist. 4. Stable hepatic cysts. 5. Multiloculated cystic lesion in the spleen, mildly increased in size compared to 2022. Additional smaller splenic cysts are new from prior. Findings are favored as benign. 6. Minimal ground-glass opacities in the lung bases, possibly atelectasis. 7. Small hiatal hernia. 8. Aortic atherosclerosis. Aortic Atherosclerosis (ICD10-I70.0). Electronically Signed   By: Tyron Gallon M.D.   On: 02/20/2024 22:58   DG Chest Port 1 View Result Date:  02/20/2024 CLINICAL DATA:  Sepsis EXAM: PORTABLE CHEST 1 VIEW COMPARISON:  Chest x-ray 03/05/2021 FINDINGS: There are minimal patchy retrocardiac opacities. The lungs are otherwise clear. There is no pleural effusion or pneumothorax. The cardiomediastinal silhouette is within normal limits. No acute fractures are seen. IMPRESSION: Minimal patchy retrocardiac opacities, atelectasis versus pneumonia. Electronically Signed   By: Tyron Gallon M.D.   On: 02/20/2024 22:12     Data Reviewed: Relevant notes from primary care and specialist visits, past discharge summaries as available in EHR, including Care Everywhere. Prior diagnostic testing as pertinent to current admission diagnoses Updated medications and problem lists for reconciliation ED course, including vitals, labs, imaging, treatment and response to treatment Triage notes, nursing and pharmacy notes and ED provider's notes Notable results as noted in HPI  Assessment and Plan: * Sepsis due to pneumonia (HCC) Acute respiratory failure with hypoxia Sepsis criteria to include tachycardia, fever, hypoxia, leukocytosis and lactic acidosis Sepsis fluids Rocephin  and azithromycin  Albuterol as needed Antitussives Follow cultures   Constipation History of cholecystectomy complicated by duodenal perforation and bile leak, 2022 No evidence of bowel obstruction or other acute intra-abdominal/intrapelvic abnormality Docusate daily with Dulcolax as needed  Aneurysm of right common iliac artery (HCC) AAA, stable at 3.3 cm CT abdomen and pelvis showing common iliac artery aneurysm 3.7 cm increased from 3 cm with recommendation to vascular Outpatient referral at discharge  History of PSVT (paroxysmal supraventricular tachycardia) (HCC) Continue carvedilol   Hypertension Continue carvedilol         DVT prophylaxis: Lovenox   Consults: none  Advance Care Planning:   Code Status: Full Code   Family Communication: Daughter-in-law at  bedside  Disposition Plan: Back to previous home environment  Severity of Illness: The appropriate patient status for this patient is INPATIENT. Inpatient status is judged to be reasonable and necessary in order to provide the required intensity of service to ensure the patient's safety. The patient's presenting symptoms, physical exam findings, and initial radiographic and laboratory data in the context of their chronic comorbidities is felt to place them at high risk for further clinical deterioration. Furthermore, it is not anticipated that the patient will be medically stable for discharge from the hospital within 2 midnights of admission.   * I certify that at the point of admission it is my clinical judgment that the patient will require inpatient hospital care spanning beyond 2 midnights from the point of admission due to high intensity of service, high risk for further deterioration and high frequency of surveillance required.*  Author: Lanetta Pion, MD 02/21/2024 2:41 AM  For on call review [email or web address].

## 2024-02-21 NOTE — Assessment & Plan Note (Signed)
 AAA, stable at 3.3 cm CT abdomen and pelvis showing common iliac artery aneurysm 3.7 cm increased from 3 cm with recommendation to vascular Outpatient referral at discharge

## 2024-02-21 NOTE — Progress Notes (Signed)
 PHARMACY - PHYSICIAN COMMUNICATION CRITICAL VALUE ALERT - BLOOD CULTURE IDENTIFICATION (BCID)  Travis Gallagher is an 89 y.o. male who presented to [facility] on 02/20/2024 with a chief complaint of fever and chills.  Assessment:  Blood culture from 4/24 with GNR, BCID detects E coli (no resistance).    Name of physician (or Provider) Contacted: Dr Leory Rands  Current antibiotics: Ceftriaxone  and azithromycin    Changes to prescribed antibiotics recommended:  Patient is on recommended antibiotics - No changes needed. Consider stopping azithromycin    Results for orders placed or performed during the hospital encounter of 02/20/24  Blood Culture ID Panel (Reflexed) (Collected: 02/20/2024  8:58 PM)  Result Value Ref Range   Enterococcus faecalis NOT DETECTED NOT DETECTED   Enterococcus Faecium NOT DETECTED NOT DETECTED   Listeria monocytogenes NOT DETECTED NOT DETECTED   Staphylococcus species NOT DETECTED NOT DETECTED   Staphylococcus aureus (BCID) NOT DETECTED NOT DETECTED   Staphylococcus epidermidis NOT DETECTED NOT DETECTED   Staphylococcus lugdunensis NOT DETECTED NOT DETECTED   Streptococcus species NOT DETECTED NOT DETECTED   Streptococcus agalactiae NOT DETECTED NOT DETECTED   Streptococcus pneumoniae NOT DETECTED NOT DETECTED   Streptococcus pyogenes NOT DETECTED NOT DETECTED   A.calcoaceticus-baumannii NOT DETECTED NOT DETECTED   Bacteroides fragilis NOT DETECTED NOT DETECTED   Enterobacterales DETECTED (A) NOT DETECTED   Enterobacter cloacae complex NOT DETECTED NOT DETECTED   Escherichia coli DETECTED (A) NOT DETECTED   Klebsiella aerogenes NOT DETECTED NOT DETECTED   Klebsiella oxytoca NOT DETECTED NOT DETECTED   Klebsiella pneumoniae NOT DETECTED NOT DETECTED   Proteus species NOT DETECTED NOT DETECTED   Salmonella species NOT DETECTED NOT DETECTED   Serratia marcescens NOT DETECTED NOT DETECTED   Haemophilus influenzae NOT DETECTED NOT DETECTED   Neisseria  meningitidis NOT DETECTED NOT DETECTED   Pseudomonas aeruginosa NOT DETECTED NOT DETECTED   Stenotrophomonas maltophilia NOT DETECTED NOT DETECTED   Candida albicans NOT DETECTED NOT DETECTED   Candida auris NOT DETECTED NOT DETECTED   Candida glabrata NOT DETECTED NOT DETECTED   Candida krusei NOT DETECTED NOT DETECTED   Candida parapsilosis NOT DETECTED NOT DETECTED   Candida tropicalis NOT DETECTED NOT DETECTED   Cryptococcus neoformans/gattii NOT DETECTED NOT DETECTED   CTX-M ESBL NOT DETECTED NOT DETECTED   Carbapenem resistance IMP NOT DETECTED NOT DETECTED   Carbapenem resistance KPC NOT DETECTED NOT DETECTED   Carbapenem resistance NDM NOT DETECTED NOT DETECTED   Carbapenem resist OXA 48 LIKE NOT DETECTED NOT DETECTED   Carbapenem resistance VIM NOT DETECTED NOT DETECTED    Valma Gazella, PharmD, BCPS, BCIDP Work Cell: [phone] 02/21/2024 12:50 PM

## 2024-02-21 NOTE — Assessment & Plan Note (Signed)
 History of cholecystectomy complicated by duodenal perforation and bile leak, 2022 No evidence of bowel obstruction or other acute intra-abdominal/intrapelvic abnormality Docusate daily with Dulcolax as needed

## 2024-02-21 NOTE — Progress Notes (Signed)
 Progress Note    Travis Gallagher  WJX:[id number] DOB: 27-Dec-1930  DOA: 02/20/2024 PCP: Pcp, No      Brief Narrative:    Medical records reviewed and are as summarized below:  Travis Gallagher is a 89 y.o. male with medical history significant for Hypertension, AAA, choledocholithiasis s/p cholecystectomy, who presented to the hospital with chills, epigastric pain, vomiting and constipation.  In the ED, he was febrile with temperature of 104 F per rectum, tachycardic and tachypneic.  Oxygen saturation dropped to 88% on room air.  BP was 144/72 on arrival.  He was admitted to the hospital for sepsis secondary to pneumonia.   Assessment/Plan:   Principal Problem:   Sepsis due to pneumonia [facility]) Active Problems:   Constipation   Hypertension   History of PSVT (paroxysmal supraventricular tachycardia) (HCC)   Abdominal aortic aneurysm (AAA) 30 to 34 mm in diameter (HCC)   Aneurysm of right common iliac artery (HCC)    Body mass index is 20.75 kg/m.   Sepsis secondary to community-acquired pneumonia: Continue IV ceftriaxone  and azithromycin .  Check MRSA PCR screen.  Follow-up blood cultures.   Acute hypoxic respiratory failure: Continue 2 L/min oxygen via [place].   Constipation: Continue Colace and Dulcolax as needed.  Add MiraLAX .   Hypertension, history of PSVT: Continue carvedilol    Aneurysm of right common iliac artery (HCC) AAA, stable at 3.3 cm CT abdomen and pelvis showing common iliac artery aneurysm 3.7 cm increased from 3 cm with recommendation to vascular Outpatient referral at discharge.      Diet Order             Diet Heart Room service appropriate? Yes; Fluid consistency: Thin  Diet effective now                            Consultants: None  Procedures: None    Medications:    carvedilol   3.125 mg Oral BID WC   docusate sodium   100 mg Oral Daily   enoxaparin  (LOVENOX ) injection  40 mg  Subcutaneous Q24H   Continuous Infusions:  azithromycin  Stopped (02/21/24 0405)   cefTRIAXone  (ROCEPHIN )  IV 2 g (02/21/24 0853)   lactated ringers  Stopped (02/21/24 0705)   lactated ringers  150 mL/hr (02/21/24 2130)     Anti-infectives (From admission, onward)    Start     Dose/Rate Route Frequency Ordered Stop   02/21/24 0900  cefTRIAXone  (ROCEPHIN ) 2 g in sodium chloride  0.9 % 100 mL IVPB        2 g 200 mL/hr over 30 Minutes Intravenous Every 24 hours 02/21/24 0203 02/26/24 0859   02/21/24 0215  azithromycin  (ZITHROMAX ) 500 mg in sodium chloride  0.9 % 250 mL IVPB        500 mg 250 mL/hr over 60 Minutes Intravenous Every 24 hours 02/21/24 0203 02/26/24 0214   02/20/24 2130  ceFEPIme  (MAXIPIME ) 2 g in sodium chloride  0.9 % 100 mL IVPB        2 g 200 mL/hr over 30 Minutes Intravenous  Once 02/20/24 2121 02/20/24 2158   02/20/24 2130  metroNIDAZOLE  (FLAGYL ) IVPB 500 mg        500 mg 100 mL/hr over 60 Minutes Intravenous  Once 02/20/24 2121 02/20/24 2303   02/20/24 2130  vancomycin  (VANCOCIN ) IVPB 1000 mg/200 mL premix        1,000 mg 200 mL/hr over 60 Minutes Intravenous  Once 02/20/24 2121 02/21/24 0002  Family Communication/Anticipated D/C date and plan/Code Status   DVT prophylaxis: enoxaparin  (LOVENOX ) injection 40 mg Start: 02/21/24 0800     Code Status: Full Code  Family Communication: Plan discussed with daughter-in-law and granddaughter at the bedside Disposition Plan: Plan to discharge home   Status is: Inpatient Remains inpatient appropriate because: Sepsis from pneumonia       Subjective:   Interval events noted.  He complains of constipation.  Breathing is better.  His daughter-in-law and granddaughter were at the bedside  Objective:    Vitals:   02/21/24 0730 02/21/24 0849 02/21/24 1015 02/21/24 1030  BP: (!) 151/66 138/60  (!) 162/82  Pulse: 80 75  (!) 119  Resp: (!) 21   19  Temp:    98.8 F (37.1 C)  TempSrc:    Oral   SpO2: 96%  100% 100%  Weight:      Height:       No data found.   Intake/Output Summary (Last 24 hours) at 02/21/2024 1120 Last data filed at 02/21/2024 0705 Gross per 24 hour  Intake 3505.41 ml  Output --  Net 3505.41 ml   Filed Weights   02/20/24 2048  Weight: 61.9 kg    Exam:  GEN: NAD SKIN: Warm and dry EYES: No pallor or icterus ENT: MMM CV: RRR PULM: CTA B ABD: soft, ND, NT, +BS CNS: AAO x 3, non focal EXT: No edema or tenderness        Data Reviewed:   I have personally reviewed following labs and imaging studies:  Labs: Labs show the following:   Basic Metabolic Panel: Recent Labs  Lab 02/20/24 2057 02/21/24 0437  NA 133* 133*  K 3.8 3.9  CL 101 102  CO2 20* 23  GLUCOSE 149* 126*  BUN 25* 20  CREATININE 1.23 1.00  CALCIUM 8.5* 7.9*   GFR Estimated Creatinine Clearance: 43 mL/min (by C-G formula based on SCr of 1 mg/dL). Liver Function Tests: Recent Labs  Lab 02/20/24 2057  AST 29  ALT 15  ALKPHOS 63  BILITOT 1.3*  PROT 7.6  ALBUMIN  3.5   No results for input(s): "LIPASE", "AMYLASE" in the last 168 hours. No results for input(s): "AMMONIA" in the last 168 hours. Coagulation profile Recent Labs  Lab 02/20/24 2057  INR 1.1    CBC: Recent Labs  Lab 02/20/24 2057 02/21/24 0437  WBC 12.7* 11.7*  NEUTROABS 10.0*  --   HGB 13.7 11.5*  HCT 39.4 33.7*  MCV 85.5 87.5  PLT 111* 84*   Cardiac Enzymes: No results for input(s): "CKTOTAL", "CKMB", "CKMBINDEX", "TROPONINI" in the last 168 hours. BNP (last 3 results) No results for input(s): "PROBNP" in the last 8760 hours. CBG: No results for input(s): "GLUCAP" in the last 168 hours. D-Dimer: No results for input(s): "DDIMER" in the last 72 hours. Hgb A1c: No results for input(s): "HGBA1C" in the last 72 hours. Lipid Profile: No results for input(s): "CHOL", "HDL", "LDLCALC", "TRIG", "CHOLHDL", "LDLDIRECT" in the last 72 hours. Thyroid function studies: No results for  input(s): "TSH", "T4TOTAL", "T3FREE", "THYROIDAB" in the last 72 hours.  Invalid input(s): "FREET3" Anemia work up: No results for input(s): "VITAMINB12", "FOLATE", "FERRITIN", "TIBC", "IRON", "RETICCTPCT" in the last 72 hours. Sepsis Labs: Recent Labs  Lab 02/20/24 2057 02/20/24 2304 02/21/24 0437  WBC 12.7*  --  11.7*  LATICACIDVEN 2.3* 1.8  --     Microbiology Recent Results (from the past 240 hours)  Resp panel by RT-PCR (RSV, Flu A&B, Covid)  Anterior Nasal Swab     Status: None   Collection Time: 02/20/24  8:57 PM   Specimen: Anterior Nasal Swab  Result Value Ref Range Status   SARS Coronavirus 2 by RT PCR NEGATIVE NEGATIVE Final    Comment: (NOTE) SARS-CoV-2 target nucleic acids are NOT DETECTED.  The SARS-CoV-2 RNA is generally detectable in upper respiratory specimens during the acute phase of infection. The lowest concentration of SARS-CoV-2 viral copies this assay can detect is 138 copies/mL. A negative result does not preclude SARS-Cov-2 infection and should not be used as the sole basis for treatment or other patient management decisions. A negative result may occur with  improper specimen collection/handling, submission of specimen other than nasopharyngeal swab, presence of viral mutation(s) within the areas targeted by this assay, and inadequate number of viral copies(<138 copies/mL). A negative result must be combined with clinical observations, patient history, and epidemiological information. The expected result is Negative.  Fact Sheet for Patients:  BloggerCourse.com  Fact Sheet for Healthcare Providers:  SeriousBroker.it  This test is no t yet approved or cleared by the [place]  FDA and  has been authorized for detection and/or diagnosis of SARS-CoV-2 by FDA under an Emergency Use Authorization (EUA). This EUA will remain  in effect (meaning this test can be used) for the duration of  the COVID-19 declaration under Section 564(b)(1) of the Act, 21 U.S.C.section 360bbb-3(b)(1), unless the authorization is terminated  or revoked sooner.       Influenza A by PCR NEGATIVE NEGATIVE Final   Influenza B by PCR NEGATIVE NEGATIVE Final    Comment: (NOTE) The Xpert Xpress SARS-CoV-2/FLU/RSV plus assay is intended as an aid in the diagnosis of influenza from Nasopharyngeal swab specimens and should not be used as a sole basis for treatment. Nasal washings and aspirates are unacceptable for Xpert Xpress SARS-CoV-2/FLU/RSV testing.  Fact Sheet for Patients: BloggerCourse.com  Fact Sheet for Healthcare Providers: SeriousBroker.it  This test is not yet approved or cleared by the [place]  FDA and has been authorized for detection and/or diagnosis of SARS-CoV-2 by FDA under an Emergency Use Authorization (EUA). This EUA will remain in effect (meaning this test can be used) for the duration of the COVID-19 declaration under Section 564(b)(1) of the Act, 21 U.S.C. section 360bbb-3(b)(1), unless the authorization is terminated or revoked.     Resp Syncytial Virus by PCR NEGATIVE NEGATIVE Final    Comment: (NOTE) Fact Sheet for Patients: BloggerCourse.com  Fact Sheet for Healthcare Providers: SeriousBroker.it  This test is not yet approved or cleared by the [place]  FDA and has been authorized for detection and/or diagnosis of SARS-CoV-2 by FDA under an Emergency Use Authorization (EUA). This EUA will remain in effect (meaning this test can be used) for the duration of the COVID-19 declaration under Section 564(b)(1) of the Act, 21 U.S.C. section 360bbb-3(b)(1), unless the authorization is terminated or revoked.  Performed at [facility], [address] [place], [place] [address]   Culture, blood (Routine x 2)     Status: None (Preliminary result)    Collection Time: 02/20/24  8:58 PM   Specimen: BLOOD LEFT ARM  Result Value Ref Range Status   Specimen Description BLOOD LEFT ARM  Final   Special Requests   Final    BOTTLES DRAWN AEROBIC AND ANAEROBIC Blood Culture results may not be optimal due to an inadequate volume of blood received in culture bottles   Culture  Setup Time   Final    ANAEROBIC  BOTTLE ONLY GRAM NEGATIVE RODS Organism ID to follow Performed at [facility], [address] [place], [place] [address]    Culture GRAM NEGATIVE RODS  Final   Report Status PENDING  Incomplete  Culture, blood (Routine x 2)     Status: None (Preliminary result)   Collection Time: 02/20/24  9:03 PM   Specimen: BLOOD RIGHT ARM  Result Value Ref Range Status   Specimen Description BLOOD RIGHT ARM  Final   Special Requests   Final    BOTTLES DRAWN AEROBIC AND ANAEROBIC Blood Culture adequate volume   Culture  Setup Time   Final    AEROBIC BOTTLE ONLY GRAM NEGATIVE RODS CRITICAL VALUE NOTED.  VALUE IS CONSISTENT WITH PREVIOUSLY REPORTED AND CALLED VALUE. Performed at [facility], [address] [place], [place] [address]    Culture GRAM NEGATIVE RODS  Final   Report Status PENDING  Incomplete  MRSA Next Gen by PCR, Nasal     Status: None   Collection Time: 02/21/24  9:29 AM   Specimen: Nasal Mucosa; Nasal Swab  Result Value Ref Range Status   MRSA by PCR Next Gen NOT DETECTED NOT DETECTED Final    Comment: (NOTE) The GeneXpert MRSA Assay (FDA approved for NASAL specimens only), is one component of a comprehensive MRSA colonization surveillance program. It is not intended to diagnose MRSA infection nor to guide or monitor treatment for MRSA infections. Test performance is not FDA approved in patients less than 67 years old. Performed at [facility], [address] [place], [place] [address]     Procedures and diagnostic studies:  CT ABDOMEN PELVIS W CONTRAST Result Date: 02/20/2024 CLINICAL  DATA:  Abdominal pain with nausea and vomiting. History of cholecystectomy with duodenal perforation repair. EXAM: CT ABDOMEN AND PELVIS WITH CONTRAST TECHNIQUE: Multidetector CT imaging of the abdomen and pelvis was performed using the standard protocol following bolus administration of intravenous contrast. RADIATION DOSE REDUCTION: This exam was performed according to the departmental dose-optimization program which includes automated exposure control, adjustment of the mA and/or kV according to patient size and/or use of iterative reconstruction technique. CONTRAST:  OMNIPAQUE  IOHEXOL  300 MG/ML  SOLN COMPARISON:  CT abdomen and pelvis 03/07/2021 FINDINGS: Lower chest: There are minimal ground-glass opacities in the lung bases. Pleural effusions are no longer seen. Hepatobiliary: There are numerous hepatic cysts similar to the prior study measuring up to 5.2 cm. The gallbladder is surgically absent. There is no biliary ductal dilatation. Pancreas: Unremarkable. No pancreatic ductal dilatation or surrounding inflammatory changes. Spleen: Lobulated multiloculated cystic area seen in the spleen measuring 3.7 x 3.7 by 3.3 cm, mildly increased in size compared to 2022. Spleen is normal in size. Additional smaller splenic cysts are new from prior. Adrenals/Urinary Tract: There are rounded hypodensities in both kidneys which are too small to characterize, likely cysts. There is no hydronephrosis or perinephric fluid. The adrenal glands and bladder are within normal limits. Stomach/Bowel: There is a small hiatal hernia. Stomach is within normal limits. Appendix appears normal. No evidence of bowel wall thickening, distention, or inflammatory changes. Vascular/Lymphatic: Abdominal aortic aneurysm measures 3.3 cm similar to the prior study. Right common iliac artery aneurysm measures up to 3.7 cm (previously 3.0 cm). No enlarged lymph nodes are identified. Reproductive: Uterus and bilateral adnexa are unremarkable.  Other: No abdominal wall hernia or abnormality. No abdominopelvic ascites. Musculoskeletal: No fracture is seen. IMPRESSION: 1. No acute localizing process in the abdomen or pelvis. 2. Stable 3.3  cm abdominal aortic aneurysm. Recommend follow-up ultrasound every 3 years. 3. 3.7 cm right common iliac artery aneurysm (previously 3.0 cm). Recommend referral to a vascular specialist. 4. Stable hepatic cysts. 5. Multiloculated cystic lesion in the spleen, mildly increased in size compared to 2022. Additional smaller splenic cysts are new from prior. Findings are favored as benign. 6. Minimal ground-glass opacities in the lung bases, possibly atelectasis. 7. Small hiatal hernia. 8. Aortic atherosclerosis. Aortic Atherosclerosis (ICD10-I70.0). Electronically Signed   By: Tyron Gallon M.D.   On: 02/20/2024 22:58   DG Chest Port 1 View Result Date: 02/20/2024 CLINICAL DATA:  Sepsis EXAM: PORTABLE CHEST 1 VIEW COMPARISON:  Chest x-ray 03/05/2021 FINDINGS: There are minimal patchy retrocardiac opacities. The lungs are otherwise clear. There is no pleural effusion or pneumothorax. The cardiomediastinal silhouette is within normal limits. No acute fractures are seen. IMPRESSION: Minimal patchy retrocardiac opacities, atelectasis versus pneumonia. Electronically Signed   By: Tyron Gallon M.D.   On: 02/20/2024 22:12               LOS: 0 days   Avalin Briley  [facility]   Pager on [email or web address]. If 7PM-7AM, please contact night-coverage at [email or web address]     02/21/2024, 11:20 AM

## 2024-02-21 NOTE — ED Notes (Signed)
 89 yom lying supine in the bed alert and oriented to most questions. The pt is warm, pink, and dry. The pt's family is at bedside. The pt is on 2 liters of O2 via nasal cannula. Call bell at the bedside.

## 2024-02-22 DIAGNOSIS — R7881 Bacteremia: Secondary | ICD-10-CM | POA: Diagnosis present

## 2024-02-22 LAB — CBC
HCT: 37.3 % — ABNORMAL LOW (ref 39.0–52.0)
Hemoglobin: 12.8 g/dL — ABNORMAL LOW (ref 13.0–17.0)
MCH: 29.8 pg (ref 26.0–34.0)
MCHC: 34.3 g/dL (ref 30.0–36.0)
MCV: 86.9 fL (ref 80.0–100.0)
Platelets: 79 10*3/uL — ABNORMAL LOW (ref 150–400)
RBC: 4.29 MIL/uL (ref 4.22–5.81)
RDW: 13.2 % (ref 11.5–15.5)
WBC: 8.9 10*3/uL (ref 4.0–10.5)
nRBC: 0 % (ref 0.0–0.2)

## 2024-02-22 LAB — BASIC METABOLIC PANEL WITH GFR
Anion gap: 4 — ABNORMAL LOW (ref 5–15)
BUN: 23 mg/dL (ref 8–23)
CO2: 26 mmol/L (ref 22–32)
Calcium: 8 mg/dL — ABNORMAL LOW (ref 8.9–10.3)
Chloride: 105 mmol/L (ref 98–111)
Creatinine, Ser: 1.42 mg/dL — ABNORMAL HIGH (ref 0.61–1.24)
GFR, Estimated: 47 mL/min — ABNORMAL LOW (ref >60)
Glucose, Bld: 98 mg/dL (ref 70–99)
Potassium: 3.9 mmol/L (ref 3.5–5.1)
Sodium: 135 mmol/L (ref 135–145)

## 2024-02-22 MED ORDER — AZITHROMYCIN 500 MG PO TABS
500.0000 mg | ORAL_TABLET | Freq: Every day | ORAL | Status: AC
  Administered 2024-02-22: 500 mg via ORAL
  Filled 2024-02-22: qty 1

## 2024-02-22 MED ORDER — LACTATED RINGERS IV SOLN: INTRAVENOUS | Status: DC

## 2024-02-22 MED ORDER — ALUM & MAG HYDROXIDE-SIMETH 200-200-20 MG/5ML PO SUSP
30.0000 mL | Freq: Four times a day (QID) | ORAL | Status: DC | PRN
  Administered 2024-02-22: 30 mL via ORAL
  Filled 2024-02-22: qty 30

## 2024-02-22 NOTE — Progress Notes (Addendum)
 Progress Note    Travis Gallagher  WUJ:[id number] DOB: [birth year]  DOA: 02/20/2024 PCP: Pcp, No      Brief Narrative:    Medical records reviewed and are as summarized below:  Travis Gallagher is a 89 y.o. male with medical history significant for Hypertension, AAA, choledocholithiasis s/p cholecystectomy, who presented to the hospital with chills, epigastric pain, vomiting and constipation.  In the ED, he was febrile with temperature of 104 F per rectum, tachycardic and tachypneic.  Oxygen saturation dropped to 88% on room air.  BP was 144/72 on arrival.  He was admitted to the hospital for sepsis secondary to pneumonia.   Assessment/Plan:   Principal Problem:   Sepsis due to pneumonia [facility]) Active Problems:   Constipation   Hypertension   History of PSVT (paroxysmal supraventricular tachycardia) (HCC)   Abdominal aortic aneurysm (AAA) 30 to 34 mm in diameter (HCC)   Aneurysm of right common iliac artery (HCC)   E coli bacteremia    Body mass index is 22.21 kg/m.   Sepsis secondary to community-acquired pneumonia: Blood culture showed E. coli.  Continue IV ceftriaxone  and azithromycin .  Follow-up sensitivity report.  MRSA screen was negative    Acute hypoxic respiratory failure: Improved.  He is tolerating room air.   Elevated creatinine, probable AKI: Creatinine up from 1-1.42.  Start slow rate "Ringer's"  lactate infusion.  Monitor BMP. Creatinine 2 years ago had been between 1.23-1.31.   Worsening thrombocytopenia: Probably related to sepsis.  Platelet down from 111-84-79.  Monitor platelet count. Platelet was 149 about 2 years ago   Constipation: Improved.  Continue laxatives as needed   Hypertension, history of PSVT: Continue carvedilol    Aneurysm of right common iliac artery (HCC) AAA, stable at 3.3 cm CT abdomen and pelvis showing common iliac artery aneurysm 3.7 cm increased from 3 cm with recommendation to  vascular Outpatient referral at discharge.      Diet Order             Diet Heart Room service appropriate? Yes; Fluid consistency: Thin  Diet effective now                            Consultants: None  Procedures: None    Medications:    azithromycin   500 mg Oral QHS   carvedilol   3.125 mg Oral BID WC   docusate sodium   100 mg Oral Daily   enoxaparin  (LOVENOX ) injection  40 mg Subcutaneous Q24H   polyethylene glycol  17 g Oral Daily   Continuous Infusions:  cefTRIAXone  (ROCEPHIN )  IV 2 g (02/22/24 0845)   lactated ringers        Anti-infectives (From admission, onward)    Start     Dose/Rate Route Frequency Ordered Stop   02/22/24 2200  azithromycin  (ZITHROMAX ) tablet 500 mg        500 mg Oral Daily at bedtime 02/22/24 0839 02/23/24 2159   02/21/24 0900  cefTRIAXone  (ROCEPHIN ) 2 g in sodium chloride  0.9 % 100 mL IVPB        2 g 200 mL/hr over 30 Minutes Intravenous Every 24 hours 02/21/24 0203 02/26/24 0859   02/21/24 0215  azithromycin  (ZITHROMAX ) 500 mg in sodium chloride  0.9 % 250 mL IVPB  Status:  Discontinued        500 mg 250 mL/hr over 60 Minutes Intravenous Every 24 hours 02/21/24 0203 02/22/24 0839   02/20/24 2130  ceFEPIme  (MAXIPIME ) 2  g in sodium chloride  0.9 % 100 mL IVPB        2 g 200 mL/hr over 30 Minutes Intravenous  Once 02/20/24 2121 02/20/24 2158   02/20/24 2130  metroNIDAZOLE  (FLAGYL ) IVPB 500 mg        500 mg 100 mL/hr over 60 Minutes Intravenous  Once 02/20/24 2121 02/20/24 2303   02/20/24 2130  vancomycin  (VANCOCIN ) IVPB 1000 mg/200 mL premix        1,000 mg 200 mL/hr over 60 Minutes Intravenous  Once 02/20/24 2121 02/21/24 0002              Family Communication/Anticipated D/C date and plan/Code Status   DVT prophylaxis: enoxaparin  (LOVENOX ) injection 40 mg Start: 02/21/24 0800     Code Status: Full Code  Family Communication: Plan discussed with daughter-in-law and granddaughter at the  bedside Disposition Plan: Plan to discharge home   Status is: Inpatient Remains inpatient appropriate because: Sepsis from pneumonia       Subjective:   Interval events noted.  No new complaints.  He is feeling better.  He was able to move his bowels yesterday.  His daughter-in-law and granddaughter were at the bedside.  Brandi, RN, was at the bedside  Objective:    Vitals:   02/21/24 2104 02/22/24 0053 02/22/24 0439 02/22/24 0722  BP: (!) 130/52 (!) 146/58 (!) 148/87 (!) 148/104  Pulse: 69 86 98 85  Resp: 16 18 18 16   Temp: 98.3 F (36.8 C) 98.3 F (36.8 C) 99.7 F (37.6 C) 99 F (37.2 C)  TempSrc:      SpO2: 97% 94% 91% 91%  Weight:      Height:       No data found.   Intake/Output Summary (Last 24 hours) at 02/22/2024 1353 Last data filed at 02/22/2024 0900 Gross per 24 hour  Intake 460 ml  Output --  Net 460 ml   Filed Weights   02/20/24 2048 02/21/24 1510  Weight: 61.9 kg 62.7 kg    Exam:  GEN: NAD SKIN: Warm and dry EYES: No pallor or icterus ENT: MMM CV: RRR PULM: CTA B ABD: soft, ND, NT, +BS CNS: AAO x 3, non focal EXT: No edema or tenderness        Data Reviewed:   I have personally reviewed following labs and imaging studies:  Labs: Labs show the following:   Basic Metabolic Panel: Recent Labs  Lab 02/20/24 2057 02/21/24 0437 02/22/24 0428  NA 133* 133* 135  K 3.8 3.9 3.9  CL 101 102 105  CO2 20* 23 26  GLUCOSE 149* 126* 98  BUN 25* 20 23  CREATININE 1.23 1.00 1.42*  CALCIUM 8.5* 7.9* 8.0*   GFR Estimated Creatinine Clearance: 30.7 mL/min (A) (by C-G formula based on SCr of 1.42 mg/dL (H)). Liver Function Tests: Recent Labs  Lab 02/20/24 2057  AST 29  ALT 15  ALKPHOS 63  BILITOT 1.3*  PROT 7.6  ALBUMIN  3.5   No results for input(s): "LIPASE", "AMYLASE" in the last 168 hours. No results for input(s): "AMMONIA" in the last 168 hours. Coagulation profile Recent Labs  Lab 02/20/24 2057  INR 1.1     CBC: Recent Labs  Lab 02/20/24 2057 02/21/24 0437 02/22/24 0428  WBC 12.7* 11.7* 8.9  NEUTROABS 10.0*  --   --   HGB 13.7 11.5* 12.8*  HCT 39.4 33.7* 37.3*  MCV 85.5 87.5 86.9  PLT 111* 84* 79*   Cardiac Enzymes: No results for input(s): "  CKTOTAL", "CKMB", "CKMBINDEX", "TROPONINI" in the last 168 hours. BNP (last 3 results) No results for input(s): "PROBNP" in the last 8760 hours. CBG: No results for input(s): "GLUCAP" in the last 168 hours. D-Dimer: No results for input(s): "DDIMER" in the last 72 hours. Hgb A1c: No results for input(s): "HGBA1C" in the last 72 hours. Lipid Profile: No results for input(s): "CHOL", "HDL", "LDLCALC", "TRIG", "CHOLHDL", "LDLDIRECT" in the last 72 hours. Thyroid function studies: No results for input(s): "TSH", "T4TOTAL", "T3FREE", "THYROIDAB" in the last 72 hours.  Invalid input(s): "FREET3" Anemia work up: No results for input(s): "VITAMINB12", "FOLATE", "FERRITIN", "TIBC", "IRON", "RETICCTPCT" in the last 72 hours. Sepsis Labs: Recent Labs  Lab 02/20/24 2057 02/20/24 2304 02/21/24 0437 02/22/24 0428  WBC 12.7*  --  11.7* 8.9  LATICACIDVEN 2.3* 1.8  --   --     Microbiology Recent Results (from the past 240 hours)  Resp panel by RT-PCR (RSV, Flu A&B, Covid) Anterior Nasal Swab     Status: None   Collection Time: 02/20/24  8:57 PM   Specimen: Anterior Nasal Swab  Result Value Ref Range Status   SARS Coronavirus 2 by RT PCR NEGATIVE NEGATIVE Final    Comment: (NOTE) SARS-CoV-2 target nucleic acids are NOT DETECTED.  The SARS-CoV-2 RNA is generally detectable in upper respiratory specimens during the acute phase of infection. The lowest concentration of SARS-CoV-2 viral copies this assay can detect is 138 copies/mL. A negative result does not preclude SARS-Cov-2 infection and should not be used as the sole basis for treatment or other patient management decisions. A negative result may occur with  improper specimen  collection/handling, submission of specimen other than nasopharyngeal swab, presence of viral mutation(s) within the areas targeted by this assay, and inadequate number of viral copies(<138 copies/mL). A negative result must be combined with clinical observations, patient history, and epidemiological information. The expected result is Negative.  Fact Sheet for Patients:  BloggerCourse.com  Fact Sheet for Healthcare Providers:  SeriousBroker.it  This test is no t yet approved or cleared by the [place]  FDA and  has been authorized for detection and/or diagnosis of SARS-CoV-2 by FDA under an Emergency Use Authorization (EUA). This EUA will remain  in effect (meaning this test can be used) for the duration of the COVID-19 declaration under Section 564(b)(1) of the Act, 21 U.S.C.section 360bbb-3(b)(1), unless the authorization is terminated  or revoked sooner.       Influenza A by PCR NEGATIVE NEGATIVE Final   Influenza B by PCR NEGATIVE NEGATIVE Final    Comment: (NOTE) The Xpert Xpress SARS-CoV-2/FLU/RSV plus assay is intended as an aid in the diagnosis of influenza from Nasopharyngeal swab specimens and should not be used as a sole basis for treatment. Nasal washings and aspirates are unacceptable for Xpert Xpress SARS-CoV-2/FLU/RSV testing.  Fact Sheet for Patients: BloggerCourse.com  Fact Sheet for Healthcare Providers: SeriousBroker.it  This test is not yet approved or cleared by the [place]  FDA and has been authorized for detection and/or diagnosis of SARS-CoV-2 by FDA under an Emergency Use Authorization (EUA). This EUA will remain in effect (meaning this test can be used) for the duration of the COVID-19 declaration under Section 564(b)(1) of the Act, 21 U.S.C. section 360bbb-3(b)(1), unless the authorization is terminated or revoked.     Resp Syncytial  Virus by PCR NEGATIVE NEGATIVE Final    Comment: (NOTE) Fact Sheet for Patients: BloggerCourse.com  Fact Sheet for Healthcare Providers: SeriousBroker.it  This test is not yet approved  or cleared by the [place]  FDA and has been authorized for detection and/or diagnosis of SARS-CoV-2 by FDA under an Emergency Use Authorization (EUA). This EUA will remain in effect (meaning this test can be used) for the duration of the COVID-19 declaration under Section 564(b)(1) of the Act, 21 U.S.C. section 360bbb-3(b)(1), unless the authorization is terminated or revoked.  Performed at [facility], [address] [place], [place] [address]   Culture, blood (Routine x 2)     Status: Abnormal (Preliminary result)   Collection Time: 02/20/24  8:58 PM   Specimen: BLOOD LEFT ARM  Result Value Ref Range Status   Specimen Description   Final    BLOOD LEFT ARM Performed at [facility], [address], [place], [place] [address]    Special Requests   Final    BOTTLES DRAWN AEROBIC AND ANAEROBIC Blood Culture results may not be optimal due to an inadequate volume of blood received in culture bottles Performed at [facility], [address] [place], [place] [address]    Culture  Setup Time   Final    ANAEROBIC BOTTLE ONLY GRAM NEGATIVE RODS CRITICAL RESULT CALLED TO, READ BACK BY AND VERIFIED WITH: Marguerite Shiley PHARMD 1204 02/21/24 AT    Culture (A)  Final    ESCHERICHIA COLI SUSCEPTIBILITIES TO FOLLOW Performed at [facility], [address], [place], [place] [address]    Report Status PENDING  Incomplete  Blood Culture ID Panel (Reflexed)     Status: Abnormal   Collection Time: 02/20/24  8:58 PM  Result Value Ref Range Status   Enterococcus faecalis NOT DETECTED NOT DETECTED Final   Enterococcus Faecium NOT DETECTED NOT DETECTED Final   Listeria monocytogenes NOT DETECTED NOT DETECTED Final    Staphylococcus species NOT DETECTED NOT DETECTED Final   Staphylococcus aureus (BCID) NOT DETECTED NOT DETECTED Final   Staphylococcus epidermidis NOT DETECTED NOT DETECTED Final   Staphylococcus lugdunensis NOT DETECTED NOT DETECTED Final   Streptococcus species NOT DETECTED NOT DETECTED Final   Streptococcus agalactiae NOT DETECTED NOT DETECTED Final   Streptococcus pneumoniae NOT DETECTED NOT DETECTED Final   Streptococcus pyogenes NOT DETECTED NOT DETECTED Final   A.calcoaceticus-baumannii NOT DETECTED NOT DETECTED Final   Bacteroides fragilis NOT DETECTED NOT DETECTED Final   Enterobacterales DETECTED (A) NOT DETECTED Final    Comment: Enterobacterales represent a large order of gram negative bacteria, not a single organism. CRITICAL RESULT CALLED TO, READ BACK BY AND VERIFIED WITH: Marguerite Shiley PHARMD 1204 02/21/24 AT    Enterobacter cloacae complex NOT DETECTED NOT DETECTED Final   Escherichia coli DETECTED (A) NOT DETECTED Final    Comment: CRITICAL RESULT CALLED TO, READ BACK BY AND VERIFIED WITH: Marguerite Shiley PHARMD 1204 02/21/24 AT    Klebsiella aerogenes NOT DETECTED NOT DETECTED Final   Klebsiella oxytoca NOT DETECTED NOT DETECTED Final   Klebsiella pneumoniae NOT DETECTED NOT DETECTED Final   Proteus species NOT DETECTED NOT DETECTED Final   Salmonella species NOT DETECTED NOT DETECTED Final   Serratia marcescens NOT DETECTED NOT DETECTED Final   Haemophilus influenzae NOT DETECTED NOT DETECTED Final   Neisseria meningitidis NOT DETECTED NOT DETECTED Final   Pseudomonas aeruginosa NOT DETECTED NOT DETECTED Final   Stenotrophomonas maltophilia NOT DETECTED NOT DETECTED Final   Candida albicans NOT DETECTED NOT DETECTED Final   Candida auris NOT DETECTED NOT DETECTED Final   Candida glabrata NOT DETECTED NOT DETECTED Final   Candida krusei NOT DETECTED  NOT DETECTED Final   Candida parapsilosis NOT DETECTED NOT DETECTED Final   Candida tropicalis NOT DETECTED NOT  DETECTED Final   Cryptococcus neoformans/gattii NOT DETECTED NOT DETECTED Final   CTX-M ESBL NOT DETECTED NOT DETECTED Final   Carbapenem resistance IMP NOT DETECTED NOT DETECTED Final   Carbapenem resistance KPC NOT DETECTED NOT DETECTED Final   Carbapenem resistance NDM NOT DETECTED NOT DETECTED Final   Carbapenem resist OXA 48 LIKE NOT DETECTED NOT DETECTED Final   Carbapenem resistance VIM NOT DETECTED NOT DETECTED Final    Comment: Performed at [facility], [address] [place], [place] [address]  Culture, blood (Routine x 2)     Status: None (Preliminary result)   Collection Time: 02/20/24  9:03 PM   Specimen: BLOOD RIGHT ARM  Result Value Ref Range Status   Specimen Description   Final    BLOOD RIGHT ARM Performed at [facility], [address], [place], [place] [address]    Special Requests   Final    BOTTLES DRAWN AEROBIC AND ANAEROBIC Blood Culture adequate volume Performed at [facility], [address] [place], [place] [address]    Culture  Setup Time   Final    IN BOTH AEROBIC AND ANAEROBIC BOTTLES GRAM NEGATIVE RODS CRITICAL VALUE NOTED.  VALUE IS CONSISTENT WITH PREVIOUSLY REPORTED AND CALLED VALUE. GRAM STAIN REVIEWED-AGREE WITH RESULT drt Performed at [facility], [address], [place], [place] [address]    Culture   Final    Hillis Lu NEGATIVE RODS IDENTIFICATION TO FOLLOW Performed at [facility], [address], [place], [place] [address]    Report Status PENDING  Incomplete  MRSA Next Gen by PCR, Nasal     Status: None   Collection Time: 02/21/24  9:29 AM   Specimen: Nasal Mucosa; Nasal Swab  Result Value Ref Range Status   MRSA by PCR Next Gen NOT DETECTED NOT DETECTED Final    Comment: (NOTE) The GeneXpert MRSA Assay (FDA approved for NASAL specimens only), is one component of a comprehensive MRSA colonization surveillance program. It is not intended to diagnose MRSA infection nor to guide or monitor  treatment for MRSA infections. Test performance is not FDA approved in patients less than 6 years old. Performed at [facility], [address] [place], [place] [address]     Procedures and diagnostic studies:  CT ABDOMEN PELVIS W CONTRAST Result Date: 02/20/2024 CLINICAL DATA:  Abdominal pain with nausea and vomiting. History of cholecystectomy with duodenal perforation repair. EXAM: CT ABDOMEN AND PELVIS WITH CONTRAST TECHNIQUE: Multidetector CT imaging of the abdomen and pelvis was performed using the standard protocol following bolus administration of intravenous contrast. RADIATION DOSE REDUCTION: This exam was performed according to the departmental dose-optimization program which includes automated exposure control, adjustment of the mA and/or kV according to patient size and/or use of iterative reconstruction technique. CONTRAST:  OMNIPAQUE  IOHEXOL  300 MG/ML  SOLN COMPARISON:  CT abdomen and pelvis 03/07/2021 FINDINGS: Lower chest: There are minimal ground-glass opacities in the lung bases. Pleural effusions are no longer seen. Hepatobiliary: There are numerous hepatic cysts similar to the prior study measuring up to 5.2 cm. The gallbladder is surgically absent. There is no biliary ductal dilatation. Pancreas: Unremarkable. No pancreatic ductal dilatation or surrounding inflammatory changes. Spleen: Lobulated multiloculated cystic area seen in the spleen measuring 3.7 x 3.7 by 3.3 cm, mildly increased in size compared to 2022. Spleen is normal in size. Additional smaller splenic  cysts are new from prior. Adrenals/Urinary Tract: There are rounded hypodensities in both kidneys which are too small to characterize, likely cysts. There is no hydronephrosis or perinephric fluid. The adrenal glands and bladder are within normal limits. Stomach/Bowel: There is a small hiatal hernia. Stomach is within normal limits. Appendix appears normal. No evidence of bowel wall thickening,  distention, or inflammatory changes. Vascular/Lymphatic: Abdominal aortic aneurysm measures 3.3 cm similar to the prior study. Right common iliac artery aneurysm measures up to 3.7 cm (previously 3.0 cm). No enlarged lymph nodes are identified. Reproductive: Uterus and bilateral adnexa are unremarkable. Other: No abdominal wall hernia or abnormality. No abdominopelvic ascites. Musculoskeletal: No fracture is seen. IMPRESSION: 1. No acute localizing process in the abdomen or pelvis. 2. Stable 3.3 cm abdominal aortic aneurysm. Recommend follow-up ultrasound every 3 years. 3. 3.7 cm right common iliac artery aneurysm (previously 3.0 cm). Recommend referral to a vascular specialist. 4. Stable hepatic cysts. 5. Multiloculated cystic lesion in the spleen, mildly increased in size compared to 2022. Additional smaller splenic cysts are new from prior. Findings are favored as benign. 6. Minimal ground-glass opacities in the lung bases, possibly atelectasis. 7. Small hiatal hernia. 8. Aortic atherosclerosis. Aortic Atherosclerosis (ICD10-I70.0). Electronically Signed   By: Tyron Gallon M.D.   On: 02/20/2024 22:58   DG Chest Port 1 View Result Date: 02/20/2024 CLINICAL DATA:  Sepsis EXAM: PORTABLE CHEST 1 VIEW COMPARISON:  Chest x-ray 03/05/2021 FINDINGS: There are minimal patchy retrocardiac opacities. The lungs are otherwise clear. There is no pleural effusion or pneumothorax. The cardiomediastinal silhouette is within normal limits. No acute fractures are seen. IMPRESSION: Minimal patchy retrocardiac opacities, atelectasis versus pneumonia. Electronically Signed   By: Tyron Gallon M.D.   On: 02/20/2024 22:12               LOS: 1 day   Dezirea Mccollister  [facility]   Pager on [email or web address]. If 7PM-7AM, please contact night-coverage at [email or web address]     02/22/2024, 1:53 PM

## 2024-02-23 LAB — CULTURE, BLOOD (ROUTINE X 2)

## 2024-02-23 LAB — BASIC METABOLIC PANEL WITH GFR
Anion gap: 9 (ref 5–15)
BUN: 26 mg/dL — ABNORMAL HIGH (ref 8–23)
CO2: 25 mmol/L (ref 22–32)
Calcium: 7.9 mg/dL — ABNORMAL LOW (ref 8.9–10.3)
Chloride: 103 mmol/L (ref 98–111)
Creatinine, Ser: 1.22 mg/dL (ref 0.61–1.24)
GFR, Estimated: 56 mL/min — ABNORMAL LOW (ref >60)
Glucose, Bld: 98 mg/dL (ref 70–99)
Potassium: 3.7 mmol/L (ref 3.5–5.1)
Sodium: 137 mmol/L (ref 135–145)

## 2024-02-23 LAB — CBC
HCT: 32.5 % — ABNORMAL LOW (ref 39.0–52.0)
Hemoglobin: 11.4 g/dL — ABNORMAL LOW (ref 13.0–17.0)
MCH: 30 pg (ref 26.0–34.0)
MCHC: 35.1 g/dL (ref 30.0–36.0)
MCV: 85.5 fL (ref 80.0–100.0)
Platelets: 73 10*3/uL — ABNORMAL LOW (ref 150–400)
RBC: 3.8 MIL/uL — ABNORMAL LOW (ref 4.22–5.81)
RDW: 13.1 % (ref 11.5–15.5)
WBC: 4.9 10*3/uL (ref 4.0–10.5)
nRBC: 0 % (ref 0.0–0.2)

## 2024-02-23 MED ORDER — ENALAPRIL MALEATE 20 MG PO TABS: 20.0000 mg | ORAL_TABLET | Freq: Every day | ORAL | 0 refills | Status: AC

## 2024-02-23 MED ORDER — ENALAPRIL MALEATE 10 MG PO TABS
20.0000 mg | ORAL_TABLET | Freq: Every day | ORAL | Status: DC
  Administered 2024-02-23: 20 mg via ORAL
  Filled 2024-02-23: qty 2

## 2024-02-23 MED ORDER — AMOXICILLIN 500 MG PO CAPS
1000.0000 mg | ORAL_CAPSULE | Freq: Three times a day (TID) | ORAL | 0 refills | Status: AC
Start: 2024-02-24 — End: 2024-02-27

## 2024-02-23 NOTE — Plan of Care (Signed)
  Problem: Fluid Volume: Goal: Hemodynamic stability will improve 02/23/2024 1301 by Drena Gentile, RN Outcome: Adequate for Discharge 02/23/2024 1301 by Drena Gentile, RN Outcome: Progressing   Problem: Clinical Measurements: Goal: Diagnostic test results will improve 02/23/2024 1301 by Drena Gentile, RN Outcome: Adequate for Discharge 02/23/2024 1301 by Drena Gentile, RN Outcome: Progressing Goal: Signs and symptoms of infection will decrease 02/23/2024 1301 by Taran Hable N, RN Outcome: Adequate for Discharge 02/23/2024 1301 by Drena Gentile, RN Outcome: Progressing   Problem: Respiratory: Goal: Ability to maintain adequate ventilation will improve 02/23/2024 1301 by Drena Gentile, RN Outcome: Adequate for Discharge 02/23/2024 1301 by Drena Gentile, RN Outcome: Progressing   Problem: Education: Goal: Knowledge of General Education information will improve Description: Including pain rating scale, medication(s)/side effects and non-pharmacologic comfort measures 02/23/2024 1301 by Dann Ventress N, RN Outcome: Adequate for Discharge 02/23/2024 1301 by Drena Gentile, RN Outcome: Progressing   Problem: Health Behavior/Discharge Planning: Goal: Ability to manage health-related needs will improve 02/23/2024 1301 by Drena Gentile, RN Outcome: Adequate for Discharge 02/23/2024 1301 by Drena Gentile, RN Outcome: Progressing   Problem: Clinical Measurements: Goal: Ability to maintain clinical measurements within normal limits will improve 02/23/2024 1301 by Drena Gentile, RN Outcome: Adequate for Discharge 02/23/2024 1301 by Drena Gentile, RN Outcome: Progressing Goal: Will remain free from infection 02/23/2024 1301 by Drena Gentile, RN Outcome: Adequate for Discharge 02/23/2024 1301 by Drena Gentile, RN Outcome: Progressing Goal: Diagnostic test results will improve 02/23/2024 1301 by Drena Gentile, RN Outcome: Adequate for  Discharge 02/23/2024 1301 by Drena Gentile, RN Outcome: Progressing Goal: Respiratory complications will improve 02/23/2024 1301 by Drena Gentile, RN Outcome: Adequate for Discharge 02/23/2024 1301 by Drena Gentile, RN Outcome: Progressing Goal: Cardiovascular complication will be avoided 02/23/2024 1301 by Drena Gentile, RN Outcome: Adequate for Discharge 02/23/2024 1301 by Drena Gentile, RN Outcome: Progressing   Problem: Activity: Goal: Risk for activity intolerance will decrease 02/23/2024 1301 by Drena Gentile, RN Outcome: Adequate for Discharge 02/23/2024 1301 by Drena Gentile, RN Outcome: Progressing   Problem: Nutrition: Goal: Adequate nutrition will be maintained 02/23/2024 1301 by Drena Gentile, RN Outcome: Adequate for Discharge 02/23/2024 1301 by Drena Gentile, RN Outcome: Progressing   Problem: Coping: Goal: Level of anxiety will decrease 02/23/2024 1301 by Keishawna Carranza N, RN Outcome: Adequate for Discharge 02/23/2024 1301 by Drena Gentile, RN Outcome: Progressing   Problem: Elimination: Goal: Will not experience complications related to bowel motility 02/23/2024 1301 by Drena Gentile, RN Outcome: Adequate for Discharge 02/23/2024 1301 by Drena Gentile, RN Outcome: Progressing Goal: Will not experience complications related to urinary retention 02/23/2024 1301 by Drena Gentile, RN Outcome: Adequate for Discharge 02/23/2024 1301 by Drena Gentile, RN Outcome: Progressing   Problem: Pain Managment: Goal: General experience of comfort will improve and/or be controlled 02/23/2024 1301 by Laveda Demedeiros N, RN Outcome: Adequate for Discharge 02/23/2024 1301 by Drena Gentile, RN Outcome: Progressing   Problem: Safety: Goal: Ability to remain free from injury will improve 02/23/2024 1301 by Drena Gentile, RN Outcome: Adequate for Discharge 02/23/2024 1301 by Drena Gentile, RN Outcome: Progressing   Problem: Skin  Integrity: Goal: Risk for impaired skin integrity will decrease 02/23/2024 1301 by Drena Gentile, RN Outcome: Adequate for Discharge 02/23/2024 1301 by Drena Gentile, RN Outcome: Progressing

## 2024-02-23 NOTE — Discharge Instructions (Signed)

## 2024-02-23 NOTE — Plan of Care (Signed)
  Problem: Fluid Volume: Goal: Hemodynamic stability will improve Outcome: Progressing   Problem: Clinical Measurements: Goal: Diagnostic test results will improve Outcome: Progressing Goal: Signs and symptoms of infection will decrease Outcome: Progressing   Problem: Education: Goal: Knowledge of General Education information will improve Description: Including pain rating scale, medication(s)/side effects and non-pharmacologic comfort measures Outcome: Progressing   Problem: Health Behavior/Discharge Planning: Goal: Ability to manage health-related needs will improve Outcome: Progressing   Problem: Clinical Measurements: Goal: Ability to maintain clinical measurements within normal limits will improve Outcome: Progressing Goal: Will remain free from infection Outcome: Progressing Goal: Diagnostic test results will improve Outcome: Progressing Goal: Respiratory complications will improve Outcome: Progressing Goal: Cardiovascular complication will be avoided Outcome: Progressing   Problem: Activity: Goal: Risk for activity intolerance will decrease Outcome: Progressing   Problem: Nutrition: Goal: Adequate nutrition will be maintained Outcome: Progressing   Problem: Coping: Goal: Level of anxiety will decrease Outcome: Progressing   Problem: Elimination: Goal: Will not experience complications related to bowel motility Outcome: Progressing Goal: Will not experience complications related to urinary retention Outcome: Progressing   Problem: Pain Managment: Goal: General experience of comfort will improve and/or be controlled Outcome: Progressing   Problem: Safety: Goal: Ability to remain free from injury will improve Outcome: Progressing   Problem: Skin Integrity: Goal: Risk for impaired skin integrity will decrease Outcome: Progressing   Problem: Respiratory: Goal: Ability to maintain adequate ventilation will improve Outcome: Not Progressing

## 2024-02-23 NOTE — Discharge Summary (Signed)
 Physician Discharge Summary   Patient: Abdel Decaprio MRN: [id number] DOB: 1931-02-01  Admit date:     02/20/2024  Discharge date: 02/23/2024  Discharge Physician: Sheril Dines   PCP: Pcp, No   Recommendations at discharge:   Follow-up with PCP in 1 week  Discharge Diagnoses: Principal Problem:   Sepsis due to pneumonia [facility]) Active Problems:   Constipation   Hypertension   History of PSVT (paroxysmal supraventricular tachycardia) (HCC)   Abdominal aortic aneurysm (AAA) 30 to 34 mm in diameter (HCC)   Aneurysm of right common iliac artery (HCC)   E coli bacteremia  Resolved Problems:   * No resolved hospital problems. *  Hospital Course:  Carold Alverto Rodriguez is a 89 y.o. male with medical history significant for Hypertension, AAA, choledocholithiasis s/p cholecystectomy, who presented to the hospital with chills, epigastric pain, vomiting and constipation.   In the ED, he was febrile with temperature of 104 F per rectum, tachycardic and tachypneic.  Oxygen saturation dropped to 88% on room air.  BP was 144/72 on arrival.   He was admitted to the hospital for sepsis secondary to pneumonia.   Assessment and Plan:   Sepsis secondary to community-acquired pneumonia: Blood culture showed E. coli.   S/p treatment with 1 day of IV cefepime  and 3 days of IV ceftriaxone  and azithromycin  . He will be discharged home 3 more days of amoxicillin to complete 7 days of treatment. Quinolones was not prescribed because of history of AAA     Acute hypoxic respiratory failure: Improved.  He is tolerating room air even with ambulation.     Elevated creatinine, probable AKI: Improved.  Creatinine down from 1.42-1.22.  Creatinine had fluctuated from 1.23-1-1.42-1.22. Creatinine 2 years ago had been between 1.23-1.31.     Worsening thrombocytopenia: Probably related to sepsis.  Platelet down from 111-84-79-73.  Monitor platelet count with CBC as an outpatient.. Platelet  was 149 about 2 years ago     Constipation: Improved.  Continue laxatives as needed     Hypertension, history of PSVT: Discontinue carvedilol .  Patient takes bisoprolol and enalapril at home although enalapril was not on his admission med rec.  Resume bisoprolol lisinopril at discharge.     Aneurysm of right common iliac artery (HCC) AAA, stable at 3.3 cm CT abdomen and pelvis showing common iliac artery aneurysm 3.7 cm increased from 3 cm with recommendation to vascular Outpatient follow-up with PCP     Plan discussed with his son, daughter-in-law, grandson and granddaughter at the bedside.  Plan discussed with Zoia, RN, at the bedside       Consultants: None Procedures performed: None Disposition: Home Diet recommendation:  Discharge Diet Orders (From admission, onward)     Start     Ordered   02/23/24 0000  Diet - low sodium heart healthy        02/23/24 1051           Cardiac diet DISCHARGE MEDICATION: Allergies as of 02/23/2024       Reactions   Quinolones    Ascending aorta and abdominal aneurysm -avoid use        Medication List     TAKE these medications    amoxicillin 500 MG capsule Commonly known as: AMOXIL Take 2 capsules (1,000 mg total) by mouth 3 (three) times daily for 3 days. Start taking on: February 24, 2024   ASPIRIN ADULT LOW DOSE PO Take 100 mg by mouth as needed (pain).   bisacodyl  10 MG  suppository Commonly known as: DULCOLAX Place 10 mg rectally as needed for moderate constipation.   bisoprolol 5 MG tablet Commonly known as: ZEBETA Take 2.5 mg by mouth daily as needed.   carvedilol  3.125 MG tablet Commonly known as: Coreg  Take 1 tablet (3.125 mg total) by mouth 2 (two) times daily with a meal.   feeding supplement Liqd Take 237 mLs by mouth 3 (three) times daily between meals.        Discharge Exam: Filed Weights   02/20/24 2048 02/21/24 1510  Weight: 61.9 kg 62.7 kg   GEN: NAD SKIN: Warm and dry EYES: No  pallor or icterus ENT: MMM CV: RRR PULM: CTA B ABD: soft, ND, NT, +BS CNS: AAO x 3, non focal EXT: No edema or tenderness   Condition at discharge: good  The results of significant diagnostics from this hospitalization (including imaging, microbiology, ancillary and laboratory) are listed below for reference.   Imaging Studies: CT ABDOMEN PELVIS W CONTRAST Result Date: 02/20/2024 CLINICAL DATA:  Abdominal pain with nausea and vomiting. History of cholecystectomy with duodenal perforation repair. EXAM: CT ABDOMEN AND PELVIS WITH CONTRAST TECHNIQUE: Multidetector CT imaging of the abdomen and pelvis was performed using the standard protocol following bolus administration of intravenous contrast. RADIATION DOSE REDUCTION: This exam was performed according to the departmental dose-optimization program which includes automated exposure control, adjustment of the mA and/or kV according to patient size and/or use of iterative reconstruction technique. CONTRAST:  OMNIPAQUE  IOHEXOL  300 MG/ML  SOLN COMPARISON:  CT abdomen and pelvis 03/07/2021 FINDINGS: Lower chest: There are minimal ground-glass opacities in the lung bases. Pleural effusions are no longer seen. Hepatobiliary: There are numerous hepatic cysts similar to the prior study measuring up to 5.2 cm. The gallbladder is surgically absent. There is no biliary ductal dilatation. Pancreas: Unremarkable. No pancreatic ductal dilatation or surrounding inflammatory changes. Spleen: Lobulated multiloculated cystic area seen in the spleen measuring 3.7 x 3.7 by 3.3 cm, mildly increased in size compared to 2022. Spleen is normal in size. Additional smaller splenic cysts are new from prior. Adrenals/Urinary Tract: There are rounded hypodensities in both kidneys which are too small to characterize, likely cysts. There is no hydronephrosis or perinephric fluid. The adrenal glands and bladder are within normal limits. Stomach/Bowel: There is a small hiatal  hernia. Stomach is within normal limits. Appendix appears normal. No evidence of bowel wall thickening, distention, or inflammatory changes. Vascular/Lymphatic: Abdominal aortic aneurysm measures 3.3 cm similar to the prior study. Right common iliac artery aneurysm measures up to 3.7 cm (previously 3.0 cm). No enlarged lymph nodes are identified. Reproductive: Uterus and bilateral adnexa are unremarkable. Other: No abdominal wall hernia or abnormality. No abdominopelvic ascites. Musculoskeletal: No fracture is seen. IMPRESSION: 1. No acute localizing process in the abdomen or pelvis. 2. Stable 3.3 cm abdominal aortic aneurysm. Recommend follow-up ultrasound every 3 years. 3. 3.7 cm right common iliac artery aneurysm (previously 3.0 cm). Recommend referral to a vascular specialist. 4. Stable hepatic cysts. 5. Multiloculated cystic lesion in the spleen, mildly increased in size compared to 2022. Additional smaller splenic cysts are new from prior. Findings are favored as benign. 6. Minimal ground-glass opacities in the lung bases, possibly atelectasis. 7. Small hiatal hernia. 8. Aortic atherosclerosis. Aortic Atherosclerosis (ICD10-I70.0). Electronically Signed   By: Tyron Gallon M.D.   On: 02/20/2024 22:58   DG Chest Port 1 View Result Date: 02/20/2024 CLINICAL DATA:  Sepsis EXAM: PORTABLE CHEST 1 VIEW COMPARISON:  Chest x-ray 03/05/2021 FINDINGS: There  are minimal patchy retrocardiac opacities. The lungs are otherwise clear. There is no pleural effusion or pneumothorax. The cardiomediastinal silhouette is within normal limits. No acute fractures are seen. IMPRESSION: Minimal patchy retrocardiac opacities, atelectasis versus pneumonia. Electronically Signed   By: Tyron Gallon M.D.   On: 02/20/2024 22:12    Microbiology: Results for orders placed or performed during the hospital encounter of 02/20/24  Resp panel by RT-PCR (RSV, Flu A&B, Covid) Anterior Nasal Swab     Status: None   Collection Time: 02/20/24   8:57 PM   Specimen: Anterior Nasal Swab  Result Value Ref Range Status   SARS Coronavirus 2 by RT PCR NEGATIVE NEGATIVE Final    Comment: (NOTE) SARS-CoV-2 target nucleic acids are NOT DETECTED.  The SARS-CoV-2 RNA is generally detectable in upper respiratory specimens during the acute phase of infection. The lowest concentration of SARS-CoV-2 viral copies this assay can detect is 138 copies/mL. A negative result does not preclude SARS-Cov-2 infection and should not be used as the sole basis for treatment or other patient management decisions. A negative result may occur with  improper specimen collection/handling, submission of specimen other than nasopharyngeal swab, presence of viral mutation(s) within the areas targeted by this assay, and inadequate number of viral copies(<138 copies/mL). A negative result must be combined with clinical observations, patient history, and epidemiological information. The expected result is Negative.  Fact Sheet for Patients:  BloggerCourse.com  Fact Sheet for Healthcare Providers:  SeriousBroker.it  This test is no t yet approved or cleared by the [place]  FDA and  has been authorized for detection and/or diagnosis of SARS-CoV-2 by FDA under an Emergency Use Authorization (EUA). This EUA will remain  in effect (meaning this test can be used) for the duration of the COVID-19 declaration under Section 564(b)(1) of the Act, 21 U.S.C.section 360bbb-3(b)(1), unless the authorization is terminated  or revoked sooner.       Influenza A by PCR NEGATIVE NEGATIVE Final   Influenza B by PCR NEGATIVE NEGATIVE Final    Comment: (NOTE) The Xpert Xpress SARS-CoV-2/FLU/RSV plus assay is intended as an aid in the diagnosis of influenza from Nasopharyngeal swab specimens and should not be used as a sole basis for treatment. Nasal washings and aspirates are unacceptable for Xpert Xpress  SARS-CoV-2/FLU/RSV testing.  Fact Sheet for Patients: BloggerCourse.com  Fact Sheet for Healthcare Providers: SeriousBroker.it  This test is not yet approved or cleared by the [place]  FDA and has been authorized for detection and/or diagnosis of SARS-CoV-2 by FDA under an Emergency Use Authorization (EUA). This EUA will remain in effect (meaning this test can be used) for the duration of the COVID-19 declaration under Section 564(b)(1) of the Act, 21 U.S.C. section 360bbb-3(b)(1), unless the authorization is terminated or revoked.     Resp Syncytial Virus by PCR NEGATIVE NEGATIVE Final    Comment: (NOTE) Fact Sheet for Patients: BloggerCourse.com  Fact Sheet for Healthcare Providers: SeriousBroker.it  This test is not yet approved or cleared by the [place]  FDA and has been authorized for detection and/or diagnosis of SARS-CoV-2 by FDA under an Emergency Use Authorization (EUA). This EUA will remain in effect (meaning this test can be used) for the duration of the COVID-19 declaration under Section 564(b)(1) of the Act, 21 U.S.C. section 360bbb-3(b)(1), unless the authorization is terminated or revoked.  Performed at [facility], [address] [place], [place] [address]   Culture, blood (Routine x 2)     Status: Abnormal (Preliminary  result)   Collection Time: 02/20/24  8:58 PM   Specimen: BLOOD LEFT ARM  Result Value Ref Range Status   Specimen Description   Final    BLOOD LEFT ARM Performed at [facility], [address] [place], [place] [address]    Special Requests   Final    BOTTLES DRAWN AEROBIC AND ANAEROBIC Blood Culture results may not be optimal due to an inadequate volume of blood received in culture bottles Performed at [facility], [address], [place], [place] [address]    Culture  Setup Time   Final     ANAEROBIC BOTTLE ONLY GRAM NEGATIVE RODS CRITICAL RESULT CALLED TO, READ BACK BY AND VERIFIED WITH: Marguerite Shiley [facility] 1204 02/21/24 AT Performed at [facility], [address] [place], [place] [address]    Culture ESCHERICHIA COLI (A)  Final   Report Status PENDING  Incomplete   Organism ID, Bacteria ESCHERICHIA COLI  Final   Organism ID, Bacteria ESCHERICHIA COLI  Final      Susceptibility   Escherichia coli - KIRBY BAUER*    CEFAZOLIN  INTERMEDIATE Intermediate    Escherichia coli - MIC*    AMPICILLIN <=2 SENSITIVE Sensitive     CEFEPIME  <=0.12 SENSITIVE Sensitive     CEFTAZIDIME <=1 SENSITIVE Sensitive     CEFTRIAXONE  <=0.25 SENSITIVE Sensitive     CIPROFLOXACIN <=0.25 SENSITIVE Sensitive     GENTAMICIN <=1 SENSITIVE Sensitive     IMIPENEM <=0.25 SENSITIVE Sensitive     TRIMETH/SULFA <=20 SENSITIVE Sensitive     AMPICILLIN/SULBACTAM <=2 SENSITIVE Sensitive     PIP/TAZO <=4 SENSITIVE Sensitive ug/mL    * ESCHERICHIA COLI    ESCHERICHIA COLI  Blood Culture ID Panel (Reflexed)     Status: Abnormal   Collection Time: 02/20/24  8:58 PM  Result Value Ref Range Status   Enterococcus faecalis NOT DETECTED NOT DETECTED Final   Enterococcus Faecium NOT DETECTED NOT DETECTED Final   Listeria monocytogenes NOT DETECTED NOT DETECTED Final   Staphylococcus species NOT DETECTED NOT DETECTED Final   Staphylococcus aureus (BCID) NOT DETECTED NOT DETECTED Final   Staphylococcus epidermidis NOT DETECTED NOT DETECTED Final   Staphylococcus lugdunensis NOT DETECTED NOT DETECTED Final   Streptococcus species NOT DETECTED NOT DETECTED Final   Streptococcus agalactiae NOT DETECTED NOT DETECTED Final   Streptococcus pneumoniae NOT DETECTED NOT DETECTED Final   Streptococcus pyogenes NOT DETECTED NOT DETECTED Final   A.calcoaceticus-baumannii NOT DETECTED NOT DETECTED Final   Bacteroides fragilis NOT DETECTED NOT DETECTED Final   Enterobacterales DETECTED (A) NOT DETECTED Final     Comment: Enterobacterales represent a large order of gram negative bacteria, not a single organism. CRITICAL RESULT CALLED TO, READ BACK BY AND VERIFIED WITH: Marguerite Shiley [facility] 1204 02/21/24 AT    Enterobacter cloacae complex NOT DETECTED NOT DETECTED Final   Escherichia coli DETECTED (A) NOT DETECTED Final    Comment: CRITICAL RESULT CALLED TO, READ BACK BY AND VERIFIED WITH: Marguerite Shiley PHARMD 1204 02/21/24 AT    Klebsiella aerogenes NOT DETECTED NOT DETECTED Final   Klebsiella oxytoca NOT DETECTED NOT DETECTED Final   Klebsiella pneumoniae NOT DETECTED NOT DETECTED Final   Proteus species NOT DETECTED NOT DETECTED Final   Salmonella species NOT DETECTED NOT DETECTED Final   Serratia marcescens NOT DETECTED NOT DETECTED Final   Haemophilus influenzae NOT DETECTED NOT DETECTED Final   Neisseria meningitidis NOT DETECTED NOT DETECTED Final   Pseudomonas aeruginosa NOT DETECTED NOT DETECTED Final  Stenotrophomonas maltophilia NOT DETECTED NOT DETECTED Final   Candida albicans NOT DETECTED NOT DETECTED Final   Candida auris NOT DETECTED NOT DETECTED Final   Candida glabrata NOT DETECTED NOT DETECTED Final   Candida krusei NOT DETECTED NOT DETECTED Final   Candida parapsilosis NOT DETECTED NOT DETECTED Final   Candida tropicalis NOT DETECTED NOT DETECTED Final   Cryptococcus neoformans/gattii NOT DETECTED NOT DETECTED Final   CTX-M ESBL NOT DETECTED NOT DETECTED Final   Carbapenem resistance IMP NOT DETECTED NOT DETECTED Final   Carbapenem resistance KPC NOT DETECTED NOT DETECTED Final   Carbapenem resistance NDM NOT DETECTED NOT DETECTED Final   Carbapenem resist OXA 48 LIKE NOT DETECTED NOT DETECTED Final   Carbapenem resistance VIM NOT DETECTED NOT DETECTED Final    Comment: Performed at [facility], [address] [place], [place] [address]  Culture, blood (Routine x 2)     Status: None (Preliminary result)   Collection Time: 02/20/24  9:03 PM   Specimen: BLOOD  RIGHT ARM  Result Value Ref Range Status   Specimen Description   Final    BLOOD RIGHT ARM Performed at [facility], [address], [place], [place] [address]    Special Requests   Final    BOTTLES DRAWN AEROBIC AND ANAEROBIC Blood Culture adequate volume Performed at [facility], [address] [place], [place] [address]    Culture  Setup Time   Final    IN BOTH AEROBIC AND ANAEROBIC BOTTLES GRAM NEGATIVE RODS CRITICAL VALUE NOTED.  VALUE IS CONSISTENT WITH PREVIOUSLY REPORTED AND CALLED VALUE. GRAM STAIN REVIEWED-AGREE WITH RESULT drt Performed at [facility], [address], [place], [place] [address]    Culture   Final    Hillis Lu NEGATIVE RODS IDENTIFICATION TO FOLLOW Performed at [facility], [address], [place], [place] [address]    Report Status PENDING  Incomplete  MRSA Next Gen by PCR, Nasal     Status: None   Collection Time: 02/21/24  9:29 AM   Specimen: Nasal Mucosa; Nasal Swab  Result Value Ref Range Status   MRSA by PCR Next Gen NOT DETECTED NOT DETECTED Final    Comment: (NOTE) The GeneXpert MRSA Assay (FDA approved for NASAL specimens only), is one component of a comprehensive MRSA colonization surveillance program. It is not intended to diagnose MRSA infection nor to guide or monitor treatment for MRSA infections. Test performance is not FDA approved in patients less than 41 years old. Performed at [facility], [address] [place], [place] [address]     Labs: CBC: Recent Labs  Lab 02/20/24 2057 02/21/24 0437 02/22/24 0428 02/23/24 0433  WBC 12.7* 11.7* 8.9 4.9  NEUTROABS 10.0*  --   --   --   HGB 13.7 11.5* 12.8* 11.4*  HCT 39.4 33.7* 37.3* 32.5*  MCV 85.5 87.5 86.9 85.5  PLT 111* 84* 79* 73*   Basic Metabolic Panel: Recent Labs  Lab 02/20/24 2057 02/21/24 0437 02/22/24 0428 02/23/24 0433  NA 133* 133* 135 137  K 3.8 3.9 3.9 3.7  CL 101 102 105 103  CO2 20* 23 26 25   GLUCOSE  149* 126* 98 98  BUN 25* 20 23 26*  CREATININE 1.23 1.00 1.42* 1.22  CALCIUM 8.5* 7.9* 8.0* 7.9*   Liver Function Tests: Recent Labs  Lab 02/20/24 2057  AST 29  ALT 15  ALKPHOS 63  BILITOT 1.3*  PROT 7.6  ALBUMIN  3.5   CBG:  No results for input(s): "GLUCAP" in the last 168 hours.  Discharge time spent: greater than 30 minutes.  Signed: Sheril Dines, MD [facility] 02/23/2024

## 2024-02-23 NOTE — TOC Transition Note (Signed)
 Transition of Care [facility]) - Discharge Note   Patient Details  Name: Travis Gallagher MRN: [id number] Date of Birth: 1931/08/29  Transition of Care [facility] CM/SW Contact:  Alexandra Ice, RN Phone Number: 02/23/2024, 11:11 AM   Clinical Narrative:    Brief assessment completed. Patient lives with family, who are able to assist patient if needed. Patient is uninsured and no PCP listed. Open door information listed on AVS. No other TOC needs identified.         Patient Goals and CMS Choice        Expected Discharge Plan and Services           Expected Discharge Date: 02/23/24                                    Prior Living Arrangements/Services                       Activities of Daily Living   ADL Screening (condition at time of admission) Independently performs ADLs?: Yes (appropriate for developmental age) Is the patient deaf or have difficulty hearing?: Yes Does the patient have difficulty seeing, even when wearing glasses/contacts?: Yes Does the patient have difficulty concentrating, remembering, or making decisions?: Yes  Permission Sought/Granted                  Emotional Assessment              Admission diagnosis:  Sepsis due to pneumonia (HCC) [J18.9, A41.9] Sepsis, due to unspecified organism, unspecified whether acute organ dysfunction present [facility]) [A41.9] Patient Active Problem List   Diagnosis Date Noted   E coli bacteremia 02/22/2024   Sepsis due to pneumonia (HCC) 02/21/2024   Constipation 02/21/2024   Aneurysm of right common iliac artery (HCC) 02/21/2024   History of PSVT (paroxysmal supraventricular tachycardia) (HCC)    Abdominal aortic aneurysm (AAA) 30 to 34 mm in diameter (HCC)    Liver cyst 04/05/2021   Protein-calorie malnutrition, severe 03/10/2021   Acute pancreatitis 02/22/2021   Severe sepsis (HCC) 02/22/2021   Cholangitis 02/22/2021   Abdominal pain 02/22/2021   Nausea 02/22/2021    Hypertension    GERD (gastroesophageal reflux disease)    Elevated troponin    PCP:  Pcp, No Pharmacy:   [facility] [address]), [place] - [address] [place]) [place] [address] Phone: [phone] Fax: [phone]     Social Determinants of Health (SDOH) Interventions    Readmission Risk Interventions     No data to display                 Patient Goals and CMS Choice            Discharge Placement                       Discharge Plan and Services Additional resources added to the After Visit Summary for                                       Social Drivers of Health (SDOH) Interventions SDOH Screenings   Food Insecurity: No Food Insecurity (02/21/2024)  Housing: Low Risk  (02/21/2024)  Transportation Needs: No Transportation Needs (02/21/2024)  Utilities: Not At Risk (02/21/2024)  Social Connections: Moderately Isolated (02/21/2024)  Tobacco Use: Low Risk  (02/20/2024)     Readmission Risk Interventions     No data to display

## 2024-02-23 NOTE — Progress Notes (Signed)
 Patient ambulated in hallway x1 with walker. Oxygen saturation maintained between 90% to 95%. No shortness of breath.

## 2024-02-23 NOTE — Progress Notes (Signed)
 Patient discharged home accompanied by family x 4 and all belongings.  A+Ox4. VSS. Medications and discharge instructions reviewed. All questions answered. PIVx2 removed. No bleeding, intact. Left arm swelling improved since this morning. Patient and family verbalized agreement of signs and symptoms of infection. Patient satisfied with overall care at [facility]. Patient to follow up with primary care provider.

## 2024-02-23 NOTE — Plan of Care (Signed)

## 2024-02-25 ENCOUNTER — Other Ambulatory Visit: Payer: Self-pay

## 2024-02-25 ENCOUNTER — Observation Stay
Admission: EM | Admit: 2024-02-25 | Discharge: 2024-02-26 | Disposition: A | Discharge status: Home / Self Care | Payer: MEDICAID | Attending: Internal Medicine | Admitting: Internal Medicine

## 2024-02-25 ENCOUNTER — Encounter: Payer: Self-pay | Admitting: *Deleted

## 2024-02-25 DIAGNOSIS — Z79899 Other long term (current) drug therapy: Secondary | ICD-10-CM | POA: Diagnosis not present

## 2024-02-25 DIAGNOSIS — K5909 Other constipation: Secondary | ICD-10-CM | POA: Diagnosis not present

## 2024-02-25 DIAGNOSIS — R109 Unspecified abdominal pain: Principal | ICD-10-CM | POA: Diagnosis present

## 2024-02-25 DIAGNOSIS — I723 Aneurysm of iliac artery: Secondary | ICD-10-CM | POA: Diagnosis not present

## 2024-02-25 DIAGNOSIS — Z7982 Long term (current) use of aspirin: Secondary | ICD-10-CM | POA: Insufficient documentation

## 2024-02-25 DIAGNOSIS — I714 Abdominal aortic aneurysm, without rupture, unspecified: Secondary | ICD-10-CM

## 2024-02-25 DIAGNOSIS — I10 Essential (primary) hypertension: Secondary | ICD-10-CM | POA: Diagnosis not present

## 2024-02-25 LAB — COMPREHENSIVE METABOLIC PANEL WITH GFR
ALT: 24 U/L (ref 0–44)
AST: 34 U/L (ref 15–41)
Albumin: 3 g/dL — ABNORMAL LOW (ref 3.5–5.0)
Alkaline Phosphatase: 60 U/L (ref 38–126)
Anion gap: 6 (ref 5–15)
BUN: 16 mg/dL (ref 8–23)
CO2: 25 mmol/L (ref 22–32)
Calcium: 8.1 mg/dL — ABNORMAL LOW (ref 8.9–10.3)
Chloride: 108 mmol/L (ref 98–111)
Creatinine, Ser: 0.97 mg/dL (ref 0.61–1.24)
GFR, Estimated: >60 mL/min (ref >60)
Glucose, Bld: 107 mg/dL — ABNORMAL HIGH (ref 70–99)
Potassium: 3.5 mmol/L (ref 3.5–5.1)
Sodium: 139 mmol/L (ref 135–145)
Total Bilirubin: 0.6 mg/dL (ref 0.0–1.2)
Total Protein: 7 g/dL (ref 6.5–8.1)

## 2024-02-25 LAB — URINALYSIS, ROUTINE W REFLEX MICROSCOPIC
Bacteria, UA: NONE SEEN
Bilirubin Urine: NEGATIVE
Glucose, UA: NEGATIVE mg/dL
Ketones, ur: NEGATIVE mg/dL
Leukocytes,Ua: NEGATIVE
Nitrite: NEGATIVE
Protein, ur: 100 mg/dL — AB
Specific Gravity, Urine: 1.005 (ref 1.005–1.030)
pH: 7 (ref 5.0–8.0)

## 2024-02-25 LAB — CBC
HCT: 38.3 % — ABNORMAL LOW (ref 39.0–52.0)
Hemoglobin: 12.9 g/dL — ABNORMAL LOW (ref 13.0–17.0)
MCH: 29.7 pg (ref 26.0–34.0)
MCHC: 33.7 g/dL (ref 30.0–36.0)
MCV: 88.2 fL (ref 80.0–100.0)
Platelets: 134 10*3/uL — ABNORMAL LOW (ref 150–400)
RBC: 4.34 MIL/uL (ref 4.22–5.81)
RDW: 12.7 % (ref 11.5–15.5)
WBC: 7.1 10*3/uL (ref 4.0–10.5)
nRBC: 0 % (ref 0.0–0.2)

## 2024-02-25 LAB — LIPASE, BLOOD: Lipase: 50 U/L (ref 11–51)

## 2024-02-25 LAB — TROPONIN I (HIGH SENSITIVITY): Troponin I (High Sensitivity): 42 ng/L — ABNORMAL HIGH (ref <18)

## 2024-02-25 NOTE — ED Triage Notes (Signed)
 EMS brings pt in from home for c/o rt sided abd pain

## 2024-02-25 NOTE — ED Provider Notes (Signed)
 [facility] Provider Note    Event Date/Time   First MD Initiated Contact with Patient 02/25/24 2329     (approximate)   History   Abdominal Pain   HPI  HPI obtained via video interpreter  Travis Gallagher is a 89 y.o. male with history of hypertension, AAA, and choledocholithiasis status post cholecystectomy, and status post recent admission for pneumonia presents with right lower quadrant abdominal pain since earlier today.  He has had some associated nausea but no vomiting.  He has had mild diarrhea.  He also reports that the pain is radiating up into the upper abdomen and chest on the right side.  He denies any fevers.  I reviewed the past medical records.  The patient was just admitted to the hospital service from 4/24 through 4/27 with sepsis secondary to community-acquired pneumonia.  He was discharged on amoxicillin.   Physical Exam   Triage Vital Signs: ED Triage Vitals  Encounter Vitals Group     BP 02/25/24 2221 (!) 197/110     Systolic BP Percentile --      Diastolic BP Percentile --      Pulse Rate 02/25/24 2221 96     Resp 02/25/24 2221 17     Temp 02/25/24 2221 98.3 F (36.8 C)     Temp Source 02/25/24 2221 Oral     SpO2 02/25/24 2221 95 %     Weight 02/25/24 2109 136 lb 11 oz (62 kg)     Height 02/25/24 2109 5\' 6"  (1.676 m)     Head Circumference --      Peak Flow --      Pain Score 02/25/24 2109 5     Pain Loc --      Pain Education --      Exclude from Growth Chart --     Most recent vital signs: Vitals:   02/26/24 0130 02/26/24 0211  BP: (!) 148/59   Pulse: 81   Resp: 17   Temp:  98.3 F (36.8 C)  SpO2: 92%      General: Alert, no distress.  CV:  Good peripheral perfusion.  Resp:  Normal effort.  Abd:  Soft with mild right lower quadrant tenderness.  No distention.  Other:  No jaundice or scleral icterus.   ED Results / Procedures / Treatments   Labs (all labs ordered are listed, but only  abnormal results are displayed) Labs Reviewed  COMPREHENSIVE METABOLIC PANEL WITH GFR - Abnormal; Notable for the following components:      Result Value   Glucose, Bld 107 (*)    Calcium 8.1 (*)    Albumin  3.0 (*)    All other components within normal limits  CBC - Abnormal; Notable for the following components:   Hemoglobin 12.9 (*)    HCT 38.3 (*)    Platelets 134 (*)    All other components within normal limits  URINALYSIS, ROUTINE W REFLEX MICROSCOPIC - Abnormal; Notable for the following components:   Color, Urine STRAW (*)    APPearance CLEAR (*)    Hgb urine dipstick SMALL (*)    Protein, ur 100 (*)    All other components within normal limits  TROPONIN I (HIGH SENSITIVITY) - Abnormal; Notable for the following components:   Troponin I (High Sensitivity) 42 (*)    All other components within normal limits  TROPONIN I (HIGH SENSITIVITY) - Abnormal; Notable for the following components:   Troponin I (High Sensitivity) 41 (*)  All other components within normal limits  LIPASE, BLOOD  LACTIC ACID, PLASMA     EKG  ED ECG REPORT I, Lind Repine, the attending physician, personally viewed and interpreted this ECG.  Date: 02/25/2024 EKG Time: 2115 Rate: 89 Rhythm: normal sinus rhythm QRS Axis: normal Intervals: Nonspecific IVCD ST/T Wave abnormalities: Nonspecific ST abnormalities Narrative Interpretation: no evidence of acute ischemia    RADIOLOGY  CT abdomen/pelvis: I independently viewed and interpreted the images; there are no dilated bowel or any free air or free fluid.  Radiology report indicates the following:  IMPRESSION:  1.  No acute intra-abdominal or intrapelvic abnormality.  2. Aortic Atherosclerosis (ICD10-I70.0).  3. Stable infrarenal abdominal aorta aneurysm (3.3 cm). Stable right  common iliac artery aneurysm (3.7 cm). Recommend follow-up  ultrasound every 3 years. (Ref.: J Vasc Surg. 2018; 67:2-77 and J Am  Coll Radiol  2013;10(10):789-794.). Aortic aneurysm NOS  (ICD10-I71.9).    Chest x-ray: Small pleural effusion  PROCEDURES:  Critical Care performed: No  Procedures   MEDICATIONS ORDERED IN ED: Medications  famotidine  (PEPCID ) IVPB 20 mg premix (20 mg Intravenous New Bag/Given 02/26/24 0213)  ondansetron  (ZOFRAN ) injection 4 mg (4 mg Intravenous Given 02/26/24 0010)  morphine  (PF) 4 MG/ML injection 4 mg (4 mg Intravenous Given 02/26/24 0010)  iohexol  (OMNIPAQUE ) 300 MG/ML solution 100 mL (100 mLs Intravenous Contrast Given 02/26/24 0018)     IMPRESSION / MDM / ASSESSMENT AND PLAN / ED COURSE  I reviewed the triage vital signs and the nursing notes.  89 year old male with PMH as noted above, just admitted for pneumonia/sepsis now presents with new onset right lower quadrant abdominal pain with some nausea and loose stools; the pain radiates up to the right side of the chest.  On exam the patient is overall comfortable appearing.  He is hypertensive with otherwise normal vital signs.  There is mild tenderness in the right lower quadrant.  Differential diagnosis includes, but is not limited to, colitis, diverticulitis, appendicitis, mesenteric adenitis, musculoskeletal pain, less likely cardiac etiology.  Given the fairly localized pain and the presence of GI symptoms, this is not consistent with AAA complication or other vascular etiology.  We will obtain lab workup, CT, and reassess.  Patient's presentation is most consistent with acute presentation with potential threat to life or bodily function.  The patient is on the cardiac monitor to evaluate for evidence of arrhythmia and/or significant heart rate changes.   ----------------------------------------- 2:20 AM on 02/26/2024 -----------------------------------------  Workup is overall reassuring.  CT abdomen shows no acute findings.  Chest x-ray shows a small left pleural effusion.  CMP is unremarkable.  CBC shows no leukocytosis.  Troponins  are minimally elevated but consistent with prior.  This appears to be the patient's baseline.  Lactate is normal.  On reassessment the symptoms have somewhat improved although the patient is continued to complain of pain which is now migrating more into the upper abdomen.  Overall I suspect gastritis or other benign etiology.  However given his age and comorbidities and the continued active pain, I think he would benefit from admission for further monitoring.  I consulted Dr. Vallarie Gauze from the hospitalist service; based on our discussion she agrees to evaluate the patient for admission.   FINAL CLINICAL IMPRESSION(S) / ED DIAGNOSES   Final diagnoses:  Abdominal pain, unspecified abdominal location     Rx / DC Orders   ED Discharge Orders     None        Note:  This  document was prepared using Conservation officer, historic buildings and may include unintentional dictation errors.    Lind Repine, MD 02/26/24 [phone]

## 2024-02-25 NOTE — ED Triage Notes (Addendum)
 Pt brought in via ems from home.  Interpreter on a stick used in triage.   Pt in a wheelchair.  Iv in place.  Pt has right lower abd pain.  No v/d. Sx began last night.    Pt denies chest pain or sob.   Pt alert.

## 2024-02-26 ENCOUNTER — Emergency Department: Payer: MEDICAID

## 2024-02-26 DIAGNOSIS — R1013 Epigastric pain: Secondary | ICD-10-CM

## 2024-02-26 DIAGNOSIS — R109 Unspecified abdominal pain: Secondary | ICD-10-CM

## 2024-02-26 LAB — CULTURE, BLOOD (ROUTINE X 2): Special Requests: ADEQUATE

## 2024-02-26 LAB — LACTIC ACID, PLASMA: Lactic Acid, Venous: 0.9 mmol/L (ref 0.5–1.9)

## 2024-02-26 LAB — TROPONIN I (HIGH SENSITIVITY): Troponin I (High Sensitivity): 41 ng/L — ABNORMAL HIGH (ref <18)

## 2024-02-26 MED ORDER — ONDANSETRON HCL 4 MG/2ML IJ SOLN
4.0000 mg | Freq: Once | INTRAMUSCULAR | Status: AC
  Administered 2024-02-26: 4 mg via INTRAVENOUS
  Filled 2024-02-26: qty 2

## 2024-02-26 MED ORDER — ACETAMINOPHEN 650 MG RE SUPP
650.0000 mg | Freq: Four times a day (QID) | RECTAL | Status: DC | PRN
Start: 2024-02-26 — End: 2024-02-26

## 2024-02-26 MED ORDER — MORPHINE SULFATE (PF) 4 MG/ML IV SOLN
4.0000 mg | Freq: Once | INTRAVENOUS | Status: AC
  Administered 2024-02-26: 4 mg via INTRAVENOUS
  Filled 2024-02-26: qty 1

## 2024-02-26 MED ORDER — ENSURE ENLIVE PO LIQD
237.0000 mL | Freq: Three times a day (TID) | ORAL | Status: DC
  Administered 2024-02-26: 237 mL via ORAL

## 2024-02-26 MED ORDER — ENOXAPARIN SODIUM 40 MG/0.4ML IJ SOSY
40.0000 mg | PREFILLED_SYRINGE | INTRAMUSCULAR | Status: DC
Start: 2024-02-26 — End: 2024-02-26
  Administered 2024-02-26: 40 mg via SUBCUTANEOUS
  Filled 2024-02-26: qty 0.4

## 2024-02-26 MED ORDER — PANTOPRAZOLE SODIUM 40 MG PO TBEC: 40.0000 mg | DELAYED_RELEASE_TABLET | Freq: Every day | ORAL | 1 refills | Status: AC

## 2024-02-26 MED ORDER — FAMOTIDINE IN NACL 20-0.9 MG/50ML-% IV SOLN
20.0000 mg | Freq: Once | INTRAVENOUS | Status: AC
  Administered 2024-02-26: 20 mg via INTRAVENOUS
  Filled 2024-02-26: qty 50

## 2024-02-26 MED ORDER — IOHEXOL 300 MG/ML  SOLN
100.0000 mL | Freq: Once | INTRAMUSCULAR | Status: AC | PRN
  Administered 2024-02-26: 100 mL via INTRAVENOUS

## 2024-02-26 MED ORDER — SENNA 8.6 MG PO TABS
1.0000 | ORAL_TABLET | Freq: Two times a day (BID) | ORAL | Status: DC
Start: 2024-02-26 — End: 2024-02-26
  Administered 2024-02-26: 8.6 mg via ORAL
  Filled 2024-02-26: qty 1

## 2024-02-26 MED ORDER — BISACODYL 10 MG RE SUPP: 10.0000 mg | RECTAL | Status: DC | PRN

## 2024-02-26 MED ORDER — MORPHINE SULFATE (PF) 2 MG/ML IV SOLN: 2.0000 mg | INTRAVENOUS | Status: DC | PRN

## 2024-02-26 MED ORDER — ENALAPRIL MALEATE 10 MG PO TABS
20.0000 mg | ORAL_TABLET | Freq: Every day | ORAL | Status: DC
  Administered 2024-02-26: 20 mg via ORAL
  Filled 2024-02-26: qty 2

## 2024-02-26 MED ORDER — POLYETHYLENE GLYCOL 3350 17 G PO PACK: 17.0000 g | PACK | Freq: Every day | ORAL | Status: DC | PRN

## 2024-02-26 MED ORDER — ONDANSETRON HCL 4 MG/2ML IJ SOLN: 4.0000 mg | Freq: Four times a day (QID) | INTRAMUSCULAR | Status: DC | PRN

## 2024-02-26 MED ORDER — ONDANSETRON HCL 4 MG PO TABS: 4.0000 mg | ORAL_TABLET | Freq: Four times a day (QID) | ORAL | Status: DC | PRN

## 2024-02-26 MED ORDER — ACETAMINOPHEN 325 MG PO TABS: 650.0000 mg | ORAL_TABLET | Freq: Four times a day (QID) | ORAL | Status: DC | PRN

## 2024-02-26 NOTE — Discharge Summary (Signed)
 Physician Discharge Summary   Patient: Travis Gallagher MRN: [id number] DOB: 02-01-1931  Admit date:     02/25/2024  Discharge date: 02/26/24  Discharge Physician: Jodeane Mulligan   PCP: Pcp, No   Recommendations at discharge:    Pt to be discharged home.   If you experience worsening fever, chills, chest pain, shortness of breath, or other concerning symptoms, please call your PCP or go to the emergency department immediately.  Discharge Diagnoses: Principal Problem:   Abdominal pain  Resolved Problems:   * No resolved hospital problems. *   Hospital Course: Anita Bocook Nanine Babcock is a 89 y.o. male with medical history significant for Hypertension, AAA, choledocholithiasis s/p ERCP complicated by biliary leak, and subsequent cholecystectomy, recently admitted from 4/25 to 02/23/2024 with suspected sepsis secondary to pneumonia, being admitted with diffuse abdominal pain of uncertain etiology.  Patient presented with a several hour history of right lower quadrant pain radiating to his chest into his left side.  He had no vomiting, diarrhea, constipation, dysuria, fever or chills. ED course and data review:vitals unremarkable  Labs unrevealing, EKG non acute, CT abd and pelvis stable and non acute Treated with morphine , zofran  and pepcid  but continues to have discomfort   Due to patient's age obs requested    Assessment and Plan: Abdominal pain -CT abdomen negative for acute pathology.  No leukocytosis, fevers, nausea, vomiting.  Tolerated diet.  Patient's abdominal pain mostly located in diaphragm type region.  Appears to be improved from presentation.  Will give empiric Protonix .   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  Will provide utpatient referral at discharge.   Hypertension Continue carvedilol         Consultants: None Procedures performed: None Disposition: Home Diet recommendation:  Discharge Diet Orders (From admission, onward)     Start     Ordered   02/26/24 0000  Diet - low sodium heart healthy        02/26/24 1155           Cardiac diet  DISCHARGE MEDICATION: Allergies as of 02/26/2024       Reactions   Quinolones    Ascending aorta and abdominal aneurysm -avoid use        Medication List     TAKE these medications    amoxicillin 500 MG capsule Commonly known as: AMOXIL Take 2 capsules (1,000 mg total) by mouth 3 (three) times daily for 3 days.   aspirin EC 81 MG tablet Take 81 mg by mouth as needed (pain).   bisacodyl  10 MG suppository Commonly known as: DULCOLAX Place 10 mg rectally as needed for moderate constipation.   bisoprolol 5 MG tablet Commonly known as: ZEBETA Take 2.5 mg by mouth daily as needed.   enalapril 20 MG tablet Commonly known as: VASOTEC Take 1 tablet (20 mg total) by mouth daily.   feeding supplement Liqd Take 237 mLs by mouth 3 (three) times daily between meals.   pantoprazole  40 MG tablet Commonly known as: Protonix  Take 1 tablet (40 mg total) by mouth daily.         Discharge Exam: Filed Weights   02/25/24 2109  Weight: 62 kg    GENERAL:  Alert, pleasant, no acute distress  HEENT:  EOMI CARDIOVASCULAR:  RRR, no murmurs appreciated RESPIRATORY:  Clear to auscultation, no wheezing, rales, or rhonchi GASTROINTESTINAL:  Soft, nontender, nondistended EXTREMITIES:  No LE edema bilaterally NEURO:  No new focal deficits appreciated SKIN:  No rashes noted PSYCH:  Appropriate mood and affect    Condition at discharge: improving  The results of significant diagnostics from this hospitalization (including imaging,  microbiology, ancillary and laboratory) are listed below for reference.   Imaging Studies: DG Chest 2 View Result Date: 02/26/2024 CLINICAL DATA:  R sided chest pain EXAM: CHEST - 2 VIEW COMPARISON:  None Available. FINDINGS: The heart and mediastinal contours are unchanged. Atherosclerotic plaques. Elevated right hemidiaphragm. No focal consolidation. No pulmonary edema. Trace to small left pleural effusion. No pneumothorax. No acute osseous abnormality. IMPRESSION: 1. Trace to small left pleural effusion. 2.  Aortic Atherosclerosis (ICD10-I70.0). Electronically Signed   By: Morgane  Naveau M.D.   On: 02/26/2024 01:04   CT ABDOMEN PELVIS W CONTRAST Result Date: 02/26/2024 CLINICAL DATA:  RLQ abdominal pain EXAM: CT ABDOMEN AND PELVIS WITH CONTRAST TECHNIQUE: Multidetector CT imaging of the abdomen and pelvis was performed using the standard protocol following bolus administration of intravenous contrast. RADIATION DOSE REDUCTION: This exam was performed according to the departmental dose-optimization program which includes automated exposure control, adjustment of the mA and/or kV according to patient size and/or use of iterative reconstruction technique. CONTRAST:  OMNIPAQUE  IOHEXOL  300 MG/ML  SOLN COMPARISON:  CT abdomen pelvis 02/20/2024 FINDINGS: Lower chest: Bilateral lower lobe atelectasis. Coronary artery calcification. No acute abnormality. Possible tiny hiatal hernia. Hepatobiliary: Numeral fluid density lesions within the liver is consistent with hepatic cysts. Status post hysterectomy. No biliary dilatation. Pancreas: No focal lesion. Normal pancreatic contour. No surrounding inflammatory changes. No main pancreatic ductal dilatation. Spleen: Normal in size. Fluid density lesions within the spleen likely cysts. Adrenals/Urinary Tract: No adrenal nodule bilaterally. Bilateral kidneys enhance symmetrically. No hydronephrosis. No hydroureter. The urinary bladder is unremarkable. On delayed  imaging, there is no urothelial wall thickening and there are no filling defects in the opacified portions of the bilateral collecting systems or ureters. Stomach/Bowel: Stomach is within normal limits. No evidence of bowel wall thickening or dilatation. Appendix appears normal. Vascular/Lymphatic: Stable infrarenal abdominal aorta aneurysm (3.3 cm). Stable right common iliac artery aneurysm (3.7 cm). Severe atherosclerotic plaque of the aorta and its branches. No abdominal, pelvic, or inguinal lymphadenopathy. Reproductive: Prostate is unremarkable. Other: No intraperitoneal free fluid. No intraperitoneal free gas. No organized fluid collection. Musculoskeletal: No abdominal wall hernia or abnormality. No suspicious lytic or blastic osseous lesions. No acute displaced fracture. Multilevel degenerative changes of the spine. IMPRESSION: 1.  No acute intra-abdominal or intrapelvic abnormality. 2. Aortic Atherosclerosis (ICD10-I70.0). 3. Stable infrarenal abdominal aorta aneurysm (3.3 cm). Stable right common iliac artery aneurysm (3.7 cm). Recommend follow-up ultrasound every 3 years. (Ref.: J Vasc Surg. 2018; 67:2-77 and J Am Coll Radiol 2013;10(10):789-794.). Aortic aneurysm NOS (ICD10-I71.9). Electronically Signed   By: Morgane  Naveau M.D.   On: 02/26/2024 00:45   CT ABDOMEN PELVIS W CONTRAST Result Date: 02/20/2024 CLINICAL DATA:  Abdominal pain with nausea and vomiting. History of cholecystectomy with duodenal perforation repair. EXAM: CT ABDOMEN AND PELVIS WITH CONTRAST TECHNIQUE: Multidetector CT imaging of the abdomen and pelvis was performed using the standard protocol following bolus administration of intravenous contrast. RADIATION DOSE REDUCTION: This exam was performed according to the departmental dose-optimization program which includes automated exposure control, adjustment of the mA  and/or kV according to patient size and/or use of iterative reconstruction technique. CONTRAST:  OMNIPAQUE   IOHEXOL  300 MG/ML  SOLN COMPARISON:  CT abdomen and pelvis 03/07/2021 FINDINGS: Lower chest: There are minimal ground-glass opacities in the lung bases. Pleural effusions are no longer seen. Hepatobiliary: There are numerous hepatic cysts similar to the prior study measuring up to 5.2 cm. The gallbladder is surgically absent. There is no biliary ductal dilatation. Pancreas: Unremarkable. No pancreatic ductal dilatation or surrounding inflammatory changes. Spleen: Lobulated multiloculated cystic area seen in the spleen measuring 3.7 x 3.7 by 3.3 cm, mildly increased in size compared to 2022. Spleen is normal in size. Additional smaller splenic cysts are new from prior. Adrenals/Urinary Tract: There are rounded hypodensities in both kidneys which are too small to characterize, likely cysts. There is no hydronephrosis or perinephric fluid. The adrenal glands and bladder are within normal limits. Stomach/Bowel: There is a small hiatal hernia. Stomach is within normal limits. Appendix appears normal. No evidence of bowel wall thickening, distention, or inflammatory changes. Vascular/Lymphatic: Abdominal aortic aneurysm measures 3.3 cm similar to the prior study. Right common iliac artery aneurysm measures up to 3.7 cm (previously 3.0 cm). No enlarged lymph nodes are identified. Reproductive: Uterus and bilateral adnexa are unremarkable. Other: No abdominal wall hernia or abnormality. No abdominopelvic ascites. Musculoskeletal: No fracture is seen. IMPRESSION: 1. No acute localizing process in the abdomen or pelvis. 2. Stable 3.3 cm abdominal aortic aneurysm. Recommend follow-up ultrasound every 3 years. 3. 3.7 cm right common iliac artery aneurysm (previously 3.0 cm). Recommend referral to a vascular specialist. 4. Stable hepatic cysts. 5. Multiloculated cystic lesion in the spleen, mildly increased in size compared to 2022. Additional smaller splenic cysts are new from prior. Findings are favored as benign. 6. Minimal  ground-glass opacities in the lung bases, possibly atelectasis. 7. Small hiatal hernia. 8. Aortic atherosclerosis. Aortic Atherosclerosis (ICD10-I70.0). Electronically Signed   By: Tyron Gallon M.D.   On: 02/20/2024 22:58   DG Chest Port 1 View Result Date: 02/20/2024 CLINICAL DATA:  Sepsis EXAM: PORTABLE CHEST 1 VIEW COMPARISON:  Chest x-ray 03/05/2021 FINDINGS: There are minimal patchy retrocardiac opacities. The lungs are otherwise clear. There is no pleural effusion or pneumothorax. The cardiomediastinal silhouette is within normal limits. No acute fractures are seen. IMPRESSION: Minimal patchy retrocardiac opacities, atelectasis versus pneumonia. Electronically Signed   By: Tyron Gallon M.D.   On: 02/20/2024 22:12    Microbiology: Results for orders placed or performed during the hospital encounter of 02/20/24  Resp panel by RT-PCR (RSV, Flu A&B, Covid) Anterior Nasal Swab     Status: None   Collection Time: 02/20/24  8:57 PM   Specimen: Anterior Nasal Swab  Result Value Ref Range Status   SARS Coronavirus 2 by RT PCR NEGATIVE NEGATIVE Final    Comment: (NOTE) SARS-CoV-2 target nucleic acids are NOT DETECTED.  The SARS-CoV-2 RNA is generally detectable in upper respiratory specimens during the acute phase of infection. The lowest concentration of SARS-CoV-2 viral copies this assay can detect is 138 copies/mL. A negative result does not preclude SARS-Cov-2 infection and should not be used as the sole basis for treatment or other patient management decisions. A negative result may occur with  improper specimen collection/handling, submission of specimen other than nasopharyngeal swab, presence of viral mutation(s) within the areas targeted by this assay, and inadequate number of viral copies(<138 copies/mL). A negative result must be combined with clinical observations, patient history, and epidemiological information. The expected result is  Negative.  Fact Sheet for Patients:   BloggerCourse.com  Fact Sheet for Healthcare Providers:  SeriousBroker.it  This test is no t yet approved or cleared by the [place]  FDA and  has been authorized for detection and/or diagnosis of SARS-CoV-2 by FDA under an Emergency Use Authorization (EUA). This EUA will remain  in effect (meaning this test can be used) for the duration of the COVID-19 declaration under Section 564(b)(1) of the Act, 21 U.S.C.section 360bbb-3(b)(1), unless the authorization is terminated  or revoked sooner.       Influenza A by PCR NEGATIVE NEGATIVE Final   Influenza B by PCR NEGATIVE NEGATIVE Final    Comment: (NOTE) The Xpert Xpress SARS-CoV-2/FLU/RSV plus assay is intended as an aid in the diagnosis of influenza from Nasopharyngeal swab specimens and should not be used as a sole basis for treatment. Nasal washings and aspirates are unacceptable for Xpert Xpress SARS-CoV-2/FLU/RSV testing.  Fact Sheet for Patients: BloggerCourse.com  Fact Sheet for Healthcare Providers: SeriousBroker.it  This test is not yet approved or cleared by the [place]  FDA and has been authorized for detection and/or diagnosis of SARS-CoV-2 by FDA under an Emergency Use Authorization (EUA). This EUA will remain in effect (meaning this test can be used) for the duration of the COVID-19 declaration under Section 564(b)(1) of the Act, 21 U.S.C. section 360bbb-3(b)(1), unless the authorization is terminated or revoked.     Resp Syncytial Virus by PCR NEGATIVE NEGATIVE Final    Comment: (NOTE) Fact Sheet for Patients: BloggerCourse.com  Fact Sheet for Healthcare Providers: SeriousBroker.it  This test is not yet approved or cleared by the [place]  FDA and has been authorized for detection and/or diagnosis of SARS-CoV-2 by FDA under an Emergency Use  Authorization (EUA). This EUA will remain in effect (meaning this test can be used) for the duration of the COVID-19 declaration under Section 564(b)(1) of the Act, 21 U.S.C. section 360bbb-3(b)(1), unless the authorization is terminated or revoked.  Performed at [facility], [address] [place], [place] [address]   Culture, blood (Routine x 2)     Status: Abnormal   Collection Time: 02/20/24  8:58 PM   Specimen: BLOOD LEFT ARM  Result Value Ref Range Status   Specimen Description   Final    BLOOD LEFT ARM Performed at [facility], [address], [place], [place] [address]    Special Requests   Final    BOTTLES DRAWN AEROBIC AND ANAEROBIC Blood Culture results may not be optimal due to an inadequate volume of blood received in culture bottles Performed at [facility], [address] [place], [place] [address]    Culture  Setup Time   Final    ANAEROBIC BOTTLE ONLY GRAM NEGATIVE RODS CRITICAL RESULT CALLED TO, READ BACK BY AND VERIFIED WITH: Marguerite Shiley [facility] 1204 02/21/24 AT Performed at [facility], [address], [place], [place] [address]    Culture ESCHERICHIA COLI (A)  Final   Report Status 02/23/2024 FINAL  Final   Organism ID, Bacteria ESCHERICHIA COLI  Final   Organism ID, Bacteria ESCHERICHIA COLI  Final      Susceptibility   Escherichia coli - KIRBY BAUER*    CEFAZOLIN  INTERMEDIATE Intermediate    Escherichia coli - MIC*    AMPICILLIN <=2 SENSITIVE Sensitive     CEFEPIME  <=0.12 SENSITIVE Sensitive     CEFTAZIDIME <=1 SENSITIVE Sensitive     CEFTRIAXONE  <=0.25 SENSITIVE Sensitive     CIPROFLOXACIN <=0.25  SENSITIVE Sensitive     GENTAMICIN <=1 SENSITIVE Sensitive     IMIPENEM <=0.25 SENSITIVE Sensitive     TRIMETH/SULFA <=20 SENSITIVE Sensitive     AMPICILLIN/SULBACTAM <=2 SENSITIVE Sensitive     PIP/TAZO <=4 SENSITIVE Sensitive ug/mL    * ESCHERICHIA COLI    ESCHERICHIA COLI  Blood Culture ID Panel (Reflexed)      Status: Abnormal   Collection Time: 02/20/24  8:58 PM  Result Value Ref Range Status   Enterococcus faecalis NOT DETECTED NOT DETECTED Final   Enterococcus Faecium NOT DETECTED NOT DETECTED Final   Listeria monocytogenes NOT DETECTED NOT DETECTED Final   Staphylococcus species NOT DETECTED NOT DETECTED Final   Staphylococcus aureus (BCID) NOT DETECTED NOT DETECTED Final   Staphylococcus epidermidis NOT DETECTED NOT DETECTED Final   Staphylococcus lugdunensis NOT DETECTED NOT DETECTED Final   Streptococcus species NOT DETECTED NOT DETECTED Final   Streptococcus agalactiae NOT DETECTED NOT DETECTED Final   Streptococcus pneumoniae NOT DETECTED NOT DETECTED Final   Streptococcus pyogenes NOT DETECTED NOT DETECTED Final   A.calcoaceticus-baumannii NOT DETECTED NOT DETECTED Final   Bacteroides fragilis NOT DETECTED NOT DETECTED Final   Enterobacterales DETECTED (A) NOT DETECTED Final    Comment: Enterobacterales represent a large order of gram negative bacteria, not a single organism. CRITICAL RESULT CALLED TO, READ BACK BY AND VERIFIED WITH: Marguerite Shiley PHARMD 1204 02/21/24 AT    Enterobacter cloacae complex NOT DETECTED NOT DETECTED Final   Escherichia coli DETECTED (A) NOT DETECTED Final    Comment: CRITICAL RESULT CALLED TO, READ BACK BY AND VERIFIED WITH: Marguerite Shiley PHARMD 1204 02/21/24 AT    Klebsiella aerogenes NOT DETECTED NOT DETECTED Final   Klebsiella oxytoca NOT DETECTED NOT DETECTED Final   Klebsiella pneumoniae NOT DETECTED NOT DETECTED Final   Proteus species NOT DETECTED NOT DETECTED Final   Salmonella species NOT DETECTED NOT DETECTED Final   Serratia marcescens NOT DETECTED NOT DETECTED Final   Haemophilus influenzae NOT DETECTED NOT DETECTED Final   Neisseria meningitidis NOT DETECTED NOT DETECTED Final   Pseudomonas aeruginosa NOT DETECTED NOT DETECTED Final   Stenotrophomonas maltophilia NOT DETECTED NOT DETECTED Final   Candida albicans NOT DETECTED NOT  DETECTED Final   Candida auris NOT DETECTED NOT DETECTED Final   Candida glabrata NOT DETECTED NOT DETECTED Final   Candida krusei NOT DETECTED NOT DETECTED Final   Candida parapsilosis NOT DETECTED NOT DETECTED Final   Candida tropicalis NOT DETECTED NOT DETECTED Final   Cryptococcus neoformans/gattii NOT DETECTED NOT DETECTED Final   CTX-M ESBL NOT DETECTED NOT DETECTED Final   Carbapenem resistance IMP NOT DETECTED NOT DETECTED Final   Carbapenem resistance KPC NOT DETECTED NOT DETECTED Final   Carbapenem resistance NDM NOT DETECTED NOT DETECTED Final   Carbapenem resist OXA 48 LIKE NOT DETECTED NOT DETECTED Final   Carbapenem resistance VIM NOT DETECTED NOT DETECTED Final    Comment: Performed at [facility], [address] [place], [place] [address]  Culture, blood (Routine x 2)     Status: Abnormal (Preliminary result)   Collection Time: 02/20/24  9:03 PM   Specimen: BLOOD RIGHT ARM  Result Value Ref Range Status   Specimen Description   Final    BLOOD RIGHT ARM Performed at [facility], [address], [place], [place] [address]    Special Requests   Final    BOTTLES DRAWN AEROBIC AND ANAEROBIC Blood Culture adequate volume Performed at [facility], [address] [place],  [place] [address]    Culture  Setup Time   Final    IN BOTH AEROBIC AND ANAEROBIC BOTTLES GRAM NEGATIVE RODS CRITICAL VALUE NOTED.  VALUE IS CONSISTENT WITH PREVIOUSLY REPORTED AND CALLED VALUE. GRAM STAIN REVIEWED-AGREE WITH RESULT drt Performed at [facility], [address] [place], [place] [address]    Culture (A)  Final    ESCHERICHIA COLI SUSCEPTIBILITIES PERFORMED ON PREVIOUS CULTURE WITHIN THE LAST 5 DAYS. Performed at [facility], [address] [place], [place] [address]    Report Status PENDING  Incomplete  MRSA Next Gen by PCR, Nasal     Status: None   Collection Time: 02/21/24  9:29 AM   Specimen: Nasal Mucosa; Nasal Swab  Result  Value Ref Range Status   MRSA by PCR Next Gen NOT DETECTED NOT DETECTED Final    Comment: (NOTE) The GeneXpert MRSA Assay (FDA approved for NASAL specimens only), is one component of a comprehensive MRSA colonization surveillance program. It is not intended to diagnose MRSA infection nor to guide or monitor treatment for MRSA infections. Test performance is not FDA approved in patients less than 30 years old. Performed at [facility], [address] [place], [place] [address]     Labs: CBC: Recent Labs  Lab 02/20/24 [phone] 02/21/24 0437 02/22/24 0428 02/23/24 0433 02/25/24 2112  WBC 12.7* 11.7* 8.9 4.9 7.1  NEUTROABS 10.0*  --   --   --   --   HGB 13.7 11.5* 12.8* 11.4* 12.9*  HCT 39.4 33.7* 37.3* 32.5* 38.3*  MCV 85.5 87.5 86.9 85.5 88.2  PLT 111* 84* 79* 73* 134*   Basic Metabolic Panel: Recent Labs  Lab 02/20/24 2057 02/21/24 0437 02/22/24 0428 02/23/24 0433 02/25/24 2112  NA 133* 133* 135 137 139  K 3.8 3.9 3.9 3.7 3.5  CL 101 102 105 103 108  CO2 20* 23 26 25 25   GLUCOSE 149* 126* 98 98 107*  BUN 25* 20 23 26* 16  CREATININE 1.23 1.00 1.42* 1.22 0.97  CALCIUM 8.5* 7.9* 8.0* 7.9* 8.1*   Liver Function Tests: Recent Labs  Lab 02/20/24 2057 02/25/24 2112  AST 29 34  ALT 15 24  ALKPHOS 63 60  BILITOT 1.3* 0.6  PROT 7.6 7.0  ALBUMIN  3.5 3.0*   CBG: No results for input(s): "GLUCAP" in the last 168 hours.  Discharge time spent: 25 minutes.  Signed: Jodeane Mulligan, DO [facility] 02/26/2024

## 2024-02-26 NOTE — ED Notes (Signed)
Pt provided w/ apple juice per request.

## 2024-02-26 NOTE — H&P (Signed)
 History and Physical    Patient: Travis Gallagher:[id number] DOB: [birth year] DOA: 02/25/2024 DOS: the patient was seen and examined on 02/26/2024 PCP: Pcp, No  Patient coming from: Home  Chief Complaint:  Chief Complaint  Patient presents with   Abdominal Pain    HPI: Travis Gallagher is a 89 y.o. male with medical history significant for Hypertension, AAA, choledocholithiasis s/p ERCP complicated by biliary leak, and subsequent cholecystectomy, recently admitted from 4/25 to 02/23/2024 with suspected sepsis secondary to pneumonia, being admitted with diffuse abdominal pain of uncertain etiology.  Patient presented with a several hour history of right lower quadrant pain radiating to his chest into his left side.  He had no vomiting, diarrhea, constipation, dysuria, fever or chills. ED course and data review:vitals unremarkable  Labs unrevealing, EKG non acute, CT abd and pelvis stable and non acute Treated with morphine , zofran  and pepcid  but continues to have discomfort  Due to patient's age obs requested     Review of Systems: As mentioned in the history of present illness. All other systems reviewed and are negative.  Past Medical History:  Diagnosis Date   AAA (abdominal aortic aneurysm) (HCC)    a. 02/2021 CT Abd: 3.3 x 2.7cm infrarenal AAA.   Bacteremia 02/2021   Cardiomyopathy (HCC)    a. 01/2021 Echo: Ef 45-50%, diff HK - worse @ septum and inf basal walls. Gr1 DD. Nl RV size/fxn. Mild to mod MR. Mild to mod Ao sclerosis. Mild AI. Asc Ao 40mm.   Choledocholithiasis    a. 02/2021 s/p chole. Post-op course complicatd by duodenal perforation and biliary leak req repeat lap.   Coronary artery calcification seen on CT scan    Dilated aortic root (HCC)    a. 02/2021 Echo: Asc Ao 40mm.   GERD (gastroesophageal reflux disease)    Hypertension    Intermittent RBBB    Orthostatic hypotension    Pancreatitis    PSVT (paroxysmal supraventricular tachycardia)  (HCC)    a. noted during 02/2021 hospitalization for pancreatitis-->low dose bb added.   Sepsis (HCC) 02/2021   Past Surgical History:  Procedure Laterality Date   CHOLECYSTECTOMY N/A 02/26/2021   Procedure: LAPAROSCOPIC CHOLECYSTECTOMY;  Surgeon: Enid Harry, MD;  Location: [facility] OR;  Service: General;  Laterality: N/A;  90 MINUTES ROOM 5   LAPAROTOMY N/A 03/01/2021   Procedure: EXPLORATORY LAPAROTOMY - PRIMARY REPAIR OF DUODENAL PERFORATION WITH GRAHAM PATCH;  INSERTION OF 19 FR. DRAIN;  Surgeon: Lujean Sake, MD;  Location: [facility] OR;  Service: General;  Laterality: N/A;   Social History:  reports that he has never smoked. He has never used smokeless tobacco. He reports that he does not currently use alcohol. He reports that he does not currently use drugs.  Allergies  Allergen Reactions   Quinolones     Ascending aorta and abdominal aneurysm -avoid use    Family History  Problem Relation Age of Onset   Diabetes Sister    Diabetes Brother     Prior to Admission medications   Medication Sig Start Date End Date Taking? Authorizing Provider  amoxicillin (AMOXIL) 500 MG capsule Take 2 capsules (1,000 mg total) by mouth 3 (three) times daily for 3 days. 02/24/24 02/27/24  Sheril Dines, MD  ASPIRIN ADULT LOW DOSE PO Take 100 mg by mouth as needed (pain).    [provider]  bisacodyl  (DULCOLAX) 10 MG suppository Place 10 mg rectally as needed for moderate constipation.    [provider]  bisoprolol (  ZEBETA) 5 MG tablet Take 2.5 mg by mouth daily as needed.    [provider]  enalapril (VASOTEC) 20 MG tablet Take 1 tablet (20 mg total) by mouth daily. 02/23/24   Sheril Dines, MD  feeding supplement (ENSURE ENLIVE / ENSURE PLUS) LIQD Take 237 mLs by mouth 3 (three) times daily between meals. Patient not taking: Reported on 02/21/2024 03/14/21   Gwendalyn Lemma, MD    Physical Exam: Vitals:   02/26/24 0130 02/26/24 0200 02/26/24 0211 02/26/24 0230  BP: (!)  148/59 (!) 141/58  (!) 153/63  Pulse: 81 80  80  Resp: 17   17  Temp:   98.3 F (36.8 C)   TempSrc:   Oral   SpO2: 92% 92%  93%  Weight:      Height:       Physical Exam Vitals and nursing note reviewed.  Constitutional:      General: He is not in acute distress. HENT:     Head: Normocephalic and atraumatic.  Cardiovascular:     Rate and Rhythm: Normal rate and regular rhythm.     Heart sounds: Normal heart sounds.  Pulmonary:     Effort: Pulmonary effort is normal.     Breath sounds: Normal breath sounds.  Abdominal:     Palpations: Abdomen is soft.     Tenderness: There is no abdominal tenderness.  Neurological:     Mental Status: Mental status is at baseline.     Labs on Admission: I have personally reviewed following labs and imaging studies  CBC: Recent Labs  Lab 02/20/24 2057 02/21/24 0437 02/22/24 0428 02/23/24 0433 02/25/24 2112  WBC 12.7* 11.7* 8.9 4.9 7.1  NEUTROABS 10.0*  --   --   --   --   HGB 13.7 11.5* 12.8* 11.4* 12.9*  HCT 39.4 33.7* 37.3* 32.5* 38.3*  MCV 85.5 87.5 86.9 85.5 88.2  PLT 111* 84* 79* 73* 134*   Basic Metabolic Panel: Recent Labs  Lab 02/20/24 2057 02/21/24 0437 02/22/24 0428 02/23/24 0433 02/25/24 2112  NA 133* 133* 135 137 139  K 3.8 3.9 3.9 3.7 3.5  CL 101 102 105 103 108  CO2 20* 23 26 25 25   GLUCOSE 149* 126* 98 98 107*  BUN 25* 20 23 26* 16  CREATININE 1.23 1.00 1.42* 1.22 0.97  CALCIUM 8.5* 7.9* 8.0* 7.9* 8.1*   GFR: Estimated Creatinine Clearance: 44.4 mL/min (by C-G formula based on SCr of 0.97 mg/dL). Liver Function Tests: Recent Labs  Lab 02/20/24 2057 02/25/24 2112  AST 29 34  ALT 15 24  ALKPHOS 63 60  BILITOT 1.3* 0.6  PROT 7.6 7.0  ALBUMIN  3.5 3.0*   Recent Labs  Lab 02/25/24 2112  LIPASE 50   No results for input(s): "AMMONIA" in the last 168 hours. Coagulation Profile: Recent Labs  Lab 02/20/24 2057  INR 1.1   Cardiac Enzymes: No results for input(s): "CKTOTAL", "CKMB",  "CKMBINDEX", "TROPONINI" in the last 168 hours. BNP (last 3 results) No results for input(s): "PROBNP" in the last 8760 hours. HbA1C: No results for input(s): "HGBA1C" in the last 72 hours. CBG: No results for input(s): "GLUCAP" in the last 168 hours. Lipid Profile: No results for input(s): "CHOL", "HDL", "LDLCALC", "TRIG", "CHOLHDL", "LDLDIRECT" in the last 72 hours. Thyroid Function Tests: No results for input(s): "TSH", "T4TOTAL", "FREET4", "T3FREE", "THYROIDAB" in the last 72 hours. Anemia Panel: No results for input(s): "VITAMINB12", "FOLATE", "FERRITIN", "TIBC", "IRON", "RETICCTPCT" in the last 72 hours. Urine analysis:  Component Value Date/Time   COLORURINE STRAW (A) 02/25/2024 2115   APPEARANCEUR CLEAR (A) 02/25/2024 2115   LABSPEC 1.005 02/25/2024 2115   PHURINE 7.0 02/25/2024 2115   GLUCOSEU NEGATIVE 02/25/2024 2115   HGBUR SMALL (A) 02/25/2024 2115   BILIRUBINUR NEGATIVE 02/25/2024 2115   KETONESUR NEGATIVE 02/25/2024 2115   PROTEINUR 100 (A) 02/25/2024 2115   NITRITE NEGATIVE 02/25/2024 2115   LEUKOCYTESUR NEGATIVE 02/25/2024 2115    Radiological Exams on Admission: DG Chest 2 View Result Date: 02/26/2024 CLINICAL DATA:  R sided chest pain EXAM: CHEST - 2 VIEW COMPARISON:  None Available. FINDINGS: The heart and mediastinal contours are unchanged. Atherosclerotic plaques. Elevated right hemidiaphragm. No focal consolidation. No pulmonary edema. Trace to small left pleural effusion. No pneumothorax. No acute osseous abnormality. IMPRESSION: 1. Trace to small left pleural effusion. 2.  Aortic Atherosclerosis (ICD10-I70.0). Electronically Signed   By: Morgane  Naveau M.D.   On: 02/26/2024 01:04   CT ABDOMEN PELVIS W CONTRAST Result Date: 02/26/2024 CLINICAL DATA:  RLQ abdominal pain EXAM: CT ABDOMEN AND PELVIS WITH CONTRAST TECHNIQUE: Multidetector CT imaging of the abdomen and pelvis was performed using the standard protocol following bolus administration of intravenous  contrast. RADIATION DOSE REDUCTION: This exam was performed according to the departmental dose-optimization program which includes automated exposure control, adjustment of the mA and/or kV according to patient size and/or use of iterative reconstruction technique. CONTRAST:  OMNIPAQUE  IOHEXOL  300 MG/ML  SOLN COMPARISON:  CT abdomen pelvis 02/20/2024 FINDINGS: Lower chest: Bilateral lower lobe atelectasis. Coronary artery calcification. No acute abnormality. Possible tiny hiatal hernia. Hepatobiliary: Numeral fluid density lesions within the liver is consistent with hepatic cysts. Status post hysterectomy. No biliary dilatation. Pancreas: No focal lesion. Normal pancreatic contour. No surrounding inflammatory changes. No main pancreatic ductal dilatation. Spleen: Normal in size. Fluid density lesions within the spleen likely cysts. Adrenals/Urinary Tract: No adrenal nodule bilaterally. Bilateral kidneys enhance symmetrically. No hydronephrosis. No hydroureter. The urinary bladder is unremarkable. On delayed imaging, there is no urothelial wall thickening and there are no filling defects in the opacified portions of the bilateral collecting systems or ureters. Stomach/Bowel: Stomach is within normal limits. No evidence of bowel wall thickening or dilatation. Appendix appears normal. Vascular/Lymphatic: Stable infrarenal abdominal aorta aneurysm (3.3 cm). Stable right common iliac artery aneurysm (3.7 cm). Severe atherosclerotic plaque of the aorta and its branches. No abdominal, pelvic, or inguinal lymphadenopathy. Reproductive: Prostate is unremarkable. Other: No intraperitoneal free fluid. No intraperitoneal free gas. No organized fluid collection. Musculoskeletal: No abdominal wall hernia or abnormality. No suspicious lytic or blastic osseous lesions. No acute displaced fracture. Multilevel degenerative changes of the spine. IMPRESSION: 1.  No acute intra-abdominal or intrapelvic abnormality. 2. Aortic  Atherosclerosis (ICD10-I70.0). 3. Stable infrarenal abdominal aorta aneurysm (3.3 cm). Stable right common iliac artery aneurysm (3.7 cm). Recommend follow-up ultrasound every 3 years. (Ref.: J Vasc Surg. 2018; 67:2-77 and J Am Coll Radiol 2013;10(10):789-794.). Aortic aneurysm NOS (ICD10-I71.9). Electronically Signed   By: Morgane  Naveau M.D.   On: 02/26/2024 00:45   Data Reviewed for HPI: Relevant notes from primary care and specialist visits, past discharge summaries as available in EHR, including Care Everywhere. Prior diagnostic testing as pertinent to current admission diagnoses Updated medications and problem lists for reconciliation ED course, including vitals, labs, imaging, treatment and response to treatment Triage notes, nursing and pharmacy notes and ED provider's notes Notable results as noted above in HPI      Assessment and Plan: * Abdominal pain Abd pain  radiating to chest in the setting of recent hospitalization Uncertain etiology Pain control, antiemetics with reassessment Will get D dimer   Chronic Constipation History of cholecystectomy complicated by duodenal perforation and bile leak, 2022 No evidence of bowel obstruction or other acute intra-abdominal/intrapelvic abnormality Docusate daily with Dulcolax as needed   Aneurysm of right common iliac artery (HCC) AAA, stable at 3.3 cm CT abdomen and pelvis showing common iliac artery aneurysm 3.7 cm increased from 3 cm with recommendation to vascular Outpatient referral at discharge   History of PSVT (paroxysmal supraventricular tachycardia) (HCC) Continue carvedilol    Hypertension Continue carvedilol     DVT prophylaxis: Lovenox   Consults: none  Advance Care Planning:   Code Status: Prior   Family Communication: daughter in law at bedside  Disposition Plan: Back to previous home environment  Severity of Illness: The appropriate patient status for this patient is OBSERVATION. Observation status is  judged to be reasonable and necessary in order to provide the required intensity of service to ensure the patient's safety. The patient's presenting symptoms, physical exam findings, and initial radiographic and laboratory data in the context of their medical condition is felt to place them at decreased risk for further clinical deterioration. Furthermore, it is anticipated that the patient will be medically stable for discharge from the hospital within 2 midnights of admission.   Author: Lanetta Pion, MD 02/26/2024 3:13 AM  For on call review [email or web address].

## 2024-02-26 NOTE — Assessment & Plan Note (Addendum)
 Abd pain radiating to chest in the setting of recent hospitalization Uncertain etiology Pain control, antiemetics with reassessment Will get D dimer

## 2024-02-26 NOTE — Hospital Course (Signed)
 Travis Gallagher

## 2024-02-26 NOTE — ED Notes (Addendum)
 Assumed care of pt at this time. Pt awaiting inpatient bed. Family at bedside. Pt denies needs at this time. Call light within reach. Upon assessment, pt reports pain in RLQ radiates up into chest, denies shortness of breath. Pt unsure of when pain radiated to chest but "sometime today". EKG performed and signed by ED provider, Ward, MD. Vallarie Gauze, MD notified of above.
